# Patient Record
Sex: Female | Born: 1955
Health system: Southern US, Community
[De-identification: ages and names within clinical notes are randomized; demographics above are authoritative.]

## PROBLEM LIST (undated history)

## (undated) DIAGNOSIS — S42409A Unspecified fracture of lower end of unspecified humerus, initial encounter for closed fracture: Secondary | ICD-10-CM

## (undated) DIAGNOSIS — E785 Hyperlipidemia, unspecified: Secondary | ICD-10-CM

## (undated) DIAGNOSIS — R112 Nausea with vomiting, unspecified: Secondary | ICD-10-CM

## (undated) DIAGNOSIS — T8859XA Other complications of anesthesia, initial encounter: Secondary | ICD-10-CM

## (undated) DIAGNOSIS — I10 Essential (primary) hypertension: Secondary | ICD-10-CM

## (undated) DIAGNOSIS — N83209 Unspecified ovarian cyst, unspecified side: Secondary | ICD-10-CM

## (undated) DIAGNOSIS — N870 Mild cervical dysplasia: Secondary | ICD-10-CM

## (undated) DIAGNOSIS — R002 Palpitations: Secondary | ICD-10-CM

## (undated) DIAGNOSIS — Z9889 Other specified postprocedural states: Secondary | ICD-10-CM

## (undated) DIAGNOSIS — R42 Dizziness and giddiness: Secondary | ICD-10-CM

## (undated) DIAGNOSIS — T4145XA Adverse effect of unspecified anesthetic, initial encounter: Secondary | ICD-10-CM

## (undated) DIAGNOSIS — M1990 Unspecified osteoarthritis, unspecified site: Secondary | ICD-10-CM

## (undated) DIAGNOSIS — R55 Syncope and collapse: Secondary | ICD-10-CM

## (undated) HISTORY — DX: Mild cervical dysplasia: N87.0

## (undated) HISTORY — DX: Syncope and collapse: R55

## (undated) HISTORY — PX: COLPOSCOPY: SHX161

## (undated) HISTORY — PX: ABDOMINAL HYSTERECTOMY: SHX81

## (undated) HISTORY — DX: Unspecified ovarian cyst, unspecified side: N83.209

## (undated) HISTORY — DX: Hyperlipidemia, unspecified: E78.5

## (undated) HISTORY — PX: TUBAL LIGATION: SHX77

## (undated) HISTORY — DX: Palpitations: R00.2

## (undated) HISTORY — DX: Unspecified osteoarthritis, unspecified site: M19.90

## (undated) HISTORY — PX: OOPHORECTOMY: SHX86

## (undated) HISTORY — DX: Essential (primary) hypertension: I10

## (undated) HISTORY — PX: ABDOMINAL SURGERY: SHX537

## (undated) HISTORY — PX: GYNECOLOGIC CRYOSURGERY: SHX857

---

## 1998-09-06 ENCOUNTER — Emergency Department (HOSPITAL_COMMUNITY): Admission: EM | Admit: 1998-09-06 | Discharge: 1998-09-06 | Payer: Self-pay | Admitting: Emergency Medicine

## 2000-10-24 ENCOUNTER — Encounter: Payer: Self-pay | Admitting: Internal Medicine

## 2000-10-24 ENCOUNTER — Encounter: Admission: RE | Admit: 2000-10-24 | Discharge: 2000-10-24 | Payer: Self-pay | Admitting: Internal Medicine

## 2002-11-22 ENCOUNTER — Other Ambulatory Visit: Admission: RE | Admit: 2002-11-22 | Discharge: 2002-11-22 | Payer: Self-pay | Admitting: Obstetrics and Gynecology

## 2004-06-28 ENCOUNTER — Encounter: Admission: RE | Admit: 2004-06-28 | Discharge: 2004-08-27 | Payer: Self-pay | Admitting: Sports Medicine

## 2008-11-27 ENCOUNTER — Emergency Department (HOSPITAL_COMMUNITY): Admission: EM | Admit: 2008-11-27 | Discharge: 2008-11-28 | Payer: Self-pay | Admitting: Emergency Medicine

## 2011-01-09 ENCOUNTER — Encounter: Payer: Self-pay | Admitting: Sports Medicine

## 2011-10-21 ENCOUNTER — Ambulatory Visit (INDEPENDENT_AMBULATORY_CARE_PROVIDER_SITE_OTHER): Payer: BC Managed Care – PPO | Admitting: Women's Health

## 2011-10-21 ENCOUNTER — Encounter: Payer: Self-pay | Admitting: Women's Health

## 2011-10-21 ENCOUNTER — Other Ambulatory Visit (HOSPITAL_COMMUNITY)
Admission: RE | Admit: 2011-10-21 | Discharge: 2011-10-21 | Disposition: A | Discharge status: Home / Self Care | Payer: BC Managed Care – PPO | Source: Ambulatory Visit | Attending: Women's Health | Admitting: Women's Health

## 2011-10-21 VITALS — BP 110/64 | Ht 66.5 in | Wt 114.0 lb

## 2011-10-21 DIAGNOSIS — Z01419 Encounter for gynecological examination (general) (routine) without abnormal findings: Secondary | ICD-10-CM

## 2011-10-21 DIAGNOSIS — N952 Postmenopausal atrophic vaginitis: Secondary | ICD-10-CM

## 2011-10-21 DIAGNOSIS — N870 Mild cervical dysplasia: Secondary | ICD-10-CM | POA: Insufficient documentation

## 2011-10-21 DIAGNOSIS — N83209 Unspecified ovarian cyst, unspecified side: Secondary | ICD-10-CM | POA: Insufficient documentation

## 2011-10-21 MED ORDER — ESTRADIOL 10 MCG VA TABS: 10.0000 ug | ORAL_TABLET | VAGINAL | Status: DC

## 2011-10-21 NOTE — Progress Notes (Signed)
JAMIRAH ZELAYA [birth year] [id number]    History:    The patient presents for annual exam.  Teacher at [employer].   Past medical history, past surgical history, family history and social history were all reviewed and documented in the EPIC chart.   ROS:  A  ROS was performed and pertinent positives and negatives are included in the history.  Exam:  Filed Vitals:   10/21/11 1401  BP: 110/64    General appearance:  Normal Head/Neck:  Normal, without cervical or supraclavicular adenopathy. Thyroid:  Symmetrical, normal in size, without palpable masses or nodularity. Respiratory  Effort:  Normal  Auscultation:  Clear without wheezing or rhonchi Cardiovascular  Auscultation:  Regular rate, without rubs, murmurs or gallops  Edema/varicosities:  Not grossly evident Abdominal  Soft,nontender, without masses, guarding or rebound.  Liver/spleen:  No organomegaly noted  Hernia:  None appreciated  Skin  Inspection:  Grossly normal  Palpation:  Grossly normal Neurologic/psychiatric  Orientation:  Normal with appropriate conversation.  Mood/affect:  Normal  Genitourinary    Breasts: Examined lying and sitting.     Right: Without masses, retractions, discharge or axillary adenopathy.     Left: Without masses, retractions, discharge or axillary adenopathy.   Inguinal/mons:  Normal without inguinal adenopathy  External genitalia:  Normal  BUS/Urethra/Skene's glands:  Normal  Bladder:  Normal  Vagina:  Atrophic  Cervix:  Absent  Uterus:  Absent  Adnexa/parametria:     Rt: Without masses but with tenderness.   Lt: Without masses or tenderness.  Anus and perineum: Normal  Digital rectal exam: Normal sphincter tone without palpated masses or tenderness  Assessment/Plan:  55 y.o. MWF G0 for annual exam. Postmenopausal on no ERT with a complaint of vaginal dryness. Hysterectomy in 85, and oophorectomy in 89 for cysts. Bone density in January of 2011, states low end of normal, primary care  manages/exercise and vitamin D 2000 daily. Negative colonoscopy at age 33.  Vaginal atrophy  Plan: Options reviewed for atrophy will try Vagifem 1 applicator at bedtime for 2 weeks and then twice weekly.  Reviewed slight risk for blood clots and strokes, did review minimal systemic absorption. Encouraged lubricant for intercourse. SBEs, annual mammogram which she states have been normal, continue exercise, she is a runner, calcium rich diet, vitamin D 2000 daily encouraged. Pap only today, labs at primary care which she states were all normal.    [facility], 3:58 PM 10/21/2011

## 2013-01-27 ENCOUNTER — Ambulatory Visit (INDEPENDENT_AMBULATORY_CARE_PROVIDER_SITE_OTHER): Payer: BC Managed Care – PPO | Admitting: Family Medicine

## 2013-01-27 VITALS — BP 146/75 | HR 75 | Temp 98.6°F | Resp 18 | Ht 66.0 in | Wt 121.6 lb

## 2013-01-27 DIAGNOSIS — M531 Cervicobrachial syndrome: Secondary | ICD-10-CM

## 2013-01-27 DIAGNOSIS — M5481 Occipital neuralgia: Secondary | ICD-10-CM

## 2013-01-27 LAB — POCT SEDIMENTATION RATE: POCT SED RATE: 20 mm/hr (ref 0–22)

## 2013-01-27 MED ORDER — PREDNISONE 10 MG PO TABS: ORAL_TABLET | ORAL | Status: DC

## 2013-01-27 NOTE — Progress Notes (Signed)
Subjective:    Patient ID: Rita Berry, female    DOB: 09/16/1956, 57 y.o.   MRN: [id number] Chief Complaint  Patient presents with  . Headache    2 weeks  . Muscle Pain   HPI  Last Fri - 9d prev she fell asleep with the base of her right head on the tip of her husbands shoulder.   After she woke up she was had sharp shoulder pain and numbness in the back of her right ear which persisted x 2d. Then developed into severe point tenderness in right occiptal scalp radiating upwards - on fire.  Takes baby aspirin 81mg daily and icing.  She had prednisone 20mg  at home - and she cut it into tiny piece so estimates she took about 3 mg and it did improve some.  She has done a lot of research and is concerned she has occiptal neuralgia.  It is not a HA so much as it is point tenderness much worse to touch.  Takes a Production designer, theatre/television/film of lysine - to prevent shingles like outbreaks  bp checks at home daily 117/60s normally but she has white coat and prednisone increases.  Has a h/o BPPV  Past Medical History  Diagnosis Date  . CIN I (cervical intraepithelial neoplasia I)   . Ovarian cyst    Current Outpatient Prescriptions on File Prior to Visit  Medication Sig Dispense Refill  . Calcium-Magnesium-Vitamin D (CALCIUM MAGNESIUM PO) Take by mouth.        . Cholecalciferol (VITAMIN D PO) Take 2,000 Units by mouth.        . Multiple Vitamin (MULTIVITAMIN) tablet Take 1 tablet by mouth daily.        . Estradiol (VAGIFEM) 10 MCG TABS Place 1 tablet (10 mcg total) vaginally 2 (two) times a week.  8 tablet  12   No current facility-administered medications on file prior to visit.   Allergies  Allergen Reactions  . Codeine      Review of Systems  Constitutional: Negative for fever, chills, diaphoresis, appetite change and unexpected weight change.  HENT: Positive for neck pain and neck stiffness. Negative for hearing loss, ear pain, congestion, rhinorrhea, dental problem, sinus pressure, tinnitus and ear  discharge.   Eyes: Positive for photophobia. Negative for pain, discharge and visual disturbance.  Gastrointestinal: Negative for nausea and vomiting.  Musculoskeletal: Positive for myalgias. Negative for joint swelling and gait problem.  Skin: Negative for pallor, rash and wound.  Neurological: Positive for numbness and headaches. Negative for dizziness, tremors, seizures, syncope, facial asymmetry, speech difficulty, weakness and light-headedness.  Psychiatric/Behavioral: Positive for sleep disturbance.      BP 146/75  Pulse 75  Temp(Src) 98.6 F (37 C) (Oral)  Resp 18  Ht 5\' 6"  (1.676 m)  Wt 121 lb 9.6 oz (55.157 kg)  BMI 19.64 kg/m2  SpO2 96% Objective:   Physical Exam  Constitutional: She is oriented to person, place, and time. She appears well-developed and well-nourished. No distress.  HENT:  Head: Normocephalic and atraumatic.  Right Ear: External ear normal.  Left Ear: External ear normal.  Eyes: Conjunctivae are normal. No scleral icterus.  Neck: Normal range of motion. Neck supple. No thyromegaly present.  Cardiovascular: Normal rate, regular rhythm, normal heart sounds and intact distal pulses.   Pulmonary/Chest: Effort normal and breath sounds normal. No respiratory distress.  Musculoskeletal: She exhibits no edema.  Severe point tenderness immed anterior to right occipital groove on scalp  Lymphadenopathy:    She has  no cervical adenopathy.  Neurological: She is alert and oriented to person, place, and time.  Skin: Skin is warm and dry. She is not diaphoretic. No erythema.  Psychiatric: She has a normal mood and affect. Her behavior is normal.      Assessment & Plan:   1. Occipital neuralgia  POCT SEDIMENTATION RATE   POCT SEDIMENTATION RATE   predniSONE (DELTASONE) 10 MG tablet   Hopefully a one time thing from prolonged pressure against nerve but if recurs, rec referral to neurology to consider injections  Meds ordered this encounter  Medications  .  predniSONE (DELTASONE) 10 MG tablet    Sig: Take 4 tabs po qd x 2d, 3 tabs po qd x 2d, 2 tabs po qd x 2d, 1 tab po qd x 2d    Dispense:  20 tablet    Refill:  0

## 2013-01-27 NOTE — Patient Instructions (Addendum)
Occipital Neuralgia Neuralgias are attacks of sharp stabbing pain. They may be intermittent (comes and goes) or constant in nature. They may be brief attacks that last seconds to minutes and may come back for days to weeks. The neuralgias can occur as a result of a herpes zoster (shingles), chickenpox infection, or even following a herpes simplex infection (cold sore). TYPES OF NEURALGIA  When these pains are located in the back of the head and neck they are called occipital neuralgias.  When the pain is located between ribs it is called intercostal neuralgia.  When the pain is located in the face it is called trigeminal neuralgia. This is the most common neuralgia. It causes sharp, shock like pain on one side of your face. The neuralgias, which follow herpes zoster infections, often produce a constant burning pain. They may last from weeks to months and even years. The attacks of pain may come from injury or inflammation (irritation) to a nerve. Often the cause is unknown. The episodes of pain may be caused by light touch, movement, or even eating and sneezing. Usually these neuralgias occur after age forty. The neuralgias following shingles and trigeminal neuralgia are the most common. Although painful, these episodes do not threaten life and tend to lessen as we grow older. TREATMENT  There are many medications that may be helpful in the treatment of this disorder. Sometimes several medications may have to be tried before the right combination can be found for you. Some of these medications are:  Only take over-the-counter or prescription medications for pain, discomfort, or fever as directed by your caregiver.  Narcotic medications may be used to control the pain.  Antidepressants and medications used in epilepsy (seizure disorders) may be useful. LET YOUR CAREGIVER KNOW ABOUT:  If you do not obtain relief from medications.  Problems that are getting worse rather than better.  Troubling  side effects that you think are coming from the medication. Do not be discouraged if you do not obtain instant relief from the medications or help given you. Your caregiver can help you get through these episodes of pain with some persistence (continued trying) on your part also. Document Released: 11/29/2001 Document Revised: 02/27/2012 Document Reviewed: 12/05/2005 ExitCare Patient Information 2013 [employer].  

## 2013-02-02 ENCOUNTER — Other Ambulatory Visit: Payer: Self-pay

## 2013-04-18 ENCOUNTER — Telehealth: Payer: Self-pay

## 2013-04-18 DIAGNOSIS — Z1231 Encounter for screening mammogram for malignant neoplasm of breast: Secondary | ICD-10-CM

## 2013-04-18 DIAGNOSIS — M8580 Other specified disorders of bone density and structure, unspecified site: Secondary | ICD-10-CM

## 2013-04-18 DIAGNOSIS — E215 Disorder of parathyroid gland, unspecified: Secondary | ICD-10-CM

## 2013-04-18 NOTE — Telephone Encounter (Signed)
Patient would like referral to [facility] for a diagnostic mammogram and bone density test

## 2013-04-18 NOTE — Telephone Encounter (Signed)
You are listed as PCP can you do this?

## 2013-04-19 DIAGNOSIS — M8580 Other specified disorders of bone density and structure, unspecified site: Secondary | ICD-10-CM | POA: Insufficient documentation

## 2013-04-19 DIAGNOSIS — E213 Hyperparathyroidism, unspecified: Secondary | ICD-10-CM | POA: Insufficient documentation

## 2013-04-19 NOTE — Telephone Encounter (Signed)
Spoke to her, she is not having problems currently but 2 yrs ago she had abnormal mammogram, was told she needs diagnostic mammogram. Her last mammogram was normal. Please advise. Pended diagnostic mammogram

## 2013-04-19 NOTE — Telephone Encounter (Signed)
Called her, left message for her to call me back.  

## 2013-04-19 NOTE — Telephone Encounter (Addendum)
Please call this patient and find out why she needs a DIAGNOSTIC mammogram, rather than screening. I've ordered the DEXA.

## 2013-04-19 NOTE — Telephone Encounter (Signed)
Per the last mammogram (the most recent one in her paper chart is DIAGNOSTIC 07/07/2011, and was compared to 12/06/2010 and 12/23/2010), she was to have a SCREENING mammogram in 11/2011. Has she had a mammogram since 06/2011?  If not, this should be SCREENING.  Also, I note that her previous mammograms have been done at [facility], so we should schedule her there, and move the DEXA there, too.  Thanks.

## 2013-04-22 NOTE — Telephone Encounter (Signed)
No, not scheduled yet..patients last mammogram was 11/2011 she was advised this was good, has not gone yet. Pended the mammogram order and the bone density to be done together at [facility]

## 2013-04-22 NOTE — Telephone Encounter (Signed)
Where are we on this?

## 2013-05-24 ENCOUNTER — Ambulatory Visit: Payer: BC Managed Care – PPO

## 2013-05-24 ENCOUNTER — Ambulatory Visit (INDEPENDENT_AMBULATORY_CARE_PROVIDER_SITE_OTHER): Payer: BC Managed Care – PPO | Admitting: Family Medicine

## 2013-05-24 VITALS — BP 122/68 | HR 72 | Temp 97.7°F | Resp 16 | Ht 66.25 in | Wt 126.8 lb

## 2013-05-24 DIAGNOSIS — M25569 Pain in unspecified knee: Secondary | ICD-10-CM

## 2013-05-24 DIAGNOSIS — M899 Disorder of bone, unspecified: Secondary | ICD-10-CM

## 2013-05-24 DIAGNOSIS — M8580 Other specified disorders of bone density and structure, unspecified site: Secondary | ICD-10-CM

## 2013-05-24 DIAGNOSIS — M25562 Pain in left knee: Secondary | ICD-10-CM

## 2013-05-24 NOTE — Patient Instructions (Addendum)
1.  DECREASE LENGTH AND INTENSITY OF RUNNING FOR NEXT 2-4 WEEKS. 2.  ICE LEG TWICE DAILY.

## 2013-05-24 NOTE — Progress Notes (Signed)
[address]   [place], [place]  [address]   [phone]  Subjective:    Patient ID: Rita Berry, female    DOB: [birth year], 57 y.o.   MRN: [id number]  HPI This 57 y.o. female presents for evaluation of lower leg pain.  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  In 03/2010, was running 7 miles; jumped over a log; LLE injured; no evaluation; felt likely a stress fracture or significant fracture. History of recurrent stress fractures.  Wrapped leg, stopped running for several months after initial injury.  Rode bike entire summer instead of running.  School is out and has increased mileage recently; only running trails.  History of osteopenia.  Taking vitamin D; MVI; variety of calcium; supplement of calcium.  Almond milk.  Summer has just started.  Currently running 20-25 miles per week on trails only.  Does not run really hard.  Works at [employer] as Therapist, art.  Onset of worsening pain this week.  In same location as traumatic injury; can not reproduce pain with palpation or calf squeeze.  Impact of running makes pain worse.  Still wraps LLE: wears shin.  Feels like previous shin splints. Has had chronic LLE pain since traumatic injury three years ago.  Presenting for xray to evaluate for previous injury as well as acute stress fracture. No associated knee or ankle pain.  No foot pain.   Exercises daily.     Review of Systems  Constitutional: Negative for fever, chills, diaphoresis and fatigue.  Musculoskeletal: Positive for myalgias. Negative for back pain, joint swelling, arthralgias and gait problem.  Neurological: Negative for weakness and numbness.    Past Medical History  Diagnosis Date  . CIN I (cervical intraepithelial neoplasia I)   . Ovarian cyst     Past Surgical History  Procedure Laterality Date  . Gynecologic cryosurgery    . Colposcopy    . Abdominal hysterectomy    . Abdominal surgery      Ovarian cyst  .  Oophorectomy      BSO  . Tubal ligation      Prior to Admission medications   Medication Sig Start Date End Date Taking? Authorizing Provider  Calcium-Magnesium-Vitamin D (CALCIUM MAGNESIUM PO) Take by mouth.     Yes Historical Provider, MD  Cholecalciferol (VITAMIN D PO) Take 2,000 Units by mouth.     Yes Historical Provider, MD  Multiple Vitamin (MULTIVITAMIN) tablet Take 1 tablet by mouth daily.     Yes Historical Provider, MD    Allergies  Allergen Reactions  . Codeine     History   Social History  . Marital Status: Married    Spouse Name: N/A    Number of Children: N/A  . Years of Education: N/A   Occupational History  . Not on file.   Social History Main Topics  . Smoking status: Former Games developer  . Smokeless tobacco: Not on file  . Alcohol Use: 2.0 oz/week    4 drink(s) per week  . Drug Use: Not on file  . Sexually Active: Yes    Birth Control/ Protection: Surgical   Other Topics Concern  . Not on file   Social History Narrative  . No narrative on file    Family History  Problem Relation Age of Onset  . Hypertension Mother   . Hypertension Father   . Diabetes Sister   . Diabetes Maternal Grandmother   . Stroke  Maternal Grandmother   . Stroke Maternal Grandfather        Objective:   Physical Exam  Nursing note and vitals reviewed. Constitutional: She appears well-developed and well-nourished. No distress.  Musculoskeletal:       Left knee: Normal.       Left lower leg: Normal. She exhibits no tenderness, no bony tenderness, no swelling, no edema, no deformity and no laceration.  Skin: She is not diaphoretic.   [facility] reading (PRIMARY) by  Dr. Katrinka Blazing.  L tib-fib films: NAD     Assessment & Plan:  Pain in joint, lower leg, left - Plan: DG Tibia/Fibula Left  Osteopenia  1.  L lower leg pain:  Chronic with recent worsening; onset after fall/injury three years ago.  2.  Contusion L leg:  Chronic with recent worsening with increased exercise;  recommend rest, ice, elevate.  Pt declined rx for NSAID. Refer to sports medicine due to chronic nature of pain. 3.  Osteopenia: stable; scheduled for bone density in July 2014; history of recurrent stress fractures.

## 2013-05-29 ENCOUNTER — Ambulatory Visit: Payer: BC Managed Care – PPO | Admitting: Sports Medicine

## 2013-06-18 ENCOUNTER — Ambulatory Visit (INDEPENDENT_AMBULATORY_CARE_PROVIDER_SITE_OTHER): Payer: BC Managed Care – PPO | Admitting: Sports Medicine

## 2013-06-18 VITALS — BP 132/89 | Ht 67.0 in | Wt 125.0 lb

## 2013-06-18 DIAGNOSIS — R269 Unspecified abnormalities of gait and mobility: Secondary | ICD-10-CM

## 2013-06-18 NOTE — Assessment & Plan Note (Addendum)
Internal rotation of the tibias (L>R) Likely contributing to her h/o stress fractures. Focus on quad strengthening once a week. - nice to add some biking once per week and watch total run/walk time Work on not crossing the midline when running. / form drills advised Follow up as needed.  Follow RT hip for sxs - shows early signs of DJD on exam

## 2013-06-18 NOTE — Progress Notes (Signed)
Patient ID: Rita Berry, female   DOB: 28-Oct-1956, 57 y.o.   MRN: [id number] Subjective: Rita Berry is here for a new patient appointment for shin pain. Referred courtesy of Dr Rita Berry.  She reports that currently the pain has completely resolved.  2 years ago, she developed a stress fracture of her left shin.  She took 6-8 weeks off running and it improved.  She then developed shin splints on the right.  She wore a compression sleeve on the right, which improved the shin splint pain, so she got a second one to wear on the left as well.  She was been wearing the compression sleeves with any weight bearing activity for the last 1.5 years.  She describes continued left shin pain during this time frame.  She had an x-ray last month which was normal.  She stopped wearing the compression sleeves per the recommendation of her doctor and since then, she has been pain free.  Currently running 3 miles a day and walking an additional 5 miles.  PMHx: rickets as child PSHx: none FHx: none SHx: non smoker; occasional alcohol, denies drugs; works as professor of kinesiology  Medications: none Allergies: codeine  Objective: BP 132/89  Ht 5\' 7"  (1.702 m)  Wt 125 lb (56.7 kg)  BMI 19.57 kg/m2 Gen: alert, cooperative, NAD Shins: no tenderness to palpation, no erythema, warmth or swelling; slight internal rotation of tibia bilaterally Hip: Poor definition of bilateral vastus medius ROM Limited in rotation on right hip Strength Flexion: 5/5, Abduction: 5/5 Standing hip rotation and gait without trendelenburg sign / unsteadiness.  Gait: crossing of midline with left foot consistently with compensatory winging of left arm Consistent IR of Lt foot  A/P: 57 yo woman with history of rickets and stress fracture, found to have gait abnormality. See problem list for specifics.

## 2013-06-18 NOTE — Patient Instructions (Addendum)
Please do quad specific activity once a week. Every so often, watch how your feet move when you run.  You don't want to have your feet cross your body. Follow up if needed.

## 2013-06-21 ENCOUNTER — Encounter: Payer: Self-pay | Admitting: Physician Assistant

## 2013-06-25 ENCOUNTER — Encounter: Payer: Self-pay | Admitting: Physician Assistant

## 2013-06-27 ENCOUNTER — Ambulatory Visit: Payer: BC Managed Care – PPO | Admitting: Sports Medicine

## 2013-06-27 ENCOUNTER — Telehealth: Payer: Self-pay | Admitting: Radiology

## 2013-06-27 ENCOUNTER — Encounter: Payer: Self-pay | Admitting: Radiology

## 2013-06-27 NOTE — Telephone Encounter (Signed)
Called patient per Dr Katrinka Blazing mammogram normal.

## 2013-09-04 ENCOUNTER — Ambulatory Visit (INDEPENDENT_AMBULATORY_CARE_PROVIDER_SITE_OTHER): Payer: BC Managed Care – PPO | Admitting: Sports Medicine

## 2013-09-04 VITALS — BP 110/74 | Ht 67.0 in | Wt 128.0 lb

## 2013-09-04 DIAGNOSIS — M533 Sacrococcygeal disorders, not elsewhere classified: Secondary | ICD-10-CM

## 2013-09-04 NOTE — Progress Notes (Signed)
  Reason for visit: Right buttocks pain  Subjective: Patient is a very pleasant 57 year old female L runner who presents with right posterior hip and gluteal pain.  She states that she has had this pain for the last couple months.  She describes pain that is in her buttocks and posterior hip.  She actually thinks that the pain improves as she gets moving during her runs.  She has the most pain after she gets up from sitting or laying down for a long period of time.  She has tried bio frees as well as piriformis stretching.  She is not tried any medications.  She does ice every night.  She continues to run 4-5 miles 3 days per week and works out on the elliptical the other days.  She denies any radicular pain into her leg, numbness, weakness.  Review of systems as related to current complaint reviewed and unchanged.  Objective:The patient is well-developed well-nourished female and in no acute distress.  She is awake alert and oriented x3.  Extraocular motion is intact.  No use of accessory muscles for breathing.  No skin rash. Right hip:  - Full range of motion of the hip without pain. - Patient is noted to have asymmetry of her pelvis with the left hemipelvis 1 inch higher than the right.  Decreased motion and SI joint. - Weakness in hip abduction testing of gluteus maximus and gluteus medius. - Fabers test creates pain as well as decreased mobility on the right as compared to the left. - Pretzels stretch shows significant tightness of the right SI as compared to the left. - On gait testing patient has chicken winging of the right arm.  Assessment: #1.  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  Plan: Patient was given home exercises to do for SI joint mobilization as well as pelvic strengthening.  She was also given hip abduction and rotator strengthening exercises.  She was asked to focus on the pretzels stretch as well as strengthening.  She  may continue to run as tolerated.  She was also asked to practice her running form on a straight line with video taping to focus on form.  Followup in 6 weeks.

## 2013-09-04 NOTE — Patient Instructions (Signed)
Thank you for coming in today  1. Really work on pretzel stretch 2. Do low back and hip exercises 3. Work on running on a straight line and with videotaping 4. Followup 6 weeks if needed

## 2013-10-24 ENCOUNTER — Other Ambulatory Visit: Payer: Self-pay

## 2013-10-30 ENCOUNTER — Ambulatory Visit (INDEPENDENT_AMBULATORY_CARE_PROVIDER_SITE_OTHER): Payer: BC Managed Care – PPO | Admitting: Sports Medicine

## 2013-10-30 ENCOUNTER — Encounter: Payer: Self-pay | Admitting: Sports Medicine

## 2013-10-30 VITALS — Ht 67.0 in | Wt 125.0 lb

## 2013-10-30 DIAGNOSIS — M7632 Iliotibial band syndrome, left leg: Secondary | ICD-10-CM | POA: Insufficient documentation

## 2013-10-30 DIAGNOSIS — M629 Disorder of muscle, unspecified: Secondary | ICD-10-CM

## 2013-10-30 DIAGNOSIS — M76899 Other specified enthesopathies of unspecified lower limb, excluding foot: Secondary | ICD-10-CM

## 2013-10-30 DIAGNOSIS — R269 Unspecified abnormalities of gait and mobility: Secondary | ICD-10-CM

## 2013-10-30 DIAGNOSIS — M7062 Trochanteric bursitis, left hip: Secondary | ICD-10-CM | POA: Insufficient documentation

## 2013-10-30 DIAGNOSIS — M7061 Trochanteric bursitis, right hip: Secondary | ICD-10-CM | POA: Insufficient documentation

## 2013-10-30 MED ORDER — NITROGLYCERIN 0.2 MG/HR TD PT24: MEDICATED_PATCH | TRANSDERMAL | Status: DC

## 2013-10-30 NOTE — Assessment & Plan Note (Signed)
She is improving her gait  Continue to work on foot placement  Avoid turning the left foot in if possible

## 2013-10-30 NOTE — Progress Notes (Signed)
  Subjective:    Patient ID: Rita Berry, female    DOB: [birth year], 57 y.o.   MRN: [id number]  HPI  Pt presents to clinic for evaluation of lt lateral hip pain that has been painful for about 6 weeks.  Pain is the worst after prolonged activity and after sitting.  She is not sure if she hurt this with running or with some weight lifting activity Has a constant aching- 3/10. 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 Does running, walking, and strength training.  PHD in kinesiology  Doing IT band stretches which help.  Stretching after warming up for a run helps.   Of note is that her gait and right SI joint symptoms have improved a lot since we last saw her  she continues to do some specific exercises for her hip abduction and rotation  Past history is significant for parathyroid disease which is associated with increased risk of tendon and bursal injuries    Review of Systems     Objective:   Physical Exam No acute distress, thin and athletic  ttp over lt greater trochanter Weakness and some pain with testing the tensor fascia lata left only Left hip abduction was strong but cause mild pain Left hip extension with abduction cause pain and was mildly weak   70 deg ER lt hip 30 deg IR lt hip  Right hip has about 60 of external rotation and about 15 of internal rotation  Running gait is improved but she still turned to left foot in somewhat She has slight hesitance with hip flexion on the left  Musculoskeletal ultrasound There is a small spur at the left greater trochanter with hypoechoic fluid around this There seems to be bursal swelling under the attachment of the tensor fascia lata There is no significant swelling under the attachment of the gluteus medius or hip rotators There is mild swelling and bursal type appearance under the tendon of a gluteus maximus coming into the iliotibial band at the greater trochanter             Assessment & Plan:

## 2013-10-30 NOTE — Assessment & Plan Note (Signed)
this not painful enough for her to use injection  Ice massage a daily basis  Home exercise program  Rescanning in 2 months  Trial with nitroglycerin protocol

## 2013-10-30 NOTE — Assessment & Plan Note (Signed)
We gave her a series of strength exercises to focus on the muscles attaching to the iliotibial band  She will continue the stretches

## 2013-10-30 NOTE — Patient Instructions (Signed)
Do ice massages for 5-10 minutes over sore area of left hip twice daily  Straight leg extensions to the back, and to the back and out to the side  Side leg lifts lying on your side Flexion and abduction of your hip  Try nitroglycerin 1/4 patch over affected area  Nitroglycerin Protocol   Apply 1/4 nitroglycerin patch to affected area daily.  Change position of patch within the affected area every 24 hours.  You may experience a headache during the first 1-2 weeks of using the patch, these should subside.  If you experience headaches after beginning nitroglycerin patch treatment, you may take your preferred over the counter pain reliever.  Another side effect of the nitroglycerin patch is skin irritation or rash related to patch adhesive.  Please notify our office if you develop more severe headaches or rash, and stop the patch.  Tendon healing with nitroglycerin patch may require 12 to 24 weeks depending on the extent of injury.  Men should not use if taking Viagra, Cialis, or Levitra.   Do not use if you have migraines or rosacea.   Please follow up in 6 weeks  Thank you for seeing [place] today!

## 2013-11-12 ENCOUNTER — Telehealth: Payer: Self-pay | Admitting: *Deleted

## 2013-11-12 NOTE — Telephone Encounter (Signed)
Pt returned call.  For the 1st 10 days after starting NTG patch hip felt much better.  Went for a 6 mile run, and noticed hip pain worsened.  Advised pt she may have done too much activity too soon.  Suggested that she continue NTG patch, and ease back into activity.  Not going over a 3/10 on pain scale during activity.  Also asked pt to schedule a f/u with Dr. Darrick Penna as she did not have one set up yet.

## 2013-11-12 NOTE — Telephone Encounter (Signed)
Message copied by Mora Bellman on Tue Nov 12, 2013 12:01 PM ------      Message from: Lizbeth Bark      Created: Tue Nov 12, 2013  8:10 AM      Regarding: phone message      Contact: [phone]       Pt left vm asking for a call back to discuss nitroglycerin patch treatment.  Pt states they were working for about 10 days, now not so much. She has continued to use them.  ------

## 2013-11-12 NOTE — Telephone Encounter (Signed)
Returned pt's call & left VM.

## 2013-12-11 ENCOUNTER — Encounter: Payer: Self-pay | Admitting: Sports Medicine

## 2013-12-11 ENCOUNTER — Ambulatory Visit (INDEPENDENT_AMBULATORY_CARE_PROVIDER_SITE_OTHER): Payer: BC Managed Care – PPO | Admitting: Sports Medicine

## 2013-12-11 VITALS — BP 117/79 | Ht 67.0 in | Wt 130.0 lb

## 2013-12-11 DIAGNOSIS — M76899 Other specified enthesopathies of unspecified lower limb, excluding foot: Secondary | ICD-10-CM

## 2013-12-11 DIAGNOSIS — M7062 Trochanteric bursitis, left hip: Secondary | ICD-10-CM

## 2013-12-11 DIAGNOSIS — M7632 Iliotibial band syndrome, left leg: Secondary | ICD-10-CM

## 2013-12-11 DIAGNOSIS — M629 Disorder of muscle, unspecified: Secondary | ICD-10-CM

## 2013-12-11 NOTE — Assessment & Plan Note (Signed)
Minimal tenderness today and so we will follow this

## 2013-12-11 NOTE — Progress Notes (Signed)
Patient ID: Rita Berry, female   DOB: 01-Jun-1956, 57 y.o.   MRN: [id number]  Patient is now 6 weeks status post starting nitroglycerin protocol for left hip pain and tendinopathy causing bursal swelling along the greater trochanter of the left hip. She had involvement of all 3 muscles involving the iliotibial band.  She states that the first week the nitroglycerin caused headaches but then started getting pain relief and improvement. She states that the exercises and stretches were also painful for about one week and then became less painful and now she feels she can do them without pain.  She is able to run and feels that this is okay with less pain even when doing 5 miles or more  Physical examination  No acute distress BP 117/79  Ht 5\' 7"  (1.702 m)  Wt 130 lb (58.968 kg)  BMI 20.36 kg/m2  Hip: ROM  Rot of hips is about 70 deg bilat Strength IR: 5/5, ER: 5/5, Flexion: 5/5, Extension: 5/5, Abduction: 5/5, Adduction: 5/5 Pelvic alignment unremarkable to inspection and palpation. Standing hip rotation and gait without trendelenburg / unsteadiness. Greater trochanter without tenderness to palpation. No tenderness over piriformis and greater trochanter. No SI joint tenderness and normal minimal SI movement.  Weakness demonstrated - slight only gluteus maximus testing  Running gait She still internally rotates the left foot No real limp Form is steadily improving with less abnormal swelling of the arms

## 2013-12-11 NOTE — Assessment & Plan Note (Signed)
Excellent progress  We will continue nitroglycerin Continue exercise protocol Continue stretches  Okay to run  Recheck in March

## 2014-02-20 ENCOUNTER — Encounter: Payer: Self-pay | Admitting: Sports Medicine

## 2014-02-20 ENCOUNTER — Ambulatory Visit (INDEPENDENT_AMBULATORY_CARE_PROVIDER_SITE_OTHER): Payer: BC Managed Care – PPO | Admitting: Sports Medicine

## 2014-02-20 VITALS — BP 152/88 | HR 67 | Ht 67.0 in | Wt 130.0 lb

## 2014-02-20 DIAGNOSIS — M7632 Iliotibial band syndrome, left leg: Secondary | ICD-10-CM

## 2014-02-20 DIAGNOSIS — M7061 Trochanteric bursitis, right hip: Secondary | ICD-10-CM

## 2014-02-20 DIAGNOSIS — M76899 Other specified enthesopathies of unspecified lower limb, excluding foot: Secondary | ICD-10-CM

## 2014-02-20 DIAGNOSIS — M7062 Trochanteric bursitis, left hip: Secondary | ICD-10-CM

## 2014-02-20 DIAGNOSIS — M629 Disorder of muscle, unspecified: Secondary | ICD-10-CM

## 2014-02-20 MED ORDER — NITROGLYCERIN 0.2 MG/HR TD PT24: MEDICATED_PATCH | TRANSDERMAL | Status: DC

## 2014-02-20 NOTE — Assessment & Plan Note (Signed)
She is doing much better today. Continues to have minor symptoms. She will continue to use the nitroglycerin. She had this to the right side as well she is having a few symptoms on that side.

## 2014-02-20 NOTE — Progress Notes (Signed)
Patient ID: Rita Berry, female   DOB: 1956/05/30, 58 y.o.   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 Overall she is doing much better. She's been able to run without significant pain. Traits her pain at most 1/10 on the pain scale on the left side now. She's been using topical nitroglycerin since November for the symptoms. She has had mild pain and discomfort in the right is well. She actually states that the right is somewhat worse than the left today however at its most severe slowly 3/10 on the pain scale.  Interval past medical history unchanged from prior visits.  Review of systems as per history of present illness otherwise negative  Examination: BP 152/88  Pulse 67  Ht 5\' 7"  (1.702 m)  Wt 130 lb (58.968 kg)  BMI 20.36 kg/m2 Developed well-nourished 58 year old female awake alert and oriented no acute distress  Mild tenderness to palpation bilaterally over the greater trochanters. Hip abductor strength 5/5 to TFL, gluten medius and 4+ to 5/5 glut max  Gait:  Running markedly improved from prior visits.  Continues to have excessive left arm swing however less than past visits. No significant internal rotation of hips noted with running today.  Muscular skeletal ultrasound bilateral hips Images of the gluteus maximus, medius and tensor fascia lata insertion on the greater trochanter deep today bilaterally. No evidence of significant effusion bilaterally. There is some small bony spurring within the gluteus maximus tendons at their insertion onto the greater trochanter. When comparing to prior ultrasound this is a significant improvement.

## 2014-04-08 ENCOUNTER — Other Ambulatory Visit: Payer: Self-pay | Admitting: *Deleted

## 2014-04-08 MED ORDER — NITROGLYCERIN 0.2 MG/HR TD PT24: MEDICATED_PATCH | TRANSDERMAL | Status: DC

## 2014-04-18 ENCOUNTER — Encounter: Payer: Self-pay | Admitting: Podiatrist

## 2014-04-18 ENCOUNTER — Ambulatory Visit (INDEPENDENT_AMBULATORY_CARE_PROVIDER_SITE_OTHER): Payer: BC Managed Care – PPO | Admitting: Podiatrist

## 2014-04-18 ENCOUNTER — Ambulatory Visit (INDEPENDENT_AMBULATORY_CARE_PROVIDER_SITE_OTHER): Payer: BC Managed Care – PPO

## 2014-04-18 VITALS — BP 146/88 | HR 76 | Resp 18

## 2014-04-18 DIAGNOSIS — M722 Plantar fascial fibromatosis: Secondary | ICD-10-CM

## 2014-04-18 NOTE — Patient Instructions (Signed)
Take a wash cloth or small towel and roll it up-- place it under your toes to grip onto.  Then drop your heel off a step and slowly lower your heel down as far as you can and hold for 20 seconds.  Then elevate your heel up and hold for 20 seconds.  This will stretch and strengthen the muscles within your foot that support your plantar fascia.  Flex back your toes with your hand and massage the heel to milk out the swelling.      Plantar Fasciitis (Heel Spur Syndrome) with Rehab The plantar fascia is a fibrous, ligament-like, soft-tissue structure that spans the bottom of the foot. Plantar fasciitis is a condition that causes pain in the foot due to inflammation of the tissue. SYMPTOMS   Pain and tenderness on the underneath side of the foot.  Pain that worsens with standing or walking. CAUSES  Plantar fasciitis is caused by irritation and injury to the plantar fascia on the underneath side of the foot. Common mechanisms of injury include:  Direct trauma to bottom of the foot.  Damage to a small nerve that runs under the foot where the main fascia attaches to the heel bone. Stress placed on the plantar fascia due to any mild increased activity or injury RISK INCREASES WITH:   Obesity.  Poor strength and flexibility.  Improperly fitted shoes.  Tight calf muscles.  Flat feet.  Failure to warm-up properly before activity.  PREVENTION  Warm up and stretch properly before activity.  Strength, flexibility  Maintain a health body weight.  Avoid stress on the plantar fascia.  Wear properly fitted shoes, including arch supports for individuals who have flat feet. PROGNOSIS  If treated properly, then the symptoms of plantar fasciitis usually resolve without surgery. However, occasionally surgery is necessary. RELATED COMPLICATIONS   Recurrent symptoms that may result in a chronic condition.  Problems of the lower back that are caused by compensating for the injury, such as  limping.  Pain or weakness of the foot during push-off following surgery.  Chronic inflammation, scarring, and partial or complete fascia tear, occurring more often from repeated injections. TREATMENT  Treatment initially involves the use of ice and medication to help reduce pain and inflammation. The use of strengthening and stretching exercises may help reduce pain with activity, especially stretches of the Achilles tendon.  Your caregiver may recommend that you use arch supports to help reduce stress on the plantar fascia. Often, corticosteroid injections are given to reduce inflammation. If symptoms persist for greater than 6 months despite non-surgical (conservative), then surgery may be recommended.  MEDICATION   If pain medication is necessary, then nonsteroidal anti-inflammatory medications, such as aspirin and ibuprofen, or other minor pain relievers, such as acetaminophen, are often recommended. Corticosteroid injections may be given by your caregiver.  HEAT AND COLD  Cold treatment (icing) relieves pain and reduces inflammation. Cold treatment should be applied for 10 to 15 minutes every 2 to 3 hours for inflammation and pain and immediately after any activity that aggravates your symptoms. Use ice packs or massage the area with a piece of ice (ice massage).  Heat treatment may be used prior to performing the stretching and strengthening activities prescribed by your caregiver, physical therapist, or athletic trainer. Use a heat pack or soak the injury in warm water. SEEK IMMEDIATE MEDICAL CARE IF:  Treatment seems to offer no benefit, or the condition worsens.  Any medications produce adverse side effects.    EXERCISES-- perform  each exercise a total of 10-15 repetitions.  Hold for 30 seconds and perform 3 times per day   RANGE OF MOTION (ROM) AND STRETCHING EXERCISES - Plantar Fasciitis (Heel Spur Syndrome) These exercises may help you when beginning to rehabilitate your  injury.   While completing these exercises, remember:   Restoring tissue flexibility helps normal motion to return to the joints. This allows healthier, less painful movement and activity.  An effective stretch should be held for at least 30 seconds.  A stretch should never be painful. You should only feel a gentle lengthening or release in the stretched tissue. RANGE OF MOTION - Toe Extension, Flexion  Sit with your right / left leg crossed over your opposite knee.  Grasp your toes and gently pull them back toward the top of your foot. You should feel a stretch on the bottom of your toes and/or foot.  Hold this stretch for __________ seconds.  Now, gently pull your toes toward the bottom of your foot. You should feel a stretch on the top of your toes and or foot.  Hold this stretch for __________ seconds. Repeat __________ times. Complete this stretch __________ times per day.  RANGE OF MOTION - Ankle Dorsiflexion, Active Assisted  Remove shoes and sit on a chair that is preferably not on a carpeted surface.  Place right / left foot under knee. Extend your opposite leg for support.  Keeping your heel down, slide your right / left foot back toward the chair until you feel a stretch at your ankle or calf. If you do not feel a stretch, slide your bottom forward to the edge of the chair, while still keeping your heel down.  Hold this stretch for __________ seconds. Repeat __________ times. Complete this stretch __________ times per day.  STRETCH  Gastroc, Standing  Place hands on wall.  Extend right / left leg, keeping the front knee somewhat bent.  Slightly point your toes inward on your back foot.  Keeping your right / left heel on the floor and your knee straight, shift your weight toward the wall, not allowing your back to arch.  You should feel a gentle stretch in the right / left calf. Hold this position for __________ seconds. Repeat __________ times. Complete this  stretch __________ times per day. STRETCH  Soleus, Standing  Place hands on wall.  Extend right / left leg, keeping the other knee somewhat bent.  Slightly point your toes inward on your back foot.  Keep your right / left heel on the floor, bend your back knee, and slightly shift your weight over the back leg so that you feel a gentle stretch deep in your back calf.  Hold this position for __________ seconds. Repeat __________ times. Complete this stretch __________ times per day. STRETCH  Gastrocsoleus, Standing  Note: This exercise can place a lot of stress on your foot and ankle. Please complete this exercise only if specifically instructed by your caregiver.   Place the ball of your right / left foot on a step, keeping your other foot firmly on the same step.  Hold on to the wall or a rail for balance.  Slowly lift your other foot, allowing your body weight to press your heel down over the edge of the step.  You should feel a stretch in your right / left calf.  Hold this position for __________ seconds.  Repeat this exercise with a slight bend in your right / left knee. Repeat __________ times. Complete  this stretch __________ times per day.  STRENGTHENING EXERCISES - Plantar Fasciitis (Heel Spur Syndrome)  These exercises may help you when beginning to rehabilitate your injury. They may resolve your symptoms with or without further involvement from your physician, physical therapist or athletic trainer. While completing these exercises, remember:   Muscles can gain both the endurance and the strength needed for everyday activities through controlled exercises.  Complete these exercises as instructed by your physician, physical therapist or athletic trainer. Progress the resistance and repetitions only as guided.

## 2014-04-18 NOTE — Progress Notes (Signed)
   Subjective:    Patient ID: Rita Berry, female    DOB: 06-29-1956, 58 y.o.   MRN: [id number]  HPI I have heel pain on my left foot and it hurts on the bottom and have tried inserts and it helps some and feels like it is pulling    Review of Systems     Objective:   Physical Exam Patient is awake, alert, and oriented x 3.  In no acute distress.  Neurovascular status is intact with palpable pedal pulses at 2/4 DP and PT bilateral and capillary refill time within normal limits. Neurological sensation is also intact bilaterally both epicritically and protectively. Dermatological exam reveals skin color, turger and texture as normal. No open lesions present.   Musculoskeletal examination reveals good muscle, strength, tone and stability. Musculature intact with dorsiflexion, plantarflexion, inversion, eversion. Pain on palpation at the left heel is present at plantar medial side of the heel consistent with plantar fasciitis.  Pain is very mild and has just started.      Assessment & Plan:  Plantar fasciitis left  Plan:  Recommended conservative treatments of stretches and orthotics.  She will be seen back when inserts are ready for pick up

## 2014-05-09 ENCOUNTER — Other Ambulatory Visit: Payer: BC Managed Care – PPO

## 2014-05-16 ENCOUNTER — Other Ambulatory Visit: Payer: BC Managed Care – PPO

## 2014-05-16 ENCOUNTER — Ambulatory Visit (INDEPENDENT_AMBULATORY_CARE_PROVIDER_SITE_OTHER): Payer: BC Managed Care – PPO | Admitting: *Deleted

## 2014-05-16 DIAGNOSIS — M722 Plantar fascial fibromatosis: Secondary | ICD-10-CM

## 2014-05-16 NOTE — Progress Notes (Signed)
   Subjective:    Patient ID: Rita Berry, female    DOB: 03-04-1956, 58 y.o.   MRN: [id number]  HPI PICK UP ORTHOTICS AND GIVEN INSTRUCTION.   Review of Systems     Objective:   Physical Exam        Assessment & Plan:

## 2014-05-16 NOTE — Patient Instructions (Signed)

## 2014-05-27 ENCOUNTER — Encounter: Payer: Self-pay | Admitting: Sports Medicine

## 2014-05-27 ENCOUNTER — Ambulatory Visit (INDEPENDENT_AMBULATORY_CARE_PROVIDER_SITE_OTHER): Payer: BC Managed Care – PPO | Admitting: Sports Medicine

## 2014-05-27 VITALS — BP 137/83 | Ht 67.0 in | Wt 128.0 lb

## 2014-05-27 DIAGNOSIS — M7061 Trochanteric bursitis, right hip: Secondary | ICD-10-CM

## 2014-05-27 DIAGNOSIS — M7062 Trochanteric bursitis, left hip: Principal | ICD-10-CM

## 2014-05-27 DIAGNOSIS — R269 Unspecified abnormalities of gait and mobility: Secondary | ICD-10-CM

## 2014-05-27 DIAGNOSIS — M76899 Other specified enthesopathies of unspecified lower limb, excluding foot: Secondary | ICD-10-CM

## 2014-05-27 NOTE — Assessment & Plan Note (Signed)
This is much improved over her running form a year ago  I did suggest dynamic drills 2 add cutting  Reck PRN

## 2014-05-27 NOTE — Assessment & Plan Note (Signed)
This clinically appears to be healed She will wean nitroglycerin Keep up an exercise program that encourages hip strength  Continue to include some crosstraining

## 2014-05-27 NOTE — Progress Notes (Signed)
Patient ID: Rita Berry, female   DOB: 1956-07-12, 58 y.o.   MRN: [id number]  Followup of pain in the greater trochanteric region bilaterally  Able to run up to 9 miles after using aleve for 2 days Strength training 3 d wk Runs 4 d w Kick boxing/ aerobic class 2x w Water aerobics 2x w  Pain level is less than before She feels she is now able to train at her baseline Pain rarely keeps her from being able to do activities  She is averaging 2-3 hours of exercise daily  Physical exam Thin and in no acute distress BP 137/83  Ht 5\' 7"  (1.702 m)  Wt 128 lb (58.06 kg)  BMI 20.04 kg/m2  Normal hip range of motion bilaterally Excellent hip flexion and abduction strength Adduction strength excellent No pain to palpation  Running gait shows improved form She still likes to swing her right arm outward too much

## 2014-05-27 NOTE — Patient Instructions (Signed)
For running drills Once or twice per week add some zig zags for a 5 min warmdown  Add a few more side planks as that takes stress off hip  Wean NTG patches if pain is low  OK to do aleve give rest days as you are doing

## 2014-07-17 ENCOUNTER — Telehealth: Payer: Self-pay | Admitting: *Deleted

## 2014-07-17 NOTE — Telephone Encounter (Signed)
I called and left her a message that a second pair of orthotics will be $199.  Call if you have any further questions.

## 2014-07-17 NOTE — Telephone Encounter (Signed)
I got a pair of orthotics back in May.  I know my insurance probably won't pay for it but I'm interested in getting another pair.  How much will that cost me?

## 2014-09-16 ENCOUNTER — Other Ambulatory Visit: Payer: Self-pay | Admitting: Sports Medicine

## 2014-09-16 ENCOUNTER — Ambulatory Visit (INDEPENDENT_AMBULATORY_CARE_PROVIDER_SITE_OTHER): Payer: BC Managed Care – PPO | Admitting: Sports Medicine

## 2014-09-16 ENCOUNTER — Encounter: Payer: Self-pay | Admitting: Sports Medicine

## 2014-09-16 VITALS — BP 142/76 | Ht 67.0 in | Wt 128.0 lb

## 2014-09-16 DIAGNOSIS — M21619 Bunion of unspecified foot: Secondary | ICD-10-CM | POA: Diagnosis not present

## 2014-09-16 DIAGNOSIS — M25572 Pain in left ankle and joints of left foot: Secondary | ICD-10-CM

## 2014-09-16 DIAGNOSIS — M25579 Pain in unspecified ankle and joints of unspecified foot: Secondary | ICD-10-CM | POA: Diagnosis not present

## 2014-09-16 DIAGNOSIS — M21612 Bunion of left foot: Secondary | ICD-10-CM

## 2014-09-16 DIAGNOSIS — M21611 Bunion of right foot: Secondary | ICD-10-CM

## 2014-09-16 NOTE — Progress Notes (Signed)
Patient ID: Rita Berry, female   DOB: 02-26-1956, 58 y.o.   MRN: [id number]  Patient comes in for left foot pain that starts at the heel This has been present for 6 months and hasn't really resolved She uses a canvas arch strap She has done stretches and exercises Pain occurs primarily with too much activity She runs regularly but does appear well if she stays on softer surfaces like trails She has a custom orthotic but this doesn't completely relieve the pain  Examination No acute distress Left foot has a large bunion with very limited extension but normal flexion There is loss of the longitudinal arch There is loss of the transverse arch and some crossover of the left great toe over the second toe  Right foot has a smaller bunion There is also small adrenal and transverse arch breakdown but not as severe  Left heel does not show a lot of direct tenderness over the medial insertion of the plantar fascia but some along the membrane  Running gait reveals that she pronates a lot on the left and in toes/ crosses over midline

## 2014-09-16 NOTE — Assessment & Plan Note (Signed)
I felt a lot of this was triggered by the poor biomechanics of the left foot  We modified her orthotic with a first ray post  We added a metatarsal cookie  She felt some improvement while running once this was added  Continues on the plantar fascia exercises and stretches  If not improved consider adding a body helix ankle compression sleeve

## 2014-09-16 NOTE — Assessment & Plan Note (Signed)
These may be the trigger for abnormal gait with HR on Left

## 2014-10-03 ENCOUNTER — Encounter: Payer: Self-pay | Admitting: Podiatrist

## 2014-10-03 ENCOUNTER — Ambulatory Visit (INDEPENDENT_AMBULATORY_CARE_PROVIDER_SITE_OTHER): Payer: BC Managed Care – PPO | Admitting: Podiatrist

## 2014-10-03 ENCOUNTER — Ambulatory Visit: Payer: BC Managed Care – PPO | Admitting: Podiatrist

## 2014-10-03 VITALS — BP 154/94 | HR 66 | Resp 14

## 2014-10-03 DIAGNOSIS — M21612 Bunion of left foot: Secondary | ICD-10-CM

## 2014-10-03 DIAGNOSIS — M722 Plantar fascial fibromatosis: Secondary | ICD-10-CM

## 2014-10-03 DIAGNOSIS — M2012 Hallux valgus (acquired), left foot: Secondary | ICD-10-CM

## 2014-10-03 NOTE — Patient Instructions (Signed)
[facility]-- bunion splint

## 2014-10-06 ENCOUNTER — Telehealth: Payer: Self-pay | Admitting: *Deleted

## 2014-10-06 NOTE — Telephone Encounter (Signed)
Prescription sent for compound cream, Tendinosis Baclofen 2%, Diclofenac 3%, Verapamil 10% Quantity: 240 gm, 2 refills, allergic: Codeine

## 2014-10-07 NOTE — Progress Notes (Signed)
Chief Complaint  Patient presents with  . Plantar Fasciitis    "I have plantar fasciitis in my left foot, the orthotics only helped a little."  Pt states she saw [facility] Sports Medicine Dr. Darrick PennaFields and he said her bunion was so immobile it would not allow the foot to bend properly.  Dr. Darrick PennaFields placed padding  the shoe which pushed the bunion more in to the shoe.     HPI: Patient is 58 y.o. female who presents today for plantar fasciitis bilateral.  She had her orthotics adjusted slightly by Dr. Darrick PennaFields however she states the arch doesn't feel like it hugs into her foot like she would like.  She also states her bunion has caused her foot to not bend as much as it probably should.   Physical Exam  Neurovascular status is intact and unchanged. Bunion deformity noted right greater than left. Pain on palpation plantar medial aspect of bilateral heels is noted which is mild. Minimal neuroma symptomatology is noted which be worked on in the past and are giving her no trouble at today's visit.   Assessment: Plantar fasciitis bilateral  Plan: Plan recommended adjusting the height of the arch of the orthotics. I would like to all of a thumb to have her arch that her. Also recommended topical pain cream and this will be submitted for her. Discussed the bunion and it's correction and she would like to consider her options. At this time she would like to try a brace and we discussed one from [facility]. She is very active and would like to hold off on surgery as long as possible. No injection was given at today's visit however she knows it is an option in the future if she would like.

## 2014-10-20 ENCOUNTER — Telehealth: Payer: Self-pay | Admitting: *Deleted

## 2014-10-20 NOTE — Telephone Encounter (Signed)
We had received a prescription for a pain relief medication.  Unfortunately the original formula was not working for the patient.  Our pharmacist recommended she add Ketamine to the medication.  We need to get approval from the provider.  If you just give us a call back.

## 2014-10-20 NOTE — Telephone Encounter (Signed)
OK to add Ketamine  10% to the formulation.  You can give them a verbal approval or send over a written prescription with this information added.

## 2014-10-20 NOTE — Telephone Encounter (Signed)
I'm a patient of Dr. Irving ShowsEgerton.  She faxed over a prescription for me for [facility] in [place].  I got my first dose.  I just got the small one.  They called me last week for follow-up.  I was real hesitant and I wish I hadn't been.  That's why I'm calling today.  It's helping but it's certainly got a long way to go.  So, I was wondering.  I went ahead and ordered a larger dose of the same compound.  She offered to call last week and ask to add an ingredient and I was hesitant and I wish I hadn't been.  Here's what I'm wondering.  It's probably too late to change that big $60 compound that she shipped last week but I'm wondering if Dr. Irving ShowsEgerton will go along with them, mixing up a small dose of the compound with the controlled substance in it and maybe I can use it 1 time a day and use the other the other 3 times a day.  Sorry for the lengthy message, thank you.

## 2014-10-20 NOTE — Telephone Encounter (Signed)
Sure, I'm happy to write or approve for whatever she feels is working best.  [facility] pharmacists are very good at "tweaking" the compounds and there isn't any worry with the ones with the controlled substances in them.  So little is absorbed its a tiny amount that actually enters the system.

## 2014-10-21 NOTE — Telephone Encounter (Signed)
I called and left the patient a message that Dr. Dimas AguasEgerton okayed the tweak in the compound cream.  We're going to call and inform [facility].  I called Chrissy at [facility] and informed her that Dr. Dimas AguasEgerton okayed the addition of Ketamine.  "We'll take care of that for her.  Thanks for calling."

## 2014-10-28 ENCOUNTER — Telehealth: Payer: Self-pay | Admitting: *Deleted

## 2014-10-28 NOTE — Telephone Encounter (Signed)
I'm a patient of Dr. Irving ShowsEgerton.  I been using this new prescription from [facility].  It does help quite a bit.  I think they added Ketamine to it.  I think I'm still 50% where I would like to be pain wise.  I would like to know how she would feel about me doing an oral Prednisone dose pack.  Maybe that will help push it into the 10% pain area.  I know she indicated that if we do use an injection we could compromise strength.  I know, we been down that road.  If you would ask and let me know.  Thanks

## 2014-10-29 ENCOUNTER — Other Ambulatory Visit: Payer: Self-pay | Admitting: Podiatrist

## 2014-10-29 MED ORDER — METHYLPREDNISOLONE (PAK) 4 MG PO TABS: ORAL_TABLET | ORAL | Status: DC

## 2014-10-29 NOTE — Telephone Encounter (Signed)
Sure , that's fine if she would like to try a dose pack-- I'll call one in now.

## 2014-10-30 NOTE — Telephone Encounter (Signed)
I called and left her a message that Dr. Irving ShowsEgerton [facility]  Please call if you have any further questions.

## 2014-11-13 ENCOUNTER — Other Ambulatory Visit: Payer: Self-pay | Admitting: Sports Medicine

## 2014-12-15 ENCOUNTER — Encounter: Payer: Self-pay | Admitting: Sports Medicine

## 2014-12-15 ENCOUNTER — Ambulatory Visit (INDEPENDENT_AMBULATORY_CARE_PROVIDER_SITE_OTHER): Payer: BC Managed Care – PPO | Admitting: Sports Medicine

## 2014-12-15 VITALS — BP 147/92 | HR 69 | Ht 67.0 in | Wt 128.0 lb

## 2014-12-15 DIAGNOSIS — M722 Plantar fascial fibromatosis: Secondary | ICD-10-CM

## 2014-12-15 DIAGNOSIS — M7062 Trochanteric bursitis, left hip: Secondary | ICD-10-CM | POA: Diagnosis not present

## 2014-12-15 NOTE — Progress Notes (Signed)
Subjective:    Patient ID: Rita Berry, female    DOB: [birth year], 58 y.o.   MRN: [id number]  HPI chief complaint: Left foot and left hip pain  Very pleasant 58 year old female comes in today complaining of left heel and left hip pain. Left heel pain started about a year ago. She was diagnosed with plantar fasciitis and treated with plantar fascial stretches. Pain eventually resolved but returned several months ago. Pain is located primarily along the lateral aspect of the left calcaneus. It is worse with first step in the morning. She does experience some intermittent numbness and tingling. Pain also at times radiate into the arch of the foot. She is an avid runner and it has not interfered with her ability to continue running. She has been treated by both Dr. Darrick PennaFields as well as Dr. Donzetta KohutEdgerton (a local podiatrist). She has had custom orthotics made by podiatry in the past. These were modified with a first ray post and a metatarsal cookie by Dr. Darrick PennaFields a few months ago. In addition to plantar fasciitis she also has a history of a bunion deformity and the addition of the first ray post made this more uncomfortable for her. She therefore removed the first ray post from each of her orthotics. She recently saw podiatry and x-rays revealed no calcaneal spur. She was given a steroid Dosepak and notes that a dose as low as 2 mg helps significantly. She has also been prescribed a compounded topical pharmaceutical which is been of some benefit.  In regards to her left hip, she is complaining of lateral left hip pain and popping which is present primarily with bending forward at the waist. 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 She was also given a comprehensive hip strengthening program. as a result, her right hip pain resolved. Left hip pain has been present now for only a couple of weeks. she denies  groin pain. No radiculopathy. No recent trauma.   Interim medical history reviewed Current medications reviewed Allergies reviewed Social history reviewed    Review of Systems     Objective:   Physical Exam Well-developed, well-nourished. No acute distress. Awake alert and oriented 3. Vital signs reviewed.  Left foot in the standing position shows a cavus foot with collapse of the transverse arch. Prominent bunion deformity noted. There is tenderness to palpation along the lateral plantar aspect of the calcaneus but a negative calcaneal squeeze. No soft tissue swelling. No significant tenderness to palpation at the origin of the medial band of the plantar fascia. negative Tinel's at the tarsal tunnel . neurovascularly intact distally   Left hip: Tenderness to palpation directly over the left greater trochanteric bursa. Hip abductor weakness against resistance. Negative logroll. Full painless active and passive range of motion.  MSK ultrasound of the left heel was performed. It was a limited ultrasound of the plantar fascia. Images were compared to the uninvolved right heel. Left plantar fascia measures 0.5 cm in thickness which is a significant change when compared to the uninvolved left heel. Gross estimates are that it is about twice as thick as her left plantar fascia. No calcaneal spurring was seen. (Please note that the images were unable to be saved)        Assessment & Plan:  1. Left heel pain secondary to plantar fasciitis 2. Left hip pain secondary to greater trochanteric bursitis versus hip external rotator tendinopathy  Patient will resume daily plantar fascial stretching and daily ice water immersion. She may continue with all activity including running using pain as her guide. I think it may be best to start with new orthotics. She will return to the office in 3 weeks and my plan at that time will be to construct her some orthotics using [employer] technology. Since she has  had excellent results with topical nitroglycerin for left hip issues in the past she will start using a quarter patch over the left hip and couple this with daily hip strengthening exercises. Call with questions or concerns prior to her follow-up visit.

## 2014-12-16 ENCOUNTER — Other Ambulatory Visit: Payer: Self-pay | Admitting: *Deleted

## 2014-12-16 MED ORDER — NITROGLYCERIN 0.2 MG/HR TD PT24: MEDICATED_PATCH | TRANSDERMAL | Status: DC

## 2014-12-23 ENCOUNTER — Ambulatory Visit: Payer: BC Managed Care – PPO | Admitting: Sports Medicine

## 2014-12-30 ENCOUNTER — Ambulatory Visit (INDEPENDENT_AMBULATORY_CARE_PROVIDER_SITE_OTHER): Payer: BC Managed Care – PPO | Admitting: Sports Medicine

## 2014-12-30 ENCOUNTER — Encounter: Payer: Self-pay | Admitting: Sports Medicine

## 2014-12-30 VITALS — BP 143/86 | Ht 67.0 in | Wt 128.0 lb

## 2014-12-30 DIAGNOSIS — M722 Plantar fascial fibromatosis: Secondary | ICD-10-CM

## 2014-12-30 NOTE — Progress Notes (Signed)
Patient ID: Rita Berry, female   DOB: Sep 24, 1956, 59 y.o.   MRN: [id number]  Patient comes in today for orthotic instruction. She has a history of plantar fasciitis. Please see the office note from December 28th 2015 for details regarding history and physical exam findings.  Orthotics were constructed today using Investment banker, operationalsmart cell technology. She found them to be comfortable prior to leaving the office. She will continue with her plantar fascial stretching, daily icing and massage, and compression sleeve (she purchased this herself). Follow-up with me in 4-6 weeks for recheck.  Total of 30 minutes was spent with the patient with greater than 50% of the time spent in face-to-face consultation discussing orthotic instruction, instruction, and fitting.  Patient was fitted for a : standard, cushioned, semi-rigid orthotic. The orthotic was heated and afterward the patient stood on the orthotic blank positioned on the orthotic stand. The patient was positioned in subtalar neutral position and 10 degrees of ankle dorsiflexion in a weight bearing stance. After completion of molding, a stable base was applied to the orthotic blank. The blank was ground to a stable position for weight bearing. Size:9 (Smart cell) Base: none Posting: none Additional orthotic padding: none

## 2015-01-09 ENCOUNTER — Other Ambulatory Visit: Payer: Self-pay | Admitting: Sports Medicine

## 2015-01-27 ENCOUNTER — Ambulatory Visit (INDEPENDENT_AMBULATORY_CARE_PROVIDER_SITE_OTHER): Payer: BC Managed Care – PPO | Admitting: Sports Medicine

## 2015-01-27 ENCOUNTER — Encounter: Payer: Self-pay | Admitting: Sports Medicine

## 2015-01-27 VITALS — BP 134/83 | Ht 67.0 in | Wt 128.0 lb

## 2015-01-27 DIAGNOSIS — M25572 Pain in left ankle and joints of left foot: Secondary | ICD-10-CM

## 2015-01-27 DIAGNOSIS — M7061 Trochanteric bursitis, right hip: Secondary | ICD-10-CM

## 2015-01-27 DIAGNOSIS — M7062 Trochanteric bursitis, left hip: Secondary | ICD-10-CM | POA: Diagnosis not present

## 2015-01-27 NOTE — Assessment & Plan Note (Signed)
Left plantar fasciitis - Improving.  - Continue orthotics, stretching, and ice water bath.

## 2015-01-27 NOTE — Patient Instructions (Addendum)
Great to see you today!  This is still likely trochanteric bursitis. You can try the antiinflammatory cream on your hip for 1-2 weeks. If this works, great! If you are having irritation to the skin with the nitroglycerin, stop using that. If not helping, we can try an oral antiinflammatory on your hip. Continue exercises on your hip regularly to keep strength up. We will refer you to physical therapy for your hips, back, and core strengthening which is the mainstay of treatment for this pain.  Best.

## 2015-01-27 NOTE — Progress Notes (Signed)
Subjective:   CC: 4 week follow up of foot and hip pain  HPI:   F/u plantar fasciitis  Left heel pain that has gone down from 5/10 to 2/10. She has been doing ice water baths, stretches, [place] sock, and trying a compounded topical cream (she thinks is diclofenac, ketamine, gabapentin, and lidocaine). This is helping lots. She has also been wearing a soft brace on ankle/arch that she found online. She also got new orthotics 1/12 that she wears regularly.  F/u hip pain Patient had left>right hip pain thought secondary to trochanteric bursitis vs external rotator tendinopathy at her last visit. She has been using nitroglycerin quarter patches to each hip daily and working on strengthening exercises. She also tried one advil. Nothing is helping. 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 She thinks historically laying on the floor on air mattress has also worsened pain, making her wonder if back problems may be contributing and feels she needs to strengthen her back. She tried the compounded cream mentioned above once and this helped.   Review of Systems - Per HPI.  PMH: bilateral bunions, CIN I, greater trochanteric bursitis both hips, IT band syndrome L, osteopenia, left heel pain, parathyroid disease SH: Yoga instructor for elderly    Objective:  Physical Exam BP 134/83 mmHg GEN: NAD  EXTR:  Bilateral tenderness at greater trochanter, right>left, no erythema or swelling Hip ROM preserved Abductor strength 4/5 bilaterally  Left foot mild central/lateral heel tenderness Bilateral bunions Wearing soft ankle / arch sleeve Normal gait  Assessment:     Rita Berry is a 59 y.o. female here for f/u of left heel and bilateral hip pain.    Plan:     # See problem list and after visit summary for problem-specific plans.   Follow-up: Call in 1 week if no improvement with antiinflammatory topical  cream.   Leona SingletonMaria T Dontae Minerva, MD [facility]

## 2015-01-27 NOTE — Assessment & Plan Note (Addendum)
Trochanteric bursitis worsened with lying on that side, right>left - Can try analgesic compounded cream on her right hip (pt to call with medications that are in cream). - Call in 1 week if not improved; at that point, would try PO antiinflammatory x 1 week. If still not improved, can try steroid injection in clinic. - Discussed continuing hip exercises (abduction) and physical therapy - patient amenable for hip, back, and core strengthening. Referral ordered for Dr Erasmo DownerJohn O'Hallaran  Addendum: Patient called back to inform me that the compounded topical cream that she is using contains baclofen, diclofenac, gabapentin, ketamine, and tetracaine.

## 2015-02-05 ENCOUNTER — Telehealth: Payer: Self-pay | Admitting: *Deleted

## 2015-02-05 NOTE — Telephone Encounter (Signed)
Per Margaretha Sheffieldraper, OK to fill compounded topical cream (she thinks is diclofenac, ketamine, gabapentin, and lidocaine). Called pharmacy and pt still has active RFs left but they will RF

## 2015-02-06 ENCOUNTER — Ambulatory Visit (INDEPENDENT_AMBULATORY_CARE_PROVIDER_SITE_OTHER): Payer: BC Managed Care – PPO | Admitting: Family Medicine

## 2015-02-06 ENCOUNTER — Encounter: Payer: Self-pay | Admitting: Family Medicine

## 2015-02-06 VITALS — BP 145/82 | HR 69 | Temp 98.1°F | Resp 16 | Ht 66.5 in | Wt 132.0 lb

## 2015-02-06 DIAGNOSIS — J0101 Acute recurrent maxillary sinusitis: Secondary | ICD-10-CM

## 2015-02-06 MED ORDER — CEFUROXIME AXETIL 500 MG PO TABS: 500.0000 mg | ORAL_TABLET | Freq: Two times a day (BID) | ORAL | Status: DC

## 2015-02-06 NOTE — Patient Instructions (Signed)

## 2015-02-06 NOTE — Progress Notes (Signed)
Subjective:    Patient ID: Rita Berry, female    DOB: Sep 25, 1956, 59 y.o.   MRN: [id number]  HPI This 59 y.o. Female has a hx of recurrent sinus infections, last one > 5 years ago. She is here for eval of sinus pressure w/ HA and low grade fever, onset 3 weeks ago. Endorses "cold" before onset of presenting symptoms. Pt has been using saline nasal spray and symptomatic measures and is concerned about elevated BP.   Patient Active Problem List   Diagnosis Date Noted  . Pain in joint, ankle and foot 09/16/2014  . Bilateral bunions 09/16/2014  . Iliotibial band syndrome of left side 10/30/2013  . Greater trochanteric bursitis of both hips 10/30/2013  . Abnormality of gait 06/18/2013  . Parathyroid disease 04/19/2013  . Osteopenia 04/19/2013  . CIN I (cervical intraepithelial neoplasia I)   . Ovarian cyst     Prior to Admission medications   Medication Sig Start Date End Date Taking? Authorizing Provider  Multiple Vitamin (MULTIVITAMIN) tablet Take 1 tablet by mouth daily.     Yes Historical Provider, MD  nitroGLYCERIN (NITRODUR - DOSED IN MG/24 HR) 0.2 mg/hr patch APPLY 1/2 PATCH TO THE AFFECTED AREA DAILY, CHANGE EVERY 24 HOURS 01/12/15  Yes Enid Baas, MD  Calcium-Magnesium-Vitamin D (CALCIUM MAGNESIUM PO) Take by mouth.      Historical Provider, MD  Cholecalciferol (VITAMIN D PO) Take 2,000 Units by mouth.      Historical Provider, MD  ZOSTAVAX 40981 UNT/0.65ML injection  10/19/14   Historical Provider, MD   Past Surgical History  Procedure Laterality Date  . Gynecologic cryosurgery    . Colposcopy    . Abdominal hysterectomy    . Abdominal surgery      Ovarian cyst  . Oophorectomy      BSO  . Tubal ligation      SOC and FAM HX reviewed.  Review of Systems  Constitutional: Positive for fever and chills.  HENT: Positive for congestion, nosebleeds and sinus pressure. Negative for sore throat and trouble swallowing.   Respiratory: Positive for cough. Negative for chest  tightness.   Cardiovascular: Negative.   Neurological: Positive for headaches.      Objective:   Physical Exam  Constitutional: She is oriented to person, place, and time. She appears well-developed and well-nourished. No distress.  Blood pressure 145/82, pulse 69, temperature 98.1 F (36.7 C), temperature source Oral, resp. rate 16, height 5' 6.5" (1.689 m), weight 132 lb (59.875 kg), SpO2 98 %.   HENT:  Head: Normocephalic and atraumatic.  Right Ear: Hearing, external ear and ear canal normal. Tympanic membrane is not injected, not erythematous and not retracted. No middle ear effusion.  Left Ear: Hearing and external ear normal. Tympanic membrane is not injected, not erythematous and not retracted.  No middle ear effusion.  Nose: Mucosal edema present. No nasal deformity or septal deviation. Right sinus exhibits no frontal sinus tenderness. Left sinus exhibits maxillary sinus tenderness. Left sinus exhibits no frontal sinus tenderness.  Eyes: EOM and lids are normal. Pupils are equal, round, and reactive to light. Left conjunctiva is injected. No scleral icterus.  Neck: Normal range of motion. Neck supple. No thyroid mass and no thyromegaly present.  Cardiovascular: Normal rate, regular rhythm and normal heart sounds.   No murmur heard. Pulmonary/Chest: Effort normal and breath sounds normal. No respiratory distress.  Musculoskeletal: Normal range of motion.  Lymphadenopathy:    She has no cervical adenopathy.  Neurological: She is  alert and oriented to person, place, and time. No cranial nerve deficit.  Skin: Skin is warm and dry. She is not diaphoretic. No erythema. No pallor.  Psychiatric: She has a normal mood and affect. Her behavior is normal. Judgment and thought content normal.  Nursing note and vitals reviewed.      Assessment & Plan:  Acute recurrent maxillary sinusitis- RX: Cefuroxime 500 mg bid. Continue symptomatic measures; get OTC Coricidin.  RTC prn.  Meds ordered  this encounter  Medications  . cefUROXime (CEFTIN) 500 MG tablet    Sig: Take 1 tablet (500 mg total) by mouth 2 (two) times daily with a meal.    Dispense:  20 tablet    Refill:  0  '

## 2015-02-08 ENCOUNTER — Encounter: Payer: Self-pay | Admitting: Family Medicine

## 2015-02-12 ENCOUNTER — Telehealth: Payer: Self-pay

## 2015-02-12 ENCOUNTER — Ambulatory Visit (INDEPENDENT_AMBULATORY_CARE_PROVIDER_SITE_OTHER): Payer: BC Managed Care – PPO | Admitting: Physician Assistant

## 2015-02-12 VITALS — BP 140/78 | HR 70 | Temp 98.1°F | Resp 12 | Ht 66.5 in | Wt 133.0 lb

## 2015-02-12 DIAGNOSIS — IMO0001 Reserved for inherently not codable concepts without codable children: Secondary | ICD-10-CM

## 2015-02-12 DIAGNOSIS — J0101 Acute recurrent maxillary sinusitis: Secondary | ICD-10-CM

## 2015-02-12 DIAGNOSIS — R030 Elevated blood-pressure reading, without diagnosis of hypertension: Secondary | ICD-10-CM

## 2015-02-12 DIAGNOSIS — Z114 Encounter for screening for human immunodeficiency virus [HIV]: Secondary | ICD-10-CM

## 2015-02-12 LAB — COMPREHENSIVE METABOLIC PANEL
ALT: 13 U/L (ref 0–35)
AST: 22 U/L (ref 0–37)
Albumin: 4.5 g/dL (ref 3.5–5.2)
Alkaline Phosphatase: 48 U/L (ref 39–117)
BILIRUBIN TOTAL: 0.5 mg/dL (ref 0.2–1.2)
BUN: 11 mg/dL (ref 6–23)
CO2: 28 meq/L (ref 19–32)
CREATININE: 0.7 mg/dL (ref 0.50–1.10)
Calcium: 9.6 mg/dL (ref 8.4–10.5)
Chloride: 100 mEq/L (ref 96–112)
GLUCOSE: 94 mg/dL (ref 70–99)
Potassium: 5.4 mEq/L — ABNORMAL HIGH (ref 3.5–5.3)
Sodium: 135 mEq/L (ref 135–145)
Total Protein: 7.1 g/dL (ref 6.0–8.3)

## 2015-02-12 LAB — HIV ANTIBODY (ROUTINE TESTING W REFLEX): HIV: NONREACTIVE

## 2015-02-12 MED ORDER — CHLORTHALIDONE 15 MG PO TABS: 15.0000 mg | ORAL_TABLET | Freq: Every day | ORAL | Status: DC

## 2015-02-12 NOTE — Telephone Encounter (Signed)
Ok thanks, I called [facility] to change the Rx.to 25 mg.

## 2015-02-12 NOTE — Patient Instructions (Signed)
I will contact you with your lab results as soon as they are available.   If you have not heard from me in 2 weeks, please contact me.  The fastest way to get your results is to register for My Chart (see the instructions on the last page of this printout).   Monitor your blood pressure. If it's above 140/90, take a dose of the chorthalidone. It's a diuretic, so it's best if you take in the mornings.

## 2015-02-12 NOTE — Telephone Encounter (Signed)
[facility] at [phone]  Prioritized - patient seen today.   States the RX strength is not correct.   Please clarify  CHELLE

## 2015-02-12 NOTE — Progress Notes (Signed)
Subjective:   Patient ID: Rita Berry, female     DOB: December 29, 1955, 59 y.o.    MRN: [id number]  PCP: Azlan Hanway, PA-C  Chief Complaint  Patient presents with  . Follow-up    pt feels BP is elevated after URI - usual BP111/78     Outpatient Prescriptions Prior to Visit  Medication Sig Dispense Refill  . Calcium-Magnesium-Vitamin D (CALCIUM MAGNESIUM PO) Take by mouth.      . cefUROXime (CEFTIN) 500 MG tablet Take 1 tablet (500 mg total) by mouth 2 (two) times daily with a meal. 20 tablet 0  . Cholecalciferol (VITAMIN D PO) Take 2,000 Units by mouth.      . Multiple Vitamin (MULTIVITAMIN) tablet Take 1 tablet by mouth daily.      . nitroGLYCERIN (NITRODUR - DOSED IN MG/24 HR) 0.2 mg/hr patch APPLY 1/2 PATCH TO THE AFFECTED AREA DAILY, CHANGE EVERY 24 HOURS 30 patch 0  . ZOSTAVAX 40981 UNT/0.65ML injection   0   No facility-administered medications prior to visit.    Allergies  Allergen Reactions  . Codeine Nausea And Vomiting    Patient Active Problem List   Diagnosis Date Noted  . Pain in joint, ankle and foot 09/16/2014  . Bilateral bunions 09/16/2014  . Iliotibial band syndrome of left side 10/30/2013  . Greater trochanteric bursitis of both hips 10/30/2013  . Abnormality of gait 06/18/2013  . Parathyroid disease 04/19/2013  . Osteopenia 04/19/2013  . CIN I (cervical intraepithelial neoplasia I)   . Ovarian cyst     Family History  Problem Relation Age of Onset  . Hypertension Mother   . Dementia Mother   . Bradycardia Mother   . Hypertension Father   . Diabetes Sister     gestational  . Diabetes Maternal Grandmother   . Stroke Maternal Grandmother   . Stroke Maternal Grandfather     History   Social History  . Marital Status: Married    Spouse Name: Maricela Curet  . Number of Children: 0  . Years of Education: EdD   Occupational History  . Wellness Art gallery manager at [employer]   Social History Main Topics  . Smoking  status: Former Games developer  . Smokeless tobacco: Never Used  . Alcohol Use: 2.4 oz/week    4 Standard drinks or equivalent per week  . Drug Use: No  . Sexual Activity:    Partners: Male    Birth Control/ Protection: Surgical   Other Topics Concern  . Not on file   Social History Narrative   Completed Doctor of Education in Kinesiology from [employer] 04/2013.   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   Lives with her husband.     HPI  Presents concerned about her blood pressure. Would like to have a "very low dose diuretic" to take when it's elevated. Both her parents have HTN. She eats very healthfully and exercises daily.  Lifestyle is very active. No caffeine. Minimal salt.  Several weeks ago, had a "terrible cold."   Symptoms resolved for the most part, though she continued to have considerable dryness in the nose. Feeling "not quite right," she checked her BP at home and was distressed that it was 150/100. History of elevated BP due to sinusitis. Was seen here 02/06/15 and diagnosed with sinusitis and started on Ceftin. Hasn't seen improvement in the  BP readings (138/70 yesterday).  Checks BP several times/week. BP usually <120/68, so the readings above are concerning to her.  No HA, Dizziness, vision changes. No fever, chills. No GI/GU symptoms. No CP, SOB.  Review of Systems Review of Systems  Constitutional: Negative.   HENT: Positive for congestion (mild, mostly feels "dry" ). Negative for ear pain, nosebleeds and sore throat.   Eyes: Negative.   Respiratory: Negative.   Cardiovascular: Negative.   Gastrointestinal: Negative.   Genitourinary: Negative.   Musculoskeletal: Positive for joint pain (RIGHT hip-treated by Sports Medicine, currently using NTG patch). Negative for myalgias, back pain, falls and neck pain.  Skin: Negative.   Neurological: Negative for  dizziness, focal weakness, loss of consciousness and headaches.  Psychiatric/Behavioral: Negative.         Objective:  Physical Exam Physical Exam  Constitutional: She is oriented to person, place, and time. She appears well-developed and well-nourished. She is active and cooperative. No distress.  BP 140/78 mmHg  Pulse 70  Temp(Src) 98.1 F (36.7 C) (Oral)  Resp 12  Ht 5' 6.5" (1.689 m)  Wt 133 lb (60.328 kg)  BMI 21.15 kg/m2  SpO2 98%   HENT:  Head: Normocephalic and atraumatic.  Right Ear: Hearing, tympanic membrane, external ear and ear canal normal.  Left Ear: Hearing, tympanic membrane, external ear and ear canal normal.  Nose: Nose normal.  Mouth/Throat: Uvula is midline, oropharynx is clear and moist and mucous membranes are normal. Normal dentition.  Eyes: Conjunctivae, EOM and lids are normal. Pupils are equal, round, and reactive to light.  Neck: Full passive range of motion without pain and phonation normal. Neck supple. No thyroid mass and no thyromegaly present.  Cardiovascular: Normal rate, regular rhythm and normal heart sounds.   Pulses:      Radial pulses are 2+ on the right side, and 2+ on the left side.  Pulmonary/Chest: Effort normal and breath sounds normal.  Neurological: She is alert and oriented to person, place, and time.  Skin: Skin is warm, dry and intact.  Psychiatric: Her speech is normal and behavior is normal. Judgment and thought content normal. Her mood appears anxious. Her affect is not angry, not blunt, not labile and not inappropriate. Cognition and memory are normal. She does not exhibit a depressed mood.             Assessment & Plan:  1. Elevated BP She will complete the Ceftin for sinusitis. Monitor BP and take chlorthalidone only if BP >140/90. If she feels dizzy after taking it, reduce dose by half. Re-evaluate in 3-6 months, sooner if BP consistently elevated. - Comprehensive metabolic panel - chlorthalidone (HYGROTEN) 15 MG  tablet; Take 1 tablet (15 mg total) by mouth daily.  Dispense: 30 tablet; Refill: 3  2. Acute recurrent maxillary sinusitis Complete Ceftin.  3. Screening for HIV without presence of risk factors Per CDC recommendations. - HIV antibody   Fernande Brashelle S. Sanjuana Mruk, PA-C Physician Assistant-Certified [facility]

## 2015-02-12 NOTE — Telephone Encounter (Signed)
The pharmacy called and stated the lowest dose of this medication is 25 mg, can we change Rx?  chlorthalidone (HYGROTEN) 15 MG tablet [[id number]

## 2015-02-12 NOTE — Telephone Encounter (Signed)
Yes, OK to change to 25, but advise the patient to take 1/2 tablet instead of 1 whole tablet.

## 2015-02-12 NOTE — Telephone Encounter (Signed)
Patient called back about this.

## 2015-03-09 ENCOUNTER — Other Ambulatory Visit: Payer: Self-pay | Admitting: Sports Medicine

## 2015-03-30 ENCOUNTER — Ambulatory Visit: Payer: BC Managed Care – PPO | Admitting: Sports Medicine

## 2015-04-29 ENCOUNTER — Ambulatory Visit: Payer: BC Managed Care – PPO | Admitting: Sports Medicine

## 2015-05-10 ENCOUNTER — Other Ambulatory Visit: Payer: Self-pay | Admitting: Sports Medicine

## 2015-06-03 ENCOUNTER — Ambulatory Visit: Payer: BC Managed Care – PPO | Admitting: Sports Medicine

## 2015-06-12 ENCOUNTER — Ambulatory Visit (INDEPENDENT_AMBULATORY_CARE_PROVIDER_SITE_OTHER): Payer: BC Managed Care – PPO | Admitting: Physician Assistant

## 2015-06-12 ENCOUNTER — Other Ambulatory Visit: Payer: Self-pay | Admitting: Physician Assistant

## 2015-06-12 VITALS — BP 110/76 | HR 66 | Temp 98.4°F | Resp 16 | Ht 66.25 in | Wt 134.4 lb

## 2015-06-12 DIAGNOSIS — Z124 Encounter for screening for malignant neoplasm of cervix: Secondary | ICD-10-CM

## 2015-06-12 DIAGNOSIS — N870 Mild cervical dysplasia: Secondary | ICD-10-CM | POA: Diagnosis not present

## 2015-06-12 DIAGNOSIS — I10 Essential (primary) hypertension: Secondary | ICD-10-CM

## 2015-06-12 DIAGNOSIS — E215 Disorder of parathyroid gland, unspecified: Secondary | ICD-10-CM | POA: Diagnosis not present

## 2015-06-12 DIAGNOSIS — Z0000 Encounter for general adult medical examination without abnormal findings: Secondary | ICD-10-CM | POA: Diagnosis not present

## 2015-06-12 DIAGNOSIS — M8580 Other specified disorders of bone density and structure, unspecified site: Secondary | ICD-10-CM | POA: Diagnosis not present

## 2015-06-12 DIAGNOSIS — N951 Menopausal and female climacteric states: Secondary | ICD-10-CM | POA: Diagnosis not present

## 2015-06-12 DIAGNOSIS — Z1329 Encounter for screening for other suspected endocrine disorder: Secondary | ICD-10-CM

## 2015-06-12 DIAGNOSIS — K5790 Diverticulosis of intestine, part unspecified, without perforation or abscess without bleeding: Secondary | ICD-10-CM

## 2015-06-12 DIAGNOSIS — Z13220 Encounter for screening for lipoid disorders: Secondary | ICD-10-CM | POA: Diagnosis not present

## 2015-06-12 LAB — POCT URINALYSIS DIPSTICK
Bilirubin, UA: NEGATIVE
Blood, UA: NEGATIVE
Glucose, UA: NEGATIVE
Ketones, UA: NEGATIVE
LEUKOCYTES UA: NEGATIVE
NITRITE UA: NEGATIVE
Protein, UA: NEGATIVE
Spec Grav, UA: 1.015
Urobilinogen, UA: 0.2
pH, UA: 7

## 2015-06-12 LAB — CBC WITH DIFFERENTIAL/PLATELET
Basophils Absolute: 0.1 10*3/uL (ref 0.0–0.1)
Basophils Relative: 1 % (ref 0–1)
EOS ABS: 0 10*3/uL (ref 0.0–0.7)
Eosinophils Relative: 0 % (ref 0–5)
HEMATOCRIT: 41.9 % (ref 36.0–46.0)
Hemoglobin: 15 g/dL (ref 12.0–15.0)
Lymphocytes Relative: 30 % (ref 12–46)
Lymphs Abs: 1.5 10*3/uL (ref 0.7–4.0)
MCH: 32.8 pg (ref 26.0–34.0)
MCHC: 35.8 g/dL (ref 30.0–36.0)
MCV: 91.7 fL (ref 78.0–100.0)
MONOS PCT: 6 % (ref 3–12)
MPV: 9.1 fL (ref 8.6–12.4)
Monocytes Absolute: 0.3 10*3/uL (ref 0.1–1.0)
Neutro Abs: 3.2 10*3/uL (ref 1.7–7.7)
Neutrophils Relative %: 63 % (ref 43–77)
Platelets: 233 10*3/uL (ref 150–400)
RBC: 4.57 MIL/uL (ref 3.87–5.11)
RDW: 12.7 % (ref 11.5–15.5)
WBC: 5 10*3/uL (ref 4.0–10.5)

## 2015-06-12 LAB — LIPID PANEL
CHOL/HDL RATIO: 2.5 ratio
Cholesterol: 223 mg/dL — ABNORMAL HIGH (ref 0–200)
HDL: 89 mg/dL (ref >=46)
LDL CALC: 120 mg/dL — AB (ref 0–99)
Triglycerides: 71 mg/dL (ref <150)
VLDL: 14 mg/dL (ref 0–40)

## 2015-06-12 LAB — COMPREHENSIVE METABOLIC PANEL
ALBUMIN: 4.5 g/dL (ref 3.5–5.2)
ALT: 15 U/L (ref 0–35)
AST: 20 U/L (ref 0–37)
Alkaline Phosphatase: 42 U/L (ref 39–117)
BUN: 13 mg/dL (ref 6–23)
CHLORIDE: 99 meq/L (ref 96–112)
CO2: 32 meq/L (ref 19–32)
Calcium: 9.4 mg/dL (ref 8.4–10.5)
Creat: 0.68 mg/dL (ref 0.50–1.10)
Glucose, Bld: 100 mg/dL — ABNORMAL HIGH (ref 70–99)
POTASSIUM: 4.1 meq/L (ref 3.5–5.3)
SODIUM: 138 meq/L (ref 135–145)
Total Bilirubin: 0.8 mg/dL (ref 0.2–1.2)
Total Protein: 7.2 g/dL (ref 6.0–8.3)

## 2015-06-12 LAB — POCT UA - MICROSCOPIC ONLY
Bacteria, U Microscopic: NEGATIVE
CASTS, UR, LPF, POC: NEGATIVE
Crystals, Ur, HPF, POC: NEGATIVE
Epithelial cells, urine per micros: NEGATIVE
Mucus, UA: NEGATIVE
WBC, UR, HPF, POC: NEGATIVE
Yeast, UA: NEGATIVE

## 2015-06-12 LAB — TSH: TSH: 1.892 u[IU]/mL (ref 0.350–4.500)

## 2015-06-12 MED ORDER — ESTRADIOL 2 MG VA RING: 2.0000 mg | VAGINAL_RING | VAGINAL | Status: DC

## 2015-06-12 NOTE — Progress Notes (Signed)
Patient ID: Rita Berry, female    DOB: February 26, 1956, 59 y.o.   MRN: [id number]  PCP: Abbygail Willhoite, PA-C  Chief Complaint  Patient presents with  . Annual Exam    requesting to get thyroids check as well    Subjective:   HPI: Presents for Annual Wellness exam.  Hypertension is under control. She takes her BP medication as prescribed and tolerates it well.   Pt states that she was diagnosed with a parathyroid condition many years ago, but has not had it evaluated in a long time, and does not know exactly what was wrong with it. She would like to have her PTH levels checked today.   She also does not remember the last time she was screened for for thyroid conditions. She said her father has hypothyroidism and she wants to make sure that she doesn't have it. The only symptom of hypothyroidism that she is experiencing is fatigue, and pt states that may be because she has been very active lately or she feels fatigued because she is concerned that she has hypothyroidism.   Pt would also like her lipids evaluated. She says that she checked her lipids with an at-home kit this past weekend, and her triglycerides were >300, her total cholesterol was 190, her HDL was 58 ,and her LDL was "normal". She also does not remember the last time she has had her lipids checked by a doctor.   She has been experiencing vaginal dryness and dyspareunia for about 10 years, since she went through menipause. It has gotten to the point that she is not longer sexually active with her husband because of the pain. She had seen an Ob/Gyn, who prescribed her an estrogen suppository, but pt never used it because she was concerned about the increased risk of heart disease with using it.   Ophthalmology: Last seen December 2015, normal.  Dental: last seen January 2016. 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 Her insurance will only pay every 6 months, so she is  now planning to going see a periodontist instead in July, and follow up with them every 3 months.  Pap: Pt reports that she has not had a pap test in 4-5 years. She had a hysterectomy, and is under the impression that she still has part of her cervix.  Mammogram: Last done 06/18/2013, normal. She is aware that she is due for another on 06/19/2015 and plans on scheduling an appointment.  Dexa: Last done 06/18/2013, diagnosed osteopenia. She is aware that she is due for another on 06/19/2015 and plans on getting it done at the same time as her mammogram.  Colonoscopy: Last done 06/18/2013, found diverticuli and told to return every 3 years. Next due 06/18/2016.  Vaccines: UTD    Patient Active Problem List   Diagnosis Date Noted  . Essential hypertension 06/12/2015  . Pain in joint, ankle and foot 09/16/2014  . Bilateral bunions 09/16/2014  . Iliotibial band syndrome of left side 10/30/2013  . Greater trochanteric bursitis of both hips 10/30/2013  . Abnormality of gait 06/18/2013  . Parathyroid disease 04/19/2013  . Osteopenia 04/19/2013  . CIN I (cervical intraepithelial neoplasia I)   . Ovarian cyst     Past Medical History  Diagnosis Date  . CIN I (cervical intraepithelial neoplasia I)   . Ovarian cyst      Prior to Admission medications   Medication Sig Start Date End  Date Taking? Authorizing Provider  Calcium-Magnesium-Vitamin D (CALCIUM MAGNESIUM PO) Take by mouth.     Yes Historical Provider, MD  chlorthalidone (HYGROTEN) 15 MG tablet Take 1 tablet (15 mg total) by mouth daily. 02/12/15  Yes Denym Rahimi, PA-C  Cholecalciferol (VITAMIN D PO) Take 2,000 Units by mouth.     Yes Historical Provider, MD  Multiple Vitamin (MULTIVITAMIN) tablet Take 1 tablet by mouth daily.     Yes Historical Provider, MD  nitroGLYCERIN (NITRODUR - DOSED IN MG/24 HR) 0.2 mg/hr patch APPLY 1/2 PATCH ONTO SKIN EVERY 24 HOURS 05/12/15  Yes Stefanie Libel, MD    Allergies  Allergen Reactions  . Codeine Nausea And  Vomiting    Past Surgical History  Procedure Laterality Date  . Gynecologic cryosurgery    . Colposcopy    . Abdominal hysterectomy    . Abdominal surgery      Ovarian cyst  . Oophorectomy      BSO  . Tubal ligation      Family History  Problem Relation Age of Onset  . Hypertension Mother   . Dementia Mother   . Bradycardia Mother   . Hypertension Father   . Hyperlipidemia Father   . Diabetes Sister     gestational  . Diabetes Maternal Grandmother   . Stroke Maternal Grandmother   . Stroke Maternal Grandfather     History   Social History  . Marital Status: Married    Spouse Name: Netta Neat  . Number of Children: 0  . Years of Education: EdD   Occupational History  . Wellness Copywriter, advertising at [employer]  . Smoking status: Former Research scientist (life sciences)  . Smokeless tobacco: Never Used  . Alcohol Use: 2.4 oz/week    4 Standard drinks or equivalent per week  . Drug Use: No  . Sexual Activity:    Partners: Male    Birth Control/ Protection: Surgical   Other Topics Concern  . None   Social History Narrative   Completed Doctor of Education in Restaurant manager, fast food from [employer] 04/2013.   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   Lives with her husband.       Review of Systems Constitutional: Positive for fatigue. Negative for fever, chills, diaphoresis, activity change, appetite change and unexpected weight change.  HENT: Negative.  Eyes: Negative.  Respiratory: Negative.  Cardiovascular: Negative.  Gastrointestinal: Negative.  Endocrine: Negative.  Genitourinary: Positive for dyspareunia. Negative for dysuria, urgency, frequency, hematuria, flank pain, decreased urine volume, vaginal bleeding, vaginal discharge, enuresis, difficulty urinating, genital sores, vaginal pain, menstrual problem and pelvic pain.    Musculoskeletal: Negative.  Skin: Negative.  Allergic/Immunologic: Negative.  Neurological: Negative.  Hematological: Negative.  Psychiatric/Behavioral: Negative.       Objective:  Physical Exam  Constitutional: She is oriented to person, place, and time. Vital signs are normal. She appears well-developed and well-nourished. She is active and cooperative. No distress.  BP 110/76 mmHg  Pulse 66  Temp(Src) 98.4 F (36.9 C) (Oral)  Resp 16  Ht 5' 6.25" (1.683 m)  Wt 134 lb 6.4 oz (60.963 kg)  BMI 21.52 kg/m2  SpO2 96%   HENT:  Head: Normocephalic and atraumatic.  Right Ear: Hearing, tympanic membrane, external ear and ear canal normal. No foreign bodies.  Left Ear: Hearing, tympanic membrane, external ear and ear canal normal.  No foreign bodies.  Nose: Nose normal.  Mouth/Throat: Uvula is midline, oropharynx is clear and moist and mucous membranes are normal. No oral lesions. Normal dentition. No dental abscesses or uvula swelling. No oropharyngeal exudate.  Eyes: Conjunctivae, EOM and lids are normal. Pupils are equal, round, and reactive to light. Right eye exhibits no discharge. Left eye exhibits no discharge. No scleral icterus.  Fundoscopic exam:      The right eye shows no arteriolar narrowing, no AV nicking, no exudate, no hemorrhage and no papilledema.       The left eye shows no arteriolar narrowing, no AV nicking, no exudate, no hemorrhage and no papilledema.  Neck: Trachea normal, normal range of motion and full passive range of motion without pain. Neck supple. No spinous process tenderness and no muscular tenderness present. No thyroid mass and no thyromegaly present.  Cardiovascular: Normal rate, regular rhythm, normal heart sounds, intact distal pulses and normal pulses.   Pulmonary/Chest: Effort normal and breath sounds normal. She exhibits no tenderness and no retraction. Right breast exhibits no inverted nipple, no mass, no nipple discharge, no skin change and no  tenderness. Left breast exhibits no inverted nipple, no mass, no nipple discharge, no skin change and no tenderness. Breasts are symmetrical.  Abdominal: Soft. Normal appearance and bowel sounds are normal. She exhibits no distension and no mass. There is no hepatosplenomegaly. There is no tenderness. There is no rigidity, no rebound, no guarding, no CVA tenderness, no tenderness at McBurney's point and negative Murphy's sign. No hernia. Hernia confirmed negative in the right inguinal area and confirmed negative in the left inguinal area.  Genitourinary: Rectal exam shows no external hemorrhoid and no fissure. No breast swelling, tenderness, discharge or bleeding. Pelvic exam was performed with patient supine. No labial fusion. There is no rash, tenderness, lesion or injury on the right labia. There is no rash, tenderness, lesion or injury on the left labia. Cervix exhibits no motion tenderness, no discharge and no friability. Right adnexum displays no mass, no tenderness and no fullness. Left adnexum displays no mass, no tenderness and no fullness. There is tenderness in the vagina. No erythema or bleeding in the vagina. No foreign body around the vagina. No signs of injury around the vagina. No vaginal discharge found.  Very uncomfortable with pelvic exam. Was not able to tolerate small sized speculum. Pediatric speculum used to note pale and friable vaginal tissue with loss of rugae. No cervix visualized. No cervix palpable on bimanual exam, which was also very uncomfortable.  Musculoskeletal: She exhibits no edema or tenderness.       Cervical back: Normal.       Thoracic back: Normal.       Lumbar back: Normal.  Lymphadenopathy:       Head (right side): No tonsillar, no preauricular, no posterior auricular and no occipital adenopathy present.       Head (left side): No tonsillar, no preauricular, no posterior auricular and no occipital adenopathy present.    She has no cervical adenopathy.    She  has no axillary adenopathy.       Right: No inguinal and no supraclavicular adenopathy present.       Left: No inguinal and no supraclavicular adenopathy present.  Neurological: She is alert and oriented to person, place, and time. She has normal strength and normal reflexes. No cranial nerve deficit. She exhibits normal muscle tone. Coordination and gait normal.  Skin: Skin is warm, dry and intact. No rash noted.  She is not diaphoretic. No cyanosis or erythema. Nails show no clubbing.  Psychiatric: She has a normal mood and affect. Her speech is normal and behavior is normal. Judgment and thought content normal.           Assessment & Plan:  1. Annual physical exam Age appropriate anticipatory guidance provided.  2. Parathyroid disease Await result. - Parathyroid hormone, intact (no Ca)  3. Osteopenia Update DEXA. Update Vitamin D level. - DG Bone Density; Future - Vit D  25 hydroxy (rtn osteoporosis monitoring)  4. Essential hypertension Controlled. Continue current regimen. - CBC with Differential/Platelet - Comprehensive metabolic panel - POCT urinalysis dipstick - POCT UA - Microscopic Only  5. Screening for thyroid disorder - TSH  6. Screening for hyperlipidemia - Lipid panel  7. Screening for cervical cancer/CIN I (cervical intraepithelial neoplasia I) It's not clear that she is a candidate for cervical cancer screening. Trial of estrogen, as below. Consider re-examination after several months to see if cervix is visible. Also ask that she get records from her previous provider-if cervix was removed at her hysterectomy, this is a non-issue.  8. Diverticulosis of intestine without bleeding, unspecified intestinal tract location Repeat colonoscopy in 2017 as planned.  9. Vaginal dryness, menopausal Trial of estrogen. Discussed the risks associated with estrogen use, weighed against the benefits of life quality. - estradiol (ESTRING) 2 MG vaginal ring; Place 2 mg  vaginally every 3 (three) months. follow package directions  Dispense: 1 each; Refill: 3    Fara Chute, PA-C Physician Assistant-Certified [facility]

## 2015-06-12 NOTE — Progress Notes (Signed)
Patient ID: Rita Berry, female    DOB: [birth year], 59 y.o.   MRN: [id number]  PCP: JEFFERY,CHELLE, PA-C   Subjecti  HPI Pt is a 59 y/o female presenting to clinic for her annual physical exam.  Pt's hypertension is under control. She takes her BP medication as prescribed and tolerates it well.   Pt states that she was diagnosed with a parathyroid condition many years ago, but has not had it evaluated in a long time, and does not know exactly what was wrong with it. She would like to have her PTH levels checked today.   She also does not remember the last time she was screened for for thyroid conditions. She said her father has hypothyroidism and she wants to make sure that she doesn't have it. The only symptom of hypothyroidism that she is experiencing is fatigue, and pt states that may be because she has been very active lately or she feels fatigued because she is concerned that she has hypothyroidism.  Pt would also like her lipids evaluated. She says that she checked her lipids with an at-home kit this past weekend, and her triglycerides were >300, her total cholesterol was 190, her HDL was 58 ,and her LDL was "normal". She also does not remember the last time she has had her lipids checked by a doctor.   Pt states that she has been experiencing vaginal dryness and dyspareunia for about 10 years, since she went through menipause. It has gotten to the point that she is not longer sexually active with her husband because of the pain. She had seen an Ob/Gyn, who prescribed her an estrogen suppository, but pt never used it because she was concerned about the increased risk of heart disease with using it.    Ophthalmology: Last seen December 2015, normal. Dental: last seen January 2016. 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 Her insurance will only pay every 6 months, so she is now planning to going see a periodontist instead in  July, and follow up with them every 3 months. Pap: Pt reports that she has not had a pap test in 4-5 years. She had a hysterectomy, and is under the impression that she still has part of her cervix.  Mammogram: Last done 06/18/2013, normal. She is aware that she is due for another on 06/19/2015 and plans on scheduling an appointment. Dexa: Last done 06/18/2013, diagnosed osteopenia. She is aware that she is due for another on 06/19/2015 and plans on getting it done at the same time as her mammogram. Colonoscopy: Last done 06/18/2013, found diverticuli and told to return every 3 years. Next due 06/18/2016. Vaccines: UTD   Review of Systems  Constitutional: Positive for fatigue. Negative for fever, chills, diaphoresis, activity change, appetite change and unexpected weight change.  HENT: Negative.   Eyes: Negative.   Respiratory: Negative.   Cardiovascular: Negative.   Gastrointestinal: Negative.   Endocrine: Negative.   Genitourinary: Positive for dyspareunia. Negative for dysuria, urgency, frequency, hematuria, flank pain, decreased urine volume, vaginal bleeding, vaginal discharge, enuresis, difficulty urinating, genital sores, vaginal pain, menstrual problem and pelvic pain.  Musculoskeletal: Negative.   Skin: Negative.   Allergic/Immunologic: Negative.   Neurological: Negative.   Hematological: Negative.   Psychiatric/Behavioral: Negative.      Patient Active Problem List   Diagnosis Date Noted  . Essential hypertension 06/12/2015  . Diverticulosis 06/12/2015  . Pain in joint, ankle  and foot 09/16/2014  . Bilateral bunions 09/16/2014  . Iliotibial band syndrome of left side 10/30/2013  . Greater trochanteric bursitis of both hips 10/30/2013  . Abnormality of gait 06/18/2013  . Parathyroid disease 04/19/2013  . Osteopenia 04/19/2013  . CIN I (cervical intraepithelial neoplasia I)   . Ovarian cyst     Past Medical History  Diagnosis Date  . CIN I (cervical intraepithelial neoplasia I)    . Ovarian cyst     Prior to Admission medications   Medication Sig Start Date End Date Taking? Authorizing Provider  Calcium-Magnesium-Vitamin D (CALCIUM MAGNESIUM PO) Take by mouth.     Yes Historical Provider, MD  chlorthalidone (HYGROTEN) 15 MG tablet Take 1 tablet (15 mg total) by mouth daily. 02/12/15  Yes Chelle Jeffery, PA-C  Cholecalciferol (VITAMIN D PO) Take 2,000 Units by mouth.     Yes Historical Provider, MD  Multiple Vitamin (MULTIVITAMIN) tablet Take 1 tablet by mouth daily.     Yes Historical Provider, MD  nitroGLYCERIN (NITRODUR - DOSED IN MG/24 HR) 0.2 mg/hr patch APPLY 1/2 PATCH ONTO SKIN EVERY 24 HOURS 05/12/15  Yes Stefanie Libel, MD    Allergies  Allergen Reactions  . Codeine Nausea And Vomiting    Past Medical, Surgical Family and Social History reviewed and updated.   Objective:   Vitals: BP 110/76 mmHg  Pulse 66  Temp(Src) 98.4 F (36.9 C) (Oral)  Resp 16  Ht 5' 6.25" (1.683 m)  Wt 134 lb 6.4 oz (60.963 kg)  BMI 21.52 kg/m2  SpO2 96%   Physical Exam  Constitutional: She is oriented to person, place, and time. She appears well-developed and well-nourished. She is cooperative. No distress.  HENT:  Head: Normocephalic and atraumatic.  Right Ear: Hearing, external ear and ear canal normal.  Left Ear: Hearing, tympanic membrane, external ear and ear canal normal.  Nose: Nose normal. Right sinus exhibits no maxillary sinus tenderness and no frontal sinus tenderness. Left sinus exhibits no maxillary sinus tenderness and no frontal sinus tenderness.  Mouth/Throat: Uvula is midline, oropharynx is clear and moist and mucous membranes are normal.  Unable to visualize right TM d/t cerumen impaction.  Eyes: Conjunctivae, EOM and lids are normal. Pupils are equal, round, and reactive to light.  Fundoscopic exam:      The right eye shows red reflex.       The left eye shows red reflex.  Neck: Trachea normal, normal range of motion and phonation normal. Neck  supple. No spinous process tenderness and no muscular tenderness present. No thyroid mass and no thyromegaly present.  Cardiovascular: Normal rate, regular rhythm, normal heart sounds and intact distal pulses.  Exam reveals no gallop and no friction rub.   No murmur heard. Pulmonary/Chest: Effort normal and breath sounds normal. No respiratory distress. She has no wheezes. She has no rales. Right breast exhibits no inverted nipple, no mass, no nipple discharge, no skin change and no tenderness. Left breast exhibits no inverted nipple, no mass, no nipple discharge, no skin change and no tenderness. Breasts are symmetrical.  Abdominal: Soft. Normal appearance and bowel sounds are normal. She exhibits no distension. There is no tenderness. There is no rigidity, no rebound, no guarding and no CVA tenderness.  Genitourinary: No labial fusion. There is no rash, tenderness, lesion or injury on the right labia. There is no rash, tenderness, lesion or injury on the left labia.  Pelvic exam performed. Pt experienced pain with exam, even when using the pediatric speculum. Vaginal  tissue was friable. Cervix could not be visualized or palpated on manual exam.   Musculoskeletal:       Right hip: Normal.       Left hip: Normal.       Cervical back: Normal.       Thoracic back: She exhibits no tenderness, no bony tenderness, no swelling, no edema, no deformity and no pain.       Lumbar back: She exhibits no tenderness, no bony tenderness, no swelling, no edema, no deformity and no pain.  Lymphadenopathy:       Head (right side): No submental, no submandibular, no tonsillar, no preauricular, no posterior auricular and no occipital adenopathy present.       Head (left side): No submental, no submandibular, no tonsillar, no preauricular, no posterior auricular and no occipital adenopathy present.    She has no cervical adenopathy.    She has no axillary adenopathy.  Neurological: She is alert and oriented to  person, place, and time. She has normal strength and normal reflexes.  Skin: Skin is warm, dry and intact. No rash noted.  Psychiatric: She has a normal mood and affect. Her speech is normal and behavior is normal. Thought content normal.    Assessment & Plan:   Deanza was seen today for annual exam.  Diagnoses and all orders for this visit:  Annual physical exam       -     See below.       -     Health maintenance reviewed and discussed with patient.  Parathyroid disease -     Await lab results. Orders: -     Parathyroid hormone, intact (no Ca)  Osteopenia -     Await lab results.  -     Referred for DEXA scan -     Continue current Ca and Vit D supplementation. Orders: -     Vitamin D 25 hydroxy -     DG Bone Density; Future  Essential hypertension -     Well controlled -      Continue current regimen. Orders: -     CBC with Differential/Platelet -     Comprehensive metabolic panel -     POCT urinalysis dipstick -     POCT UA - Microscopic Only  Screening for thyroid disorder -   Await lab results Orders: -     TSH  Screening for hyperlipidemia -     Await lab results. Orders: -     Lipid panel  Screening for cervical cancer CIN I (cervical intraepithelial neoplasia I) -     Pt experience significant discomfort on pelvic exam, even when using the pediatric speculum. -     Unable to visualize cervix or palpate cervix on manual exam, and vaginal walls friable. -     Unable to obtain a Pap specimen -     Consider trying again after pt has been on estrogen for several months. Orders: -     Cancel: Pap IG and HPV (high risk) DNA detection  Vaginal dryness, menopausal -     Causing pt significant discomfort on pelvic exam. -     Try estrogen vaginal ring. Orders: -     estradiol (ESTRING) 2 MG vaginal ring; Place 2 mg vaginally every 3 (three) months. follow package directions   Tayen Narang, PA-S [facility] 06/12/2015 1:07 PM

## 2015-06-12 NOTE — Patient Instructions (Addendum)
I will contact you with your lab results as soon as they are available.   If you have not heard from me in 2 weeks, please contact me.  The fastest way to get your results is to register for My Chart (see the instructions on the last page of this printout).  Keeping You Healthy  Get These Tests  Blood Pressure- Have your blood pressure checked by your healthcare provider at least once a year.  Normal blood pressure is 120/80.  Weight- Have your body mass index (BMI) calculated to screen for obesity.  BMI is a measure of body fat based on height and weight.  You can calculate your own BMI at www.nhlbisupport.com/bmi/  Cholesterol- Have your cholesterol checked every year.  Diabetes- Have your blood sugar checked every year if you have high blood pressure, high cholesterol, a family history of diabetes or if you are overweight.  Pap Test - Have a pap test every 1 to 5 years if you have been sexually active.  If you are older than 65 and recent pap tests have been normal you may not need additional pap tests.  In addition, if you have had a hysterectomy  for benign disease additional pap tests are not necessary.  Mammogram-Yearly mammograms are essential for early detection of breast cancer  Screening for Colon Cancer- Colonoscopy starting at age 50. Screening may begin sooner depending on your family history and other health conditions.  Follow up colonoscopy as directed by your Gastroenterologist.  Screening for Osteoporosis- Screening begins at age 65 with bone density scanning, sooner if you are at higher risk for developing Osteoporosis.  Get these medicines  Calcium with Vitamin D- Your body requires 1200-1500 mg of Calcium a day and 800-1000 IU of Vitamin D a day.  You can only absorb 500 mg of Calcium at a time therefore Calcium must be taken in 2 or 3 separate doses throughout the day.  Hormones- Hormone therapy has been associated with increased risk for certain cancers and heart  disease.  Talk to your healthcare provider about if you need relief from menopausal symptoms.  Aspirin- Ask your healthcare provider about taking Aspirin to prevent Heart Disease and Stroke.  Get these Immuniztions  Flu shot- Every fall  Pneumonia shot- Once after the age of 65; if you are younger ask your healthcare provider if you need a pneumonia shot.  Tetanus- Every ten years.  Zostavax- Once after the age of 60 to prevent shingles.  Take these steps  Don't smoke- Your healthcare provider can help you quit. For tips on how to quit, ask your healthcare provider or go to www.smokefree.gov or call 1-800 QUIT-NOW.  Be physically active- Exercise 5 days a week for a minimum of 30 minutes.  If you are not already physically active, start slow and gradually work up to 30 minutes of moderate physical activity.  Try walking, dancing, bike riding, swimming, etc.  Eat a healthy diet- Eat a variety of healthy foods such as fruits, vegetables, whole grains, low fat milk, low fat cheeses, yogurt, lean meats, chicken, fish, eggs, dried beans, tofu, etc.  For more information go to www.thenutritionsource.org  Dental visit- Brush and floss teeth twice daily; visit your dentist twice a year.  Eye exam- Visit your Optometrist or Ophthalmologist yearly.  Drink alcohol in moderation- Limit alcohol intake to one drink or less a day.  Never drink and drive.  Depression- Your emotional health is as important as your physical health.  If you're   feeling down or losing interest in things you normally enjoy, please talk to your healthcare provider.  Seat Belts- can save your life; always wear one  Smoke/Carbon Monoxide detectors- These detectors need to be installed on the appropriate level of your home.  Replace batteries at least once a year.  Violence- If anyone is threatening or hurting you, please tell your healthcare provider.  Living Will/ Health care power of attorney- Discuss with your  healthcare provider and family.  We recommend that you schedule a mammogram for breast cancer screening. Typically, you do not need a referral to do this. Please contact a local imaging center to schedule your mammogram.  [facility] - [phone]  *ask for the Radiology Department [facility]) - [phone] or [phone]  [facility] - [phone] [facility] - [phone] [facility] - [phone]  *ask for the Radiology Department [facility] - [phone]  *ask for the Radiology Department [facility] - [phone]  *ask for the Mammography Department [facility] - [phone]

## 2015-06-13 LAB — VITAMIN D 25 HYDROXY (VIT D DEFICIENCY, FRACTURES): Vit D, 25-Hydroxy: 40 ng/mL (ref 30–100)

## 2015-06-15 ENCOUNTER — Telehealth: Payer: Self-pay

## 2015-06-15 DIAGNOSIS — E215 Disorder of parathyroid gland, unspecified: Secondary | ICD-10-CM

## 2015-06-15 LAB — PARATHYROID HORMONE, INTACT (NO CA)

## 2015-06-15 NOTE — Telephone Encounter (Signed)
Chelle, There was a mistake with the PTH. Do you want me to have pt RTC for a re-draw at no charge?

## 2015-06-16 ENCOUNTER — Other Ambulatory Visit: Payer: BC Managed Care – PPO | Admitting: Radiology

## 2015-06-16 ENCOUNTER — Telehealth: Payer: Self-pay | Admitting: Radiology

## 2015-06-16 NOTE — Telephone Encounter (Signed)
Yes, please. She specifically requested this test.

## 2015-06-16 NOTE — Telephone Encounter (Signed)
Pt called and stated that the estrogen ring that was rx'd to her was $200 and it is too expensive. She would like to know if there is anything else that can be called in for her.

## 2015-06-16 NOTE — Telephone Encounter (Signed)
Future order for PTH ordered. Pt will RTC today. Do you mind releasing her other labs to MyChart please. Thanks

## 2015-06-16 NOTE — Addendum Note (Signed)
Addended byLevon Hedger: Sheppard Luckenbach A on: 06/16/2015 05:30 PM   Modules accepted: Orders

## 2015-06-17 ENCOUNTER — Encounter: Payer: Self-pay | Admitting: Physician Assistant

## 2015-06-17 LAB — PARATHYROID HORMONE, INTACT (NO CA): PTH: 82 pg/mL — ABNORMAL HIGH (ref 14–64)

## 2015-06-17 NOTE — Telephone Encounter (Signed)
I recommend that she check with her prescription benefit plan and find out which estrogen product(s) are preferred. I'll then order one of those!

## 2015-06-18 MED ORDER — ESTROGENS, CONJUGATED 0.625 MG/GM VA CREA: 1.0000 | TOPICAL_CREAM | Freq: Every day | VAGINAL | Status: DC

## 2015-06-18 NOTE — Telephone Encounter (Signed)
Meds ordered this encounter  Medications  . conjugated estrogens (PREMARIN) vaginal cream    Sig: Place 1 Applicatorful vaginally daily.    Dispense:  42.5 g    Refill:  12    Order Specific Question:  Supervising Provider    Answer:  Merla RichesOLITTLE, ROBERT P [[id number]]

## 2015-06-18 NOTE — Telephone Encounter (Signed)
Vagifem 40.00 or Premarin cream is preferred.

## 2015-06-18 NOTE — Telephone Encounter (Signed)
Left message letting pt know. 

## 2015-06-18 NOTE — Addendum Note (Signed)
Addended by: Fernande BrasJEFFERY, Vontrell Pullman S on: 06/18/2015 02:04 PM   Modules accepted: Orders

## 2015-06-18 NOTE — Telephone Encounter (Signed)
Left message to call her insurance and let me know the product that is preferred.

## 2015-06-30 ENCOUNTER — Ambulatory Visit (INDEPENDENT_AMBULATORY_CARE_PROVIDER_SITE_OTHER): Payer: BC Managed Care – PPO | Admitting: Internal Medicine

## 2015-06-30 ENCOUNTER — Encounter: Payer: Self-pay | Admitting: Internal Medicine

## 2015-06-30 VITALS — BP 112/60 | HR 77 | Temp 98.1°F | Resp 12 | Ht 66.5 in | Wt 134.6 lb

## 2015-06-30 DIAGNOSIS — E213 Hyperparathyroidism, unspecified: Secondary | ICD-10-CM | POA: Diagnosis not present

## 2015-06-30 MED ORDER — FUROSEMIDE 20 MG PO TABS: 10.0000 mg | ORAL_TABLET | Freq: Every day | ORAL | Status: DC

## 2015-06-30 NOTE — Progress Notes (Addendum)
Patient ID: Rita Berry,Rita Berry DOB: 01-27-1956, 59 y.o.   MRN: [id number]   HPI  [name] is a 59 y.o.-year-old female, referred by her PCP, JEFFERY,CHELLE, PA-C , for evaluation for normocalcemic hyperparathyroidism. She saw Dr Roanna Raider before.  She saw Dr Roanna Raider for stress fx's in the past (~2001): PTH checked >> variable.  Pt was found to have a high parathyroid hormone last month. I reviewed pt's pertinent labs: Lab Results  Component Value Date   PTH 82* 06/16/2015   PTH CANCELED 06/12/2015   CALCIUM 9.4 06/12/2015   CALCIUM 9.6 02/12/2015   I reviewed pt's DEXA scans: Date L1-L4 T score FN T score  06/18/2013   -1.5  RFN: -0.8  LFN: -1.3   + several stress fractures (1990-s - from running - tibia, fibula, some more little ones) - not in last 8-9 year or falls.   She took Fosamax for 8-9 years in the 1990s, then Actonel for 5-6 years, then stopped.  No h/o kidney stones.  No h/o CKD. Last BUN/Cr: Lab Results  Component Value Date   BUN 13 06/12/2015   CREATININE 0.68 06/12/2015   Pt is on HCTZ, but is on chlorthalidone 6.25 mg bid - started 01/2015!   No h/o vitamin D deficiency. Last vit D level was 40 on 06/12/2015  Pt is on calcium (supplements: 1800 mg) and vitamin D (diet + supplements: 1500 units); she also eats dairy and green, leafy, vegetables.  Pt does not have a FH of hypercalcemia, pituitary tumors, thyroid cancer, + h/o osteoporosis in aunt. H/o kidney stones in mother and GM.  I reviewed her chart and she also has a history of early menopause - hysterectomy with BSO (sequential).   She had rickets as a child.   ROS: Constitutional: no weight gain/loss, no fatigue, no subjective hyperthermia/hypothermia Eyes: no blurry vision, no xerophthalmia ENT: no sore throat, no nodules palpated in throat, no dysphagia/odynophagia, no hoarseness Cardiovascular: no CP/SOB/palpitations/leg swelling Respiratory: no cough/SOB Gastrointestinal: no  N/V/D/C Musculoskeletal: no muscle/joint aches Skin: no rashes Neurological: no tremors/numbness/tingling/dizziness Psychiatric: no depression/anxiety  Past Medical History  Diagnosis Date  . CIN I (cervical intraepithelial neoplasia I)   . Ovarian cyst    Past Surgical History  Procedure Laterality Date  . Gynecologic cryosurgery    . Colposcopy    . Abdominal hysterectomy    . Abdominal surgery      Ovarian cyst  . Oophorectomy      BSO  . Tubal ligation     History   Social History  . Marital Status: Married    Spouse Name: Maricela Curet  . Number of Children: 0  . Years of Education: EdD   Occupational History  . Wellness Art gallery manager at [employer]   Social History Main Topics  . Smoking status: Former Games developer, quit in 1991   . Smokeless tobacco: Never Used  . Alcohol Use: 2.4 oz/week    4 Standard drinks or equivalent per week  . Drug Use: No  . Sexual Activity:    Partners: Male    Birth Control/ Protection: Surgical   Social History Narrative   Completed Doctor of Education in Hotel manager from [employer] 04/2013.   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   Lives with her husband.   Current Outpatient Prescriptions on File Prior to Visit  Medication Sig Dispense Refill  . Calcium-Magnesium-Vitamin D (CALCIUM MAGNESIUM PO) Take by mouth.      . chlorthalidone (HYGROTEN) 15 MG tablet Take 1 tablet (15 mg total) by mouth daily. 30 tablet 3  . Cholecalciferol (VITAMIN D PO) Take 2,000 Units by mouth.      . conjugated estrogens (PREMARIN) vaginal cream Place 1 Applicatorful vaginally daily. 42.5 g 12  . Multiple Vitamin (MULTIVITAMIN) tablet Take 1 tablet by mouth daily.      . nitroGLYCERIN (NITRODUR - DOSED IN MG/24 HR) 0.2 mg/hr patch APPLY 1/2 PATCH ONTO SKIN EVERY 24 HOURS 30 patch 0   No current facility-administered  medications on file prior to visit.   Allergies  Allergen Reactions  . Codeine Nausea And Vomiting   Family History  Problem Relation Age of Onset  . Hypertension Mother   . Dementia Mother   . Bradycardia Mother   . Hypertension Father   . Hyperlipidemia Father   . Diabetes Sister     gestational  . Diabetes Maternal Grandmother   . Stroke Maternal Grandmother   . Stroke Maternal Grandfather    PE: BP 112/60 mmHg  Pulse 77  Temp(Src) 98.1 F (36.7 C) (Oral)  Resp 12  Ht 5' 6.5" (1.689 m)  Wt 134 lb 9.6 oz (61.054 kg)  BMI 21.40 kg/m2  SpO2 97% Wt Readings from Last 3 Encounters:  06/30/15 134 lb 9.6 oz (61.054 kg)  06/12/15 134 lb 6.4 oz (60.963 kg)  02/12/15 133 lb (60.328 kg)   Constitutional: Normal weight, in NAD. No kyphosis. Eyes: PERRLA, EOMI, no exophthalmos ENT: moist mucous membranes, no thyromegaly, no cervical lymphadenopathy Cardiovascular: RRR, No MRG Respiratory: CTA B Gastrointestinal: abdomen soft, NT, ND, BS+ Musculoskeletal: no deformities, strength intact in all 4 Skin: moist, warm, no rashes Neurological: no tremor with outstretched hands, DTR normal in all 4  Assessment: 1. Normocalcemic hyperparathyroidism  Plan: Patient has had normal calcium levels, with an intact PTH level high, at 82 ([facility]). I do not have previous labs obtained by Dr. Roanna Raideroerr, will try to obtain those. She does not have a history of vitamin D deficiency, recent vitamin D was normal, at 40.  No apparent complications that could be related to hypercalcemia or hyperparathyroidism except stress fractures, however which were traumatic, due to running: no h/o nephrolithiasis, no osteoporosis, no fractures. No abdominal pain, depression, bone pain. - I discussed with the patient about the physiology of calcium and parathyroid hormone, and possible side effects from increased PTH, including kidney stones, osteoporosis, abdominal pain, etc.  - Patient is on chlorthalidone, which  can cause calcium retention and cause abnormal parathyroid or calcium levels. We will need to stop this and replace it with Lasix and recheck a parathyroid profile in a month after coming off chlorthalidone. At that time, I will check: calcium level intact PTH ([facility] - as this is a more accurate assay c/w [facility] assay) Magnesium Phosphorus 1,25 di HO vitamin D If the labs above are abnormal, we'll need to check a 24h urinary calcium/creatinine ratio - pt given instructions for urine  collection and the jug in case we need to do this - We discussed that we need to check first if she really has hyperparathyroidism, and if she does, whether her hyperparathyroidism is primary (Familial hypercalcemic hypocalciuria or parathyroid adenoma) or secondary (to conditions like: vitamin D deficiency, calcium malabsorption, hypercalciuria, renal insufficiency, etc.). - criteria for parathyroid surgery are:  Increased calcium by more than 1 mg/dL above the  upper limit of normal  Kidney ds.  Osteoporosis (or Vb fx) Age <13 years old New (2013): High UCa >400 mg/d and increased stone risk by biochemical stone risk analysis Presence of nephrolithiasis or nephrocalcinosis Pt's preference!  - We may need to recheck a DEXA scan in the near future, adding a 33% distal radius for evaluation of cortical bone.  - If the tests indicate a parathyroid adenoma, she agrees to have a parathyroid surgery. We discussed possible consequences of hyperparathyroidism: ~1/3 pts will develop complications over 15 years (OP, nephrolithiasis).  - I will see the patient back in 6 months  Orders Placed This Encounter  Procedures  . PTH, intact (no Ca)  . BASIC METABOLIC PANEL WITH GFR  . Vitamin D 1,25 dihydroxy  . Magnesium  . Phosphorus   - time spent with the patient: 1 hour, of which >50% was spent in obtaining information about her symptoms, reviewing her previous labs, evaluations, and treatments, counseling her about  her condition (please see the discussed topics above), and developing a plan to further investigate it; she had a number of questions which I addressed.  Component     Latest Ref Rng 07/31/2015  Sodium     135 - 146 mmol/L 140  Potassium     3.5 - 5.3 mmol/L 4.3  Chloride     98 - 110 mmol/L 104  CO2     20 - 31 mmol/L 28  Glucose     65 - 99 mg/dL 89  BUN     7 - 25 mg/dL 16  Creatinine     1.61 - 0.99 mg/dL 0.96  Calcium     8.6 - 10.4 mg/dL 9.1  GFR, Est African American     >=60 mL/min >89  GFR, Est Non African American     >=60 mL/min >89  Vitamin D 1, 25 (OH) Total     18 - 72 pg/mL 49  Vitamin D3 1, 25 (OH)      49  Vitamin D2 1, 25 (OH)      <8  PTH     15 - 65 pg/mL 43  Magnesium     1.5 - 2.5 mg/dL 2.2  Phosphorus     2.3 - 4.6 mg/dL 3.4   Message sent: Dear Ms Marianne Sofia news: The labs have all normalized after you stopped chlorthalidone.  Please continue to stay off this medicine. We will recheck your calcium and PTH when you come back, but no intervention needed for now. Sincerely, Carlus Pavlov MD

## 2015-06-30 NOTE — Patient Instructions (Addendum)
Please stop Chlorthalidone and start Lasix 10 mg daily in am.  Come back for labs in 1 month.  Please come back for a follow-up appointment in 6 months.  If we need a 24h urine,  Patient information (Up-to-Date): Collection of a 24-hour urine specimen  - You should collect every drop of urine during each 24-hour period. It does not matter how much or little urine is passed each time, as long as every drop is collected. - Begin the urine collection in the morning after you wake up, after you have emptied your bladder for the first time. - Urinate (empty the bladder) for the first time and flush it down the toilet. Note the exact time (eg, 6:15 AM). You will begin the urine collection at this time. - Collect every drop of urine during the day and night in an empty collection bottle. Store the bottle at room temperature or in the refrigerator. - If you need to have a bowel movement, any urine passed with the bowel movement should be collected. Try not to include feces with the urine collection. If feces does get mixed in, do not try to remove the feces from the urine collection bottle. - Finish by collecting the first urine passed the next morning, adding it to the collection bottle. This should be within ten minutes before or after the time of the first morning void on the first day (which was flushed). In this example, you would try to void between 6:05 and 6:25 on the second day. - If you need to urinate one hour before the final collection time, drink a full glass of water so that you can void again at the appropriate time. If you have to urinate 20 minutes before, try to hold the urine until the proper time. - Please note the exact time of the final collection, even if it is not the same time as when collection began on day 1. - The bottle(s) may be kept at room temperature for a day or two, but should be kept cool or refrigerated for longer periods of time.

## 2015-07-01 ENCOUNTER — Ambulatory Visit: Payer: BC Managed Care – PPO | Admitting: Sports Medicine

## 2015-07-31 ENCOUNTER — Other Ambulatory Visit: Payer: BC Managed Care – PPO

## 2015-07-31 ENCOUNTER — Other Ambulatory Visit (INDEPENDENT_AMBULATORY_CARE_PROVIDER_SITE_OTHER): Payer: BC Managed Care – PPO

## 2015-07-31 DIAGNOSIS — E213 Hyperparathyroidism, unspecified: Secondary | ICD-10-CM | POA: Diagnosis not present

## 2015-07-31 LAB — BASIC METABOLIC PANEL WITH GFR
BUN: 16 mg/dL (ref 7–25)
CALCIUM: 9.1 mg/dL (ref 8.6–10.4)
CO2: 28 mmol/L (ref 20–31)
CREATININE: 0.66 mg/dL (ref 0.50–0.99)
Chloride: 104 mmol/L (ref 98–110)
GFR, Est Non African American: >89 mL/min (ref >=60)
Glucose, Bld: 89 mg/dL (ref 65–99)
Potassium: 4.3 mmol/L (ref 3.5–5.3)
SODIUM: 140 mmol/L (ref 135–146)

## 2015-07-31 LAB — PHOSPHORUS: Phosphorus: 3.4 mg/dL (ref 2.3–4.6)

## 2015-07-31 LAB — MAGNESIUM: MAGNESIUM: 2.2 mg/dL (ref 1.5–2.5)

## 2015-08-01 LAB — PARATHYROID HORMONE, INTACT (NO CA): PTH: 43 pg/mL (ref 15–65)

## 2015-08-04 LAB — VITAMIN D 1,25 DIHYDROXY
Vitamin D 1, 25 (OH)2 Total: 49 pg/mL (ref 18–72)
Vitamin D3 1, 25 (OH)2: 49 pg/mL

## 2015-08-10 ENCOUNTER — Ambulatory Visit (INDEPENDENT_AMBULATORY_CARE_PROVIDER_SITE_OTHER): Payer: BC Managed Care – PPO | Admitting: Sports Medicine

## 2015-08-10 ENCOUNTER — Encounter: Payer: Self-pay | Admitting: Sports Medicine

## 2015-08-10 VITALS — BP 149/68 | Ht 67.0 in | Wt 128.0 lb

## 2015-08-10 DIAGNOSIS — M25551 Pain in right hip: Secondary | ICD-10-CM

## 2015-08-10 NOTE — Progress Notes (Signed)
Patient ID: Rita Berry, female   DOB: May 25, 1956, 59 y.o.   MRN: [id number]  Ms. Rita Berry is a 59 year old female with a history of R trochanter bursitis and osteopenia presenting with a few month history of anterior groin pain and posterior gluteal pain.  She was last seen in 11/2014 at which time she was given nitroglycerin and started PT for her R trochanteric bursitis, of which she notes has improved.  Her R anterior hip pain worsens with daily activity, unable to run as she used to.  The pain is burning in quality and sometimes radiates down her anterior thigh.  She notes an associated pain in the right central buttock, being worse with prolonged sitting.  Had one episode of R-sided radicular pain in 11/2014. Denies any bowel/bladder incontinence and no interim occurences.  Pain improves with Advil, but patient is hesitant to take based on history of high blood pressure.  Denies weakness in LE.  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  No history of trauma.   Filed Vitals:   08/10/15 1534  BP: 149/68    Physical exam:   General: Female in 60s sitting comfortably on exam table.  Lower extremities: Internal rotation of R hip decreased by ~50%. 5/5 strength with hip add-/abduction, hip strength flexion testing limited by pain, patellar flex/extension, and plantar-/dorsiflexion. 2+ L patellar reflex, 1+ on right; trace Achilles reflexes bilaterally. Sensation to light touch intact bilaterally. SLR negative bilaterally.  Assessment:  Ms. Rita Berry is a 59 year old female with a history of R trochanter bursitis and osteopenia presenting with a few month history of anterior groin pain and posterior gluteal pain, found to have internal rotation of R hip decreased by ~50% and hip strength flexion testing limited by pain.  Clinical presentation is most consistent with osteoarthritis of the R hip due to age, anterior groin pain, and radiation down anterior thigh that worsens with activity;  due to her BP concerns,she has likely had a sub-optimal NSAID trial.  We suspect that Her R posterior gluteal pain is likely referred pain from her OA; however, cannot completely rule out a separate diagnosis (i.e., piriformis syndrome, lumbar radiculopathy in the setting of subjective sciatic as noted in HPI).  Less concerned for other causes of anterior hip pain such as fracture as patient has no history of trauma/leg-length discrepancy (although has history of osteopenia).    Plan: - Will proceed with plain films: AP pelvis and Lateral R hip. - Pending degree of suspected OA on films, will consider injection as diagnostic/therapeutic. Would also be approach in the setting of limited use of NSAIDs for BP concerns. - Follow-up once imaging results, will re-evaluate therapeutic plan at that time.  Fontaine No, MS  Patient seen and evaluated with the above medical student. I agree with the plan of care. History and physical exam are consistent with probable right hip osteoarthritis. I will get some x-rays to evaluate further. Phone follow-up after that x-ray to discuss treatment to include possible intra-articular cortisone injection.

## 2015-08-11 ENCOUNTER — Ambulatory Visit
Admission: RE | Admit: 2015-08-11 | Discharge: 2015-08-11 | Disposition: A | Discharge status: Home / Self Care | Payer: BC Managed Care – PPO | Source: Ambulatory Visit | Attending: Sports Medicine | Admitting: Sports Medicine

## 2015-08-11 DIAGNOSIS — M25551 Pain in right hip: Secondary | ICD-10-CM

## 2015-08-13 ENCOUNTER — Telehealth: Payer: Self-pay | Admitting: Sports Medicine

## 2015-08-13 NOTE — Telephone Encounter (Signed)
I spoke with the patient on the phone today regarding x-ray findings of her right hip. She has moderate to severe osteoarthritis. She would like to proceed with an intra-articular hip injection for both diagnostic and therapeutic reasons. I will set this up with Dr.Lampke at [facility]. She understands that definitive treatment is a total hip arthroplasty but she is not yet ready to consider that.

## 2015-08-14 ENCOUNTER — Other Ambulatory Visit: Payer: Self-pay | Admitting: *Deleted

## 2015-08-14 DIAGNOSIS — M7061 Trochanteric bursitis, right hip: Secondary | ICD-10-CM

## 2015-08-14 DIAGNOSIS — M7062 Trochanteric bursitis, left hip: Principal | ICD-10-CM

## 2015-08-18 ENCOUNTER — Other Ambulatory Visit: Payer: Self-pay | Admitting: Sports Medicine

## 2015-08-18 DIAGNOSIS — M7061 Trochanteric bursitis, right hip: Secondary | ICD-10-CM

## 2015-08-18 DIAGNOSIS — M7062 Trochanteric bursitis, left hip: Principal | ICD-10-CM

## 2015-09-03 ENCOUNTER — Ambulatory Visit
Admission: RE | Admit: 2015-09-03 | Discharge: 2015-09-03 | Disposition: A | Discharge status: Home / Self Care | Payer: PRIVATE HEALTH INSURANCE | Source: Ambulatory Visit | Attending: Sports Medicine | Admitting: Sports Medicine

## 2015-09-03 DIAGNOSIS — M7061 Trochanteric bursitis, right hip: Secondary | ICD-10-CM

## 2015-09-03 DIAGNOSIS — M7062 Trochanteric bursitis, left hip: Principal | ICD-10-CM

## 2015-09-03 MED ORDER — METHYLPREDNISOLONE ACETATE 40 MG/ML INJ SUSP (RADIOLOG
120.0000 mg | Freq: Once | INTRAMUSCULAR | Status: AC
  Administered 2015-09-03: 120 mg via INTRA_ARTICULAR

## 2015-09-03 MED ORDER — IOHEXOL 180 MG/ML  SOLN
1.0000 mL | Freq: Once | INTRAMUSCULAR | Status: DC | PRN
  Administered 2015-09-03: 1 mL via INTRA_ARTICULAR

## 2015-09-08 ENCOUNTER — Telehealth: Payer: Self-pay | Admitting: *Deleted

## 2015-09-08 NOTE — Telephone Encounter (Signed)
Faxed refill request for Baclofen 2%, Diclofenac 5%, Gabapentin 6%, Ketamine 10%, Tetracaine 3%.  Dr. Charlsie Merles states pt needs to be established with another doctor before refills.  Faxed denial.

## 2015-09-21 ENCOUNTER — Telehealth: Payer: Self-pay | Admitting: Family Medicine

## 2015-09-21 NOTE — Telephone Encounter (Signed)
Spoke with patient about mammogram and she is going to call and set up the mammogram and dexa scan with [facility].  chelle had put the order in when the patient was here for her last visit.

## 2015-10-16 NOTE — Telephone Encounter (Signed)
Was referral placed? Unable to see but looks like Chelle had ordered.

## 2015-10-16 NOTE — Telephone Encounter (Signed)
Pt called stating that she needs orders sent for Dexa scan and Mammogram  [facility]) [facility] Address: [address], [place], [place] [address] Phone: [phone]  Please contact when complete [phone]

## 2015-10-27 ENCOUNTER — Other Ambulatory Visit: Payer: Self-pay | Admitting: *Deleted

## 2015-10-27 DIAGNOSIS — M7062 Trochanteric bursitis, left hip: Principal | ICD-10-CM

## 2015-10-27 DIAGNOSIS — M7061 Trochanteric bursitis, right hip: Secondary | ICD-10-CM

## 2015-10-28 ENCOUNTER — Other Ambulatory Visit: Payer: Self-pay | Admitting: Sports Medicine

## 2015-10-28 DIAGNOSIS — M25551 Pain in right hip: Secondary | ICD-10-CM

## 2015-11-06 ENCOUNTER — Ambulatory Visit
Admission: RE | Admit: 2015-11-06 | Discharge: 2015-11-06 | Disposition: A | Discharge status: Home / Self Care | Payer: PRIVATE HEALTH INSURANCE | Source: Ambulatory Visit | Attending: Sports Medicine | Admitting: Sports Medicine

## 2015-11-06 DIAGNOSIS — M25551 Pain in right hip: Secondary | ICD-10-CM

## 2015-11-06 MED ORDER — IOHEXOL 180 MG/ML  SOLN
1.0000 mL | Freq: Once | INTRAMUSCULAR | Status: DC | PRN
  Administered 2015-11-06: 1 mL via INTRA_ARTICULAR

## 2015-11-06 MED ORDER — METHYLPREDNISOLONE ACETATE 40 MG/ML INJ SUSP (RADIOLOG
120.0000 mg | Freq: Once | INTRAMUSCULAR | Status: AC
  Administered 2015-11-06: 120 mg via INTRA_ARTICULAR

## 2015-11-10 LAB — HM MAMMOGRAPHY

## 2015-11-10 LAB — HM DEXA SCAN

## 2015-11-20 ENCOUNTER — Encounter: Payer: Self-pay | Admitting: Physician Assistant

## 2015-12-07 ENCOUNTER — Telehealth: Payer: Self-pay | Admitting: Internal Medicine

## 2015-12-07 NOTE — Telephone Encounter (Signed)
Patient has question about her high b/p medication, about changing the dosage  she doesn't  know the name of her medication, please advise

## 2015-12-08 ENCOUNTER — Telehealth: Payer: Self-pay

## 2015-12-08 ENCOUNTER — Telehealth: Payer: Self-pay | Admitting: Internal Medicine

## 2015-12-08 NOTE — Telephone Encounter (Signed)
Lvm for pt to return call.

## 2015-12-08 NOTE — Telephone Encounter (Signed)
Rita Berry   Patient is taking BP medication, seen the endrocolist.  BP was good  135/85  Added 5 mg and it was 117/85  Requesting 15 mg a day.    [phone]

## 2015-12-08 NOTE — Telephone Encounter (Signed)
Patient called stating she is returning Shannon's phone call    Please advise    Thank you

## 2015-12-09 NOTE — Telephone Encounter (Signed)
Spoke with pt. Noted in a previous phone message.

## 2015-12-09 NOTE — Telephone Encounter (Signed)
Spoke with pt and she asked about increasing her b/p med. Advised pt that since her PCP is the one who she sees for her b/p issues, that she needs to contact her.

## 2015-12-09 NOTE — Telephone Encounter (Signed)
Which BP medication?  Her list includes Lasix (furosemide) 20 mg and an Imdur patch. I think she must mean something else, but I don't have that on her list.

## 2015-12-10 ENCOUNTER — Encounter: Payer: Self-pay | Admitting: Physician Assistant

## 2015-12-10 NOTE — Telephone Encounter (Signed)
Left message for pt to call back.   I need to know what medication she is referring to. (Name)

## 2015-12-14 NOTE — Telephone Encounter (Signed)
Pt left a message on VM. She states Dr. Elvera LennoxGherghe switched her BP medication to Furosemide 10 mg (she takes half of 20 mg) but it is not keeping her blood pressure down. She wants to know if she can take another quarter of a pill to make it 15 mg to see if her blood pressure improves. Should she contact Dr. Elvera LennoxGherghe?

## 2015-12-14 NOTE — Telephone Encounter (Signed)
I think she and I resolved this by My Chart last week.  Furosemide doesn't come in 10 mg, so she can take 3/4 tablet of the 20 mg or just try taking the whole 20 mg.

## 2015-12-15 NOTE — Telephone Encounter (Signed)
Ok - yes

## 2015-12-17 MED ORDER — FUROSEMIDE 20 MG PO TABS
20.0000 mg | ORAL_TABLET | Freq: Every day | ORAL | Status: DC
Start: 2015-12-17 — End: 2016-06-14

## 2015-12-17 NOTE — Addendum Note (Signed)
Addended by: Fernande BrasJEFFERY, Babak Lucus S on: 12/17/2015 02:50 PM   Modules accepted: Orders

## 2015-12-17 NOTE — Telephone Encounter (Signed)
Patient notified via My Chart.  Meds ordered this encounter  Medications  . furosemide (LASIX) 20 MG tablet    Sig: Take 1 tablet (20 mg total) by mouth daily.    Dispense:  90 tablet    Refill:  3    Order Specific Question:  Supervising Provider    Answer:  DOOLITTLE, ROBERT P [[id number]]

## 2015-12-31 ENCOUNTER — Ambulatory Visit: Payer: BC Managed Care – PPO | Admitting: Internal Medicine

## 2016-01-07 ENCOUNTER — Other Ambulatory Visit: Payer: Self-pay | Admitting: *Deleted

## 2016-01-07 ENCOUNTER — Other Ambulatory Visit: Payer: Self-pay | Admitting: Sports Medicine

## 2016-01-07 DIAGNOSIS — G8929 Other chronic pain: Secondary | ICD-10-CM

## 2016-01-07 DIAGNOSIS — M7061 Trochanteric bursitis, right hip: Secondary | ICD-10-CM

## 2016-01-07 DIAGNOSIS — M25551 Pain in right hip: Principal | ICD-10-CM

## 2016-01-07 DIAGNOSIS — M7062 Trochanteric bursitis, left hip: Principal | ICD-10-CM

## 2016-01-12 ENCOUNTER — Ambulatory Visit: Payer: BC Managed Care – PPO | Admitting: Physician Assistant

## 2016-01-29 ENCOUNTER — Ambulatory Visit
Admission: RE | Admit: 2016-01-29 | Discharge: 2016-01-29 | Disposition: A | Discharge status: Home / Self Care | Payer: PRIVATE HEALTH INSURANCE | Source: Ambulatory Visit | Attending: Sports Medicine | Admitting: Sports Medicine

## 2016-01-29 DIAGNOSIS — G8929 Other chronic pain: Secondary | ICD-10-CM

## 2016-01-29 DIAGNOSIS — M25551 Pain in right hip: Principal | ICD-10-CM

## 2016-01-29 MED ORDER — METHYLPREDNISOLONE ACETATE 40 MG/ML INJ SUSP (RADIOLOG: 120.0000 mg | Freq: Once | INTRAMUSCULAR | Status: DC

## 2016-01-29 MED ORDER — IOHEXOL 180 MG/ML  SOLN: 1.0000 mL | Freq: Once | INTRAMUSCULAR | Status: DC | PRN

## 2016-02-02 ENCOUNTER — Encounter: Payer: Self-pay | Admitting: Physician Assistant

## 2016-02-02 ENCOUNTER — Ambulatory Visit (INDEPENDENT_AMBULATORY_CARE_PROVIDER_SITE_OTHER): Payer: PRIVATE HEALTH INSURANCE | Admitting: Physician Assistant

## 2016-02-02 VITALS — BP 130/80 | HR 77 | Temp 98.4°F | Resp 16 | Ht 66.25 in | Wt 134.2 lb

## 2016-02-02 DIAGNOSIS — H6091 Unspecified otitis externa, right ear: Secondary | ICD-10-CM

## 2016-02-02 DIAGNOSIS — H6122 Impacted cerumen, left ear: Secondary | ICD-10-CM

## 2016-02-02 DIAGNOSIS — H6121 Impacted cerumen, right ear: Secondary | ICD-10-CM | POA: Diagnosis not present

## 2016-02-02 MED ORDER — NEOMYCIN-POLYMYXIN-HC 3.5-10000-1 OT SOLN: 4.0000 [drp] | Freq: Four times a day (QID) | OTIC | Status: AC
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
## 2016-02-02 NOTE — Patient Instructions (Signed)
Please do not insert anything into your ear that is smaller than your elbow.  This includes QTips, keys, hair pins, etc.   If your ears produce extra wax, you can help reduce the build up by:  1) allow the soapy water to drip down into your ear canals when you wash your hair (it can dissolve the wax the same way that dishwashing liquid dissolves grease), and/or  2) mix Hydrogen Peroxide half-and-half with water (DO NOT USE HYDROGEN PEROXIDE WITHOUT DILUTING IT WITH WATER as it can burn the skin in your ear); pour a little of this mixture into each ear canal while in the shower 2-3 times each week.  If your ears are itchy, place several drops of Sweet Oil into each canal to soothe the itching.  If it's not effective, place a small amount of hydrocortisone ointment on the tip of your pinky finger and rub it gently in the ear canal.  The heat of your body will melt the ointment, allowing it to spread in your ear canal and reduce the itching.  

## 2016-02-08 ENCOUNTER — Encounter: Payer: Self-pay | Admitting: Family Medicine

## 2016-02-25 ENCOUNTER — Ambulatory Visit: Payer: PRIVATE HEALTH INSURANCE | Admitting: Internal Medicine

## 2016-02-27 ENCOUNTER — Encounter: Payer: Self-pay | Admitting: Physician Assistant

## 2016-03-08 MED ORDER — CHLORTHALIDONE 25 MG PO TABS: 12.5000 mg | ORAL_TABLET | Freq: Every day | ORAL | Status: DC

## 2016-03-08 NOTE — Telephone Encounter (Signed)
Done

## 2016-03-18 ENCOUNTER — Other Ambulatory Visit: Payer: Self-pay | Admitting: Sports Medicine

## 2016-03-18 ENCOUNTER — Other Ambulatory Visit: Payer: Self-pay | Admitting: *Deleted

## 2016-03-18 ENCOUNTER — Other Ambulatory Visit: Payer: Self-pay | Admitting: Neurology

## 2016-03-18 DIAGNOSIS — M7061 Trochanteric bursitis, right hip: Secondary | ICD-10-CM

## 2016-03-18 DIAGNOSIS — M7062 Trochanteric bursitis, left hip: Principal | ICD-10-CM

## 2016-03-18 NOTE — Progress Notes (Signed)
FYI: Has appt with Dr.Olin in April

## 2016-03-25 ENCOUNTER — Ambulatory Visit
Admission: RE | Admit: 2016-03-25 | Discharge: 2016-03-25 | Disposition: A | Discharge status: Home / Self Care | Payer: PRIVATE HEALTH INSURANCE | Source: Ambulatory Visit | Attending: Sports Medicine | Admitting: Sports Medicine

## 2016-03-25 DIAGNOSIS — M7062 Trochanteric bursitis, left hip: Principal | ICD-10-CM

## 2016-03-25 DIAGNOSIS — M7061 Trochanteric bursitis, right hip: Secondary | ICD-10-CM

## 2016-03-25 MED ORDER — IOHEXOL 180 MG/ML  SOLN
1.0000 mL | Freq: Once | INTRAMUSCULAR | Status: AC | PRN
  Administered 2016-03-25: 1 mL via INTRA_ARTICULAR

## 2016-03-25 MED ORDER — METHYLPREDNISOLONE ACETATE 40 MG/ML INJ SUSP (RADIOLOG
120.0000 mg | Freq: Once | INTRAMUSCULAR | Status: AC
Start: 2016-03-25 — End: 2016-03-25
  Administered 2016-03-25: 120 mg via INTRA_ARTICULAR

## 2016-06-07 ENCOUNTER — Ambulatory Visit: Payer: PRIVATE HEALTH INSURANCE | Admitting: Internal Medicine

## 2016-06-14 ENCOUNTER — Encounter: Payer: Self-pay | Admitting: Physician Assistant

## 2016-06-14 ENCOUNTER — Ambulatory Visit (INDEPENDENT_AMBULATORY_CARE_PROVIDER_SITE_OTHER): Payer: PRIVATE HEALTH INSURANCE | Admitting: Physician Assistant

## 2016-06-14 VITALS — BP 128/80 | HR 76 | Temp 98.4°F | Resp 18 | Ht 66.25 in | Wt 132.4 lb

## 2016-06-14 DIAGNOSIS — Z1159 Encounter for screening for other viral diseases: Secondary | ICD-10-CM

## 2016-06-14 DIAGNOSIS — Z13220 Encounter for screening for lipoid disorders: Secondary | ICD-10-CM | POA: Diagnosis not present

## 2016-06-14 DIAGNOSIS — Z01818 Encounter for other preprocedural examination: Secondary | ICD-10-CM | POA: Diagnosis not present

## 2016-06-14 DIAGNOSIS — M1611 Unilateral primary osteoarthritis, right hip: Secondary | ICD-10-CM | POA: Diagnosis not present

## 2016-06-14 DIAGNOSIS — R319 Hematuria, unspecified: Secondary | ICD-10-CM | POA: Diagnosis not present

## 2016-06-14 DIAGNOSIS — I10 Essential (primary) hypertension: Secondary | ICD-10-CM

## 2016-06-14 LAB — POC MICROSCOPIC URINALYSIS (UMFC): Mucus: ABSENT

## 2016-06-14 LAB — POCT URINALYSIS DIP (MANUAL ENTRY)
BILIRUBIN UA: NEGATIVE
Glucose, UA: NEGATIVE
Leukocytes, UA: NEGATIVE
Nitrite, UA: NEGATIVE
PH UA: 7
PROTEIN UA: NEGATIVE
SPEC GRAV UA: 1.015
UROBILINOGEN UA: 0.2

## 2016-06-14 LAB — CBC WITH DIFFERENTIAL/PLATELET
BASOS PCT: 1 %
Basophils Absolute: 45 cells/uL (ref 0–200)
EOS ABS: 0 {cells}/uL — AB (ref 15–500)
EOS PCT: 0 %
HCT: 40.5 % (ref 35.0–45.0)
Hemoglobin: 13.8 g/dL (ref 11.7–15.5)
LYMPHS PCT: 34 %
Lymphs Abs: 1530 cells/uL (ref 850–3900)
MCH: 32.5 pg (ref 27.0–33.0)
MCHC: 34.1 g/dL (ref 32.0–36.0)
MCV: 95.3 fL (ref 80.0–100.0)
MONOS PCT: 7 %
MPV: 9.5 fL (ref 7.5–12.5)
Monocytes Absolute: 315 cells/uL (ref 200–950)
Neutro Abs: 2610 cells/uL (ref 1500–7800)
Neutrophils Relative %: 58 %
PLATELETS: 256 10*3/uL (ref 140–400)
RBC: 4.25 MIL/uL (ref 3.80–5.10)
RDW: 12.7 % (ref 11.0–15.0)
WBC: 4.5 10*3/uL (ref 3.8–10.8)

## 2016-06-14 MED ORDER — CHLORTHALIDONE 25 MG PO TABS: 12.5000 mg | ORAL_TABLET | Freq: Every day | ORAL | Status: DC

## 2016-06-14 NOTE — Patient Instructions (Signed)
     IF you received an x-ray today, you will receive an invoice from [facility]. Please contact [facility] at [phone] with questions or concerns regarding your invoice.   IF you received labwork today, you will receive an invoice from [facility]. Please contact [facility] at [phone] with questions or concerns regarding your invoice.   Our billing staff will not be able to assist you with questions regarding bills from these companies.  You will be contacted with the lab results as soon as they are available. The fastest way to get your results is to activate your My Chart account. Instructions are located on the last page of this paperwork. If you have not heard from us regarding the results in 2 weeks, please contact this office.      

## 2016-06-14 NOTE — Progress Notes (Signed)
Subjective:    Patient ID: Rita Berry, female    DOB: Aug 07, 1956, 60 y.o.   MRN: [id number]  Chief Complaint  Patient presents with  . Surgical Clearance    for hip replacement surgery, Pt has a form that needs to be signed.     HPI  Patient presents today for pre-op erative evaluation for right hip replacement surgery on July 12, 2016.  Patient has exhausted other types of treatment for osteoarthritis.  Preoperative medical evaluation questions for a healthy patient Questions 1. Do you usually get chest pain or breathlessness when you climb up two flights of stairs at normal speed? NO 2. Do you have kidney disease? NO 3. Has anyone in your family (blood relatives) had a problem following an anaesthetic? NO 4. Have you ever had a heart attack? NO 5. Have you ever been diagnosed with an irregular heartbeat? NO 6. Have you ever had a stroke?  NO 7. If you have been put to sleep for an operation were there any anaesthetic problems? NO 8. Do you suffer from epilepsy or seizures? NO 9. Do you have any problems with pain, stiffness or arthritis in your neck or jaw? NO  10. Do you have thyroid disease? NO 11. Do you suffer from angina? NO 12. Do you have liver disease? NO 13. Have you ever been diagnosed with heart failure? NO 14. Do you suffer from asthma? NO 15. Do you have diabetes that requires insulin? NO 16. Do you have diabetes that requires tablets only? NO 17. Do you suffer from bronchitis? NO   Review of Systems All others negative except those listed in HPI.  Patient Active Problem List   Diagnosis Date Noted  . Osteoarthritis of right hip 06/14/2016  . Essential hypertension 06/12/2015  . Diverticulosis 06/12/2015  . Pain in joint, ankle and foot 09/16/2014  . Bilateral bunions 09/16/2014  . Iliotibial band syndrome of left side 10/30/2013  . Greater trochanteric bursitis of both hips 10/30/2013  . Abnormality of gait 06/18/2013  . Eucalcemic  Hyperparathyroidism 04/19/2013  . Osteopenia 04/19/2013  . CIN I (cervical intraepithelial neoplasia I)   . Ovarian cyst     Current Outpatient Prescriptions on File Prior to Visit  Medication Sig Dispense Refill  . chlorthalidone (HYGROTON) 25 MG tablet Take 0.5-1 tablets (12.5-25 mg total) by mouth daily. 30 tablet 0  . Calcium-Magnesium-Vitamin D (CALCIUM MAGNESIUM PO) Take by mouth. Reported on 06/14/2016    . Cholecalciferol (VITAMIN D PO) Take 2,000 Units by mouth. Reported on 06/14/2016    . conjugated estrogens (PREMARIN) vaginal cream Place 1 Applicatorful vaginally daily. (Patient not taking: Reported on 06/14/2016) 42.5 g 12  . furosemide (LASIX) 20 MG tablet Take 1 tablet (20 mg total) by mouth daily. (Patient not taking: Reported on 06/14/2016) 90 tablet 3  . Multiple Vitamin (MULTIVITAMIN) tablet Take 1 tablet by mouth daily. Reported on 06/14/2016    . nitroGLYCERIN (NITRODUR - DOSED IN MG/24 HR) 0.2 mg/hr patch APPLY 1/2 PATCH ONTO SKIN EVERY 24 HOURS (Patient not taking: Reported on 02/02/2016) 30 patch 0   No current facility-administered medications on file prior to visit.    Allergies  Allergen Reactions  . Codeine Nausea And Vomiting  . Latex Itching       Objective: BP 128/80 mmHg  Pulse 76  Temp(Src) 98.4 F (36.9 C) (Oral)  Resp 18  Ht 5' 6.25" (1.683 m)  Wt 132 lb 6.4 oz (60.056 kg)  BMI 21.20 kg/m2  SpO2 97%    Physical Exam  Constitutional: She is oriented to person, place, and time. She appears well-developed and well-nourished.  HENT:  Head: Normocephalic and atraumatic.  Eyes: Pupils are equal, round, and reactive to light.  Neck: Normal range of motion. Neck supple. No thyromegaly present.  Cardiovascular: Normal rate, regular rhythm and intact distal pulses.  Exam reveals no gallop and no friction rub.   No murmur heard. Pulmonary/Chest: Effort normal. No respiratory distress. She has no wheezes.  Neurological: She is alert and oriented to  person, place, and time.    <ECGINTERP> Orders placed or performed in visit on 06/14/16  . EKG 12-Lead  Normal sinus rhythm   Results for orders placed or performed in visit on 06/14/16  POCT urinalysis dipstick  Result Value Ref Range   Color, UA yellow yellow   Clarity, UA clear clear   Glucose, UA negative negative   Bilirubin, UA negative negative   Ketones, POC UA trace (5) (A) negative   Spec Grav, UA 1.015    Blood, UA moderate (A) negative   pH, UA 7.0    Protein Ur, POC negative negative   Urobilinogen, UA 0.2    Nitrite, UA Negative Negative   Leukocytes, UA Negative Negative  POCT Microscopic Urinalysis ([facility])  Result Value Ref Range   WBC,UR,HPF,POC None None WBC/hpf   RBC,UR,HPF,POC Moderate (A) None RBC/hpf   Bacteria None None, Too numerous to count   Mucus Absent Absent   Epithelial Cells, UR Per Microscopy Few (A) None, Too numerous to count cells/hpf     Assessment & Plan:  1. Primary osteoarthritis of right hip 2. Pre-op evaluation Evaluation indicates patient cleared for surgery. - EKG 12-Lead - POCT urinalysis dipstick - POCT Microscopic Urinalysis ([facility])  3. Need for hepatitis C screening test Labs drawn, results pending. - Hepatitis C antibody  4. Screening for hyperlipidemia Labs drawn, results pending. - Lipid panel  5. Essential hypertension Stable.  Well controlled using 1/2 tablet .25mg  chlorthalidone daily.  Discontinued use of furosemide, due to unsuccessful management of BP.  Labs drawn, Results pending. - Comprehensive metabolic panel - CBC with Differential/Platelet - chlorthalidone (HYGROTON) 25 MG tablet; Take 0.5-1 tablets (12.5-25 mg total) by mouth daily.  Dispense: 30 tablet; Refill: 0  6. Hematuria Indicated by urine studies.  Likely due to patient fasting and run this afternoon.  Instructed patient to return following surgery for re-evaluation of her labs.  Patient to return to the office for follow up after her  surgery.  Brookelle Pellicane P. Jennelle Pinkstaff, PA-S

## 2016-06-14 NOTE — Progress Notes (Signed)
Patient ID: Cecilie Lowerseresa B Everding, female    DOB: [birth year], 60 y.o.   MRN: [id number]  PCP: Porfirio Oarhelle Nanna Ertle, PA-C  Subjective:   Chief Complaint  Patient presents with  . Surgical Clearance    for hip replacement surgery, Pt has a form that needs to be signed.     HPI Presents for pre-operative evaluation. Dr. Charlann Boxerlin has scheduled RIGHT hip replacement on 07/12/2016.  She has no respiratory or cardiac symptoms. No history of kidney or liver disease. She does have HTN which is well controlled on chlorthalidone (she had been switched to furosemide due to hyperparathyroidism, but her BP wasn't well controlled, so she switched back). Thyroid is normal. No respiratory conditions. She does not have diabetes. No personal history of clotting disorder or cardiac dysrhythmias. No history of adverse effects of anesthesia.    Review of Systems  Constitutional: Negative.   HENT: Negative.   Eyes: Negative.   Respiratory: Negative.   Cardiovascular: Negative.   Gastrointestinal: Negative.   Endocrine: Negative.   Genitourinary: Negative.   Musculoskeletal: Positive for arthralgias.  Skin: Negative.   Allergic/Immunologic: Negative.   Neurological: Negative.   Hematological: Negative.   Psychiatric/Behavioral: Negative.        Patient Active Problem List   Diagnosis Date Noted  . Osteoarthritis of right hip 06/14/2016  . Essential hypertension 06/12/2015  . Diverticulosis 06/12/2015  . Pain in joint, ankle and foot 09/16/2014  . Bilateral bunions 09/16/2014  . Iliotibial band syndrome of left side 10/30/2013  . Greater trochanteric bursitis of both hips 10/30/2013  . Abnormality of gait 06/18/2013  . Eucalcemic Hyperparathyroidism 04/19/2013  . Osteopenia 04/19/2013  . CIN I (cervical intraepithelial neoplasia I)   . Ovarian cyst      Prior to Admission medications   Medication Sig Start Date End Date Taking? Authorizing Provider  chlorthalidone (HYGROTON) 25 MG tablet Take  0.5-1 tablets (12.5-25 mg total) by mouth daily. 03/08/16  Yes Morrell RiddleSarah L Weber, PA-C  Calcium-Magnesium-Vitamin D (CALCIUM MAGNESIUM PO) Take by mouth. Reported on 06/14/2016    Historical Provider, MD  Cholecalciferol (VITAMIN D PO) Take 2,000 Units by mouth. Reported on 06/14/2016    Historical Provider, MD  conjugated estrogens (PREMARIN) vaginal cream Place 1 Applicatorful vaginally daily. Patient not taking: Reported on 06/14/2016 06/18/15   Porfirio Oarhelle Camille Thau, PA-C  furosemide (LASIX) 20 MG tablet Take 1 tablet (20 mg total) by mouth daily. Patient not taking: Reported on 06/14/2016 12/17/15   Porfirio Oarhelle Murrel Bertram, PA-C  Multiple Vitamin (MULTIVITAMIN) tablet Take 1 tablet by mouth daily. Reported on 06/14/2016    Historical Provider, MD  nitroGLYCERIN (NITRODUR - DOSED IN MG/24 HR) 0.2 mg/hr patch APPLY 1/2 PATCH ONTO SKIN EVERY 24 HOURS Patient not taking: Reported on 02/02/2016 05/12/15   Enid BaasKarl Fields, MD     Allergies  Allergen Reactions  . Codeine Nausea And Vomiting  . Latex Itching       Objective:  Physical Exam  Constitutional: She is oriented to person, place, and time. She appears well-developed and well-nourished. No distress.  BP 128/80 mmHg  Pulse 76  Temp(Src) 98.4 F (36.9 C) (Oral)  Resp 18  Ht 5' 6.25" (1.683 m)  Wt 132 lb 6.4 oz (60.056 kg)  BMI 21.20 kg/m2  SpO2 97%   Eyes: Conjunctivae are normal. No scleral icterus.  Neck: No thyromegaly present.  Cardiovascular: Normal rate, regular rhythm, normal heart sounds and intact distal pulses.   Pulmonary/Chest: Effort normal and breath sounds normal.  Lymphadenopathy:  She has no cervical adenopathy.  Neurological: She is alert and oriented to person, place, and time.  Skin: Skin is warm and dry.  Psychiatric: She has a normal mood and affect. Her speech is normal and behavior is normal.    EKG reviewed with Dr. Ival BibleSmtih. NSR.  Results for orders placed or performed in visit on 06/14/16  POCT urinalysis dipstick  Result  Value Ref Range   Color, UA yellow yellow   Clarity, UA clear clear   Glucose, UA negative negative   Bilirubin, UA negative negative   Ketones, POC UA trace (5) (A) negative   Spec Grav, UA 1.015    Blood, UA moderate (A) negative   pH, UA 7.0    Protein Ur, POC negative negative   Urobilinogen, UA 0.2    Nitrite, UA Negative Negative   Leukocytes, UA Negative Negative  POCT Microscopic Urinalysis ([facility])  Result Value Ref Range   WBC,UR,HPF,POC None None WBC/hpf   RBC,UR,HPF,POC Moderate (A) None RBC/hpf   Bacteria None None, Too numerous to count   Mucus Absent Absent   Epithelial Cells, UR Per Microscopy Few (A) None, Too numerous to count cells/hpf       Assessment & Plan:   1. Primary osteoarthritis of right hip 2. Pre-op evaluation Very low risk. Proceed with surgery. Faxed clearance for to Dr. Charlann Boxerlin. - EKG 12-Lead - POCT urinalysis dipstick - POCT Microscopic Urinalysis ([facility])  3. Need for hepatitis C screening test - Hepatitis C antibody  4. Screening for hyperlipidemia - Lipid panel  5. Essential hypertension Stable. Controlled. - Comprehensive metabolic panel - CBC with Differential/Platelet - chlorthalidone (HYGROTON) 25 MG tablet; Take 0.5-1 tablets (12.5-25 mg total) by mouth daily.  Dispense: 30 tablet; Refill: 0  6. Hematuria Return after surgery for repeat urinalysis. If persists, will refer to urology.   Fernande Brashelle S. Quanisha Drewry, PA-C Physician Assistant-Certified [facility]

## 2016-06-15 LAB — LIPID PANEL
CHOLESTEROL: 225 mg/dL — AB (ref 125–200)
HDL: 90 mg/dL (ref >=46)
LDL Cholesterol: 118 mg/dL (ref <130)
Total CHOL/HDL Ratio: 2.5 Ratio (ref <=5.0)
Triglycerides: 87 mg/dL (ref <150)
VLDL: 17 mg/dL (ref <30)

## 2016-06-15 LAB — COMPREHENSIVE METABOLIC PANEL
ALBUMIN: 4.1 g/dL (ref 3.6–5.1)
ALK PHOS: 45 U/L (ref 33–130)
ALT: 11 U/L (ref 6–29)
AST: 19 U/L (ref 10–35)
BILIRUBIN TOTAL: 0.7 mg/dL (ref 0.2–1.2)
BUN: 8 mg/dL (ref 7–25)
CALCIUM: 9 mg/dL (ref 8.6–10.4)
CO2: 26 mmol/L (ref 20–31)
CREATININE: 0.64 mg/dL (ref 0.50–0.99)
Chloride: 98 mmol/L (ref 98–110)
Glucose, Bld: 82 mg/dL (ref 65–99)
Potassium: 3.9 mmol/L (ref 3.5–5.3)
SODIUM: 135 mmol/L (ref 135–146)
TOTAL PROTEIN: 6.6 g/dL (ref 6.1–8.1)

## 2016-06-15 LAB — HEPATITIS C ANTIBODY: HCV AB: NEGATIVE

## 2016-06-22 ENCOUNTER — Encounter: Payer: Self-pay | Admitting: Physician Assistant

## 2016-06-29 ENCOUNTER — Ambulatory Visit (INDEPENDENT_AMBULATORY_CARE_PROVIDER_SITE_OTHER): Payer: PRIVATE HEALTH INSURANCE | Admitting: Sports Medicine

## 2016-06-29 ENCOUNTER — Encounter: Payer: Self-pay | Admitting: Sports Medicine

## 2016-06-29 VITALS — BP 132/74 | HR 75 | Ht 66.25 in | Wt 132.0 lb

## 2016-06-29 DIAGNOSIS — M1611 Unilateral primary osteoarthritis, right hip: Secondary | ICD-10-CM | POA: Diagnosis not present

## 2016-06-29 MED ORDER — BACLOFEN 2 % EX CREA: 2.0000 g | TOPICAL_CREAM | Freq: Two times a day (BID) | CUTANEOUS | Status: DC

## 2016-06-29 MED ORDER — TRAMADOL HCL 50 MG PO TABS: ORAL_TABLET | ORAL | Status: DC

## 2016-06-29 NOTE — Progress Notes (Signed)
   Subjective:    Patient ID: Rita Berry, female    DOB: 31-May-1956, 60 y.o.   MRN: [id number]  HPI Patient comes in today to discuss treatment for her right hip osteoarthritis. She has had several intra-articular cortisone injections (total of 4). The first injection provided her with 3 months of relief but the follow-up injections have been less effective. Her last injection was in April and it helped her for only 7 days. She was scheduled for total hip arthroplasty with Dr.Olin on July 25th but decided to cancel that surgery. She understands that total hip arthroplasty is definitive treatment but she is not yet ready to proceed with this. She has been working with Ellamae SiaJohn O'Halloran in physical therapy and has also started using 1/8 patch of nitroglycerin (larger patches seem to cause headaches). In addition to this she also takes intermittent Tylenol as well as intermittent Excedrin. What has been most helpful is a compounded topical pharmaceutical which is made of baclofen 2%, diclofenac 5%, gabapentin 6%, ketaamine 10%, and tetracaine 3%. She is requesting a refill on this. She is also asking if there is anything else in addition to all of this that she may be able to take or do. She has started some acupuncture which has been helpful. Again, she understands definitive treatment is a total hip arthroplasty but she is just simply not ready to proceed with that yet.   Review of Systems     Objective:   Physical Exam  Well-developed, well-nourished. No acute distress  Right hip: Severely limited passive internal and external rotation with reproducible pain. Pain with resisted hip flexion. Neurovascularly intact distally.      Assessment & Plan:   Right hip pain secondary to advanced DJD  I refilled the patient's topical medication (see above). I've also recommended tramadol to be taken as needed for more severe pain. I think she should continue working with physical therapy and with  acupuncture if she finds it to be beneficial. I do not think any further intra-articular injections are going to help her. When she is ready, she will proceed with total hip arthroplasty. She understands that this is an elective procedure that can be done at any point down the road.

## 2016-07-12 ENCOUNTER — Inpatient Hospital Stay: Admit: 2016-07-12 | Payer: PRIVATE HEALTH INSURANCE | Admitting: Orthopedic Surgery

## 2016-07-12 ENCOUNTER — Other Ambulatory Visit: Payer: Self-pay | Admitting: Physician Assistant

## 2016-07-12 DIAGNOSIS — I10 Essential (primary) hypertension: Secondary | ICD-10-CM

## 2016-07-12 SURGERY — ARTHROPLASTY, HIP, TOTAL, ANTERIOR APPROACH
Anesthesia: Choice | Site: Hip | Laterality: Right

## 2016-07-15 ENCOUNTER — Ambulatory Visit (INDEPENDENT_AMBULATORY_CARE_PROVIDER_SITE_OTHER): Payer: PRIVATE HEALTH INSURANCE | Admitting: Physician Assistant

## 2016-07-15 VITALS — BP 108/62 | HR 79 | Temp 98.1°F | Resp 16 | Ht 66.0 in | Wt 130.6 lb

## 2016-07-15 DIAGNOSIS — M654 Radial styloid tenosynovitis [de Quervain]: Secondary | ICD-10-CM

## 2016-07-15 NOTE — Patient Instructions (Signed)
IF you received an x-ray today, you will receive an invoice from [facility]. Please contact [facility] at [phone] with questions or concerns regarding your invoice.   IF you received labwork today, you will receive an invoice from [employer]. Please contact [facility] at [phone] with questions or concerns regarding your invoice.   Our billing staff will not be able to assist you with questions regarding bills from these companies.  You will be contacted with the lab results as soon as they are available. The fastest way to get your results is to activate your My Chart account. Instructions are located on the last page of this paperwork. If you have not heard from [place] regarding the results in 2 weeks, please contact this office.     De Quervain Tenosynovitis Tendons attach muscles to bones. They also help with joint movements. When tendons become irritated or swollen, it is called tendinitis. The extensor pollicis brevis (EPB) tendon connects the EPB muscle to a bone that is near the base of the thumb. The EPB muscle helps to straighten and extend the thumb. De Quervain tenosynovitis is a condition in which the EPB tendon lining (sheath) becomes irritated, thickened, and swollen. This condition is sometimes called stenosing tenosynovitis. This condition causes pain on the thumb side of the back of the wrist. CAUSES Causes of this condition include:  Activities that repeatedly cause your thumb and wrist to extend.  A sudden increase in activity or change in activity that affects your wrist. RISK FACTORS: This condition is more likely to develop in:  Females.  People who have diabetes.  Women who have recently given birth.  People who are over 62 years of age.  People who do activities that involve repeated hand and wrist motions, such as tennis, racquetball, volleyball, gardening, and taking care of children.  People who do  heavy labor.  People who have poor wrist strength and flexibility.  People who do not warm up properly before activities. SYMPTOMS Symptoms of this condition include:  Pain or tenderness over the thumb side of the back of the wrist when your thumb and wrist are not moving.  Pain that gets worse when you straighten your thumb or extend your thumb or wrist.  Pain when the injured area is touched.  Locking or catching of the thumb joint while you bend and straighten your thumb.  Decreased thumb motion due to pain.  Swelling over the affected area. DIAGNOSIS This condition is diagnosed with a medical history and physical exam. Your health care provider will ask for details about your injury and ask about your symptoms. TREATMENT Treatment may include the use of icing and medicines to reduce pain and swelling. You may also be advised to wear a splint or brace to limit your thumb and wrist motion. In less severe cases, treatment may also include working with a physical therapist to strengthen your wrist and calm the irritation around your EPB tendon sheath. In severe cases, surgery may be needed. HOME CARE INSTRUCTIONS If You Have a Splint or Brace:  Wear it as told by your health care provider. Remove it only as told by your health care provider.  Loosen the splint or brace if your fingers become numb and tingle, or if they turn cold and blue.  Keep the splint or brace clean and dry. Managing Pain, Stiffness, and Swelling   If directed, apply ice to the injured area.  Put ice in a plastic bag.  Place a towel between your skin and the bag.  Leave the ice on for 20 minutes, 2-3 times per day.  Move your fingers often to avoid stiffness and to lessen swelling.  Raise (elevate) the injured area above the level of your heart while you are sitting or lying down. General Instructions  Return to your normal activities as told by your health care provider. Ask your health care  provider what activities are safe for you.  Take over-the-counter and prescription medicines only as told by your health care provider.  Keep all follow-up visits as told by your health care provider. This is important.  Do not drive or operate heavy machinery while taking prescription pain medicine. SEEK MEDICAL CARE IF:  Your pain, tenderness, or swelling gets worse, even if you have had treatment.  You have numbness or tingling in your wrist, hand, or fingers on the injured side.   This information is not intended to replace advice given to you by your health care provider. Make sure you discuss any questions you have with your health care provider.   Document Released: 12/05/2005 Document Revised: 08/26/2015 Document Reviewed: 02/10/2015 Elsevier Interactive Patient Education [employer]

## 2016-07-15 NOTE — Progress Notes (Signed)
Patient ID: Rita Berry, female    DOB: [birth year], 60 y.o.   MRN: [id number]  PCP: Porfirio Oar, PA-C  Subjective:   Chief Complaint  Patient presents with  . Hand Pain    1st digit, left hand    HPI Presents for evaluation of LEFT thumb pain.  This pain began 3-4 years ago, when repeatedly carrying the mail in her LEFT hand, grasping the papers between the thumb and the four fingers, causing pain in the LEFT thumb. Bought a thumb splint, but it doesn't help. She has pain with grasping and turning, like locking/unlocking a door with a key, grasping a jug of milk, etc.  She was going to talk with me about it, but then her hip started bothering her and it became a lower priority concern.  Has elected not to have the THR now and to pursue PT longer. Now she's moved the thumb pain up the priority list.  She is RIGHT hand dominant.    Review of Systems  Musculoskeletal: Positive for arthralgias.  Neurological: Positive for weakness (due to pain). Negative for tremors and numbness.  Hematological: Negative for adenopathy. Does not bruise/bleed easily.       Patient Active Problem List   Diagnosis Date Noted  . Osteoarthritis of right hip 06/14/2016  . Essential hypertension 06/12/2015  . Diverticulosis 06/12/2015  . Pain in joint, ankle and foot 09/16/2014  . Bilateral bunions 09/16/2014  . Iliotibial band syndrome of left side 10/30/2013  . Greater trochanteric bursitis of both hips 10/30/2013  . Abnormality of gait 06/18/2013  . Eucalcemic Hyperparathyroidism 04/19/2013  . Osteopenia 04/19/2013  . CIN I (cervical intraepithelial neoplasia I)   . Ovarian cyst      Prior to Admission medications   Medication Sig Start Date End Date Taking? Authorizing Provider  Baclofen 2 % CREA Apply 2 g topically 2 (two) times daily. This is to be compounded with: Diclofenac 5% Gabapentin 6% Ketamine 10% Tetracaine 3% 06/29/16  Yes Timothy R Draper, DO    Calcium-Magnesium-Vitamin D (CALCIUM MAGNESIUM PO) Take by mouth. Reported on 06/14/2016   Yes Historical Provider, MD  chlorthalidone (HYGROTON) 25 MG tablet TAKE 1/2 TO 1 TABLET(12.5 TO 25 MG) BY MOUTH DAILY 07/14/16  Yes Hetty Linhart, PA-C  Cholecalciferol (VITAMIN D PO) Take 2,000 Units by mouth. Reported on 06/14/2016   Yes Historical Provider, MD  Multiple Vitamin (MULTIVITAMIN) tablet Take 1 tablet by mouth daily. Reported on 06/14/2016   Yes Historical Provider, MD  nitroGLYCERIN (NITRODUR - DOSED IN MG/24 HR) 0.2 mg/hr patch APPLY 1/2 PATCH ONTO SKIN EVERY 24 HOURS 05/12/15  Yes Enid Baas, MD  traMADol Janean Sark) 50 MG tablet Take one tab daily or twice a day as needed Patient not taking: Reported on 07/15/2016 06/29/16   Ralene Cork, DO     Allergies  Allergen Reactions  . Codeine Nausea And Vomiting  . Latex Itching       Objective:  Physical Exam  Constitutional: She is oriented to person, place, and time. She appears well-developed and well-nourished. She is active and cooperative. No distress.  BP 108/62 (BP Location: Right Arm, Patient Position: Sitting, Cuff Size: Normal)   Pulse 79   Temp 98.1 F (36.7 C) (Oral)   Resp 16   Ht 5\' 6"  (1.676 m)   Wt 130 lb 9.6 oz (59.2 kg)   SpO2 97%   BMI 21.08 kg/m    Eyes: Conjunctivae are normal.  Pulmonary/Chest: Effort normal.  Musculoskeletal:  Left hand: She exhibits tenderness and swelling. She exhibits normal range of motion, no bony tenderness, normal two-point discrimination, normal capillary refill, no deformity and no laceration. Normal sensation noted. Decreased strength noted. She exhibits no finger abduction, no thumb/finger opposition and no wrist extension trouble.       Hands: Neurological: She is alert and oriented to person, place, and time.  Psychiatric: She has a normal mood and affect. Her speech is normal and behavior is normal.           Assessment & Plan:   1. De Quervain's  tenosynovitis, left Thumb spica splint. Ice massage. Follow-up with sports medicine if persists.   Fernande Bras, PA-C Physician Assistant-Certified [facility]

## 2016-08-01 ENCOUNTER — Telehealth: Payer: Self-pay | Admitting: *Deleted

## 2016-08-01 ENCOUNTER — Other Ambulatory Visit: Payer: Self-pay | Admitting: *Deleted

## 2016-08-01 DIAGNOSIS — M25551 Pain in right hip: Secondary | ICD-10-CM

## 2016-08-01 DIAGNOSIS — M25552 Pain in left hip: Principal | ICD-10-CM

## 2016-08-01 MED ORDER — BACLOFEN 2 % EX CREA: 2.0000 g | TOPICAL_CREAM | Freq: Two times a day (BID) | CUTANEOUS | 3 refills | Status: DC

## 2016-08-01 NOTE — Telephone Encounter (Signed)
-----   Message from Ralene Corkimothy R Draper, DO sent at 08/01/2016 10:43 AM EDT ----- Regarding: RE: refill with request Contact: [phone] Yes.   ----- Message ----- From: Frankey Poothea N Kema Santaella, RN Sent: 08/01/2016  10:34 AM To: Ralene Corkimothy R Draper, DO Subject: FW: refill with request                        Is this ok to refill and to add the lidocaine?   ----- Message ----- From: Lizbeth BarkMelanie L Ceresi Sent: 08/01/2016   9:17 AM To: Frankey Poothea N Xsavier Seeley, RN Subject: refill with request                            Baclofen 2 % CREA refill and pt is asking to add 3% lidocaine to her script.

## 2016-08-01 NOTE — Telephone Encounter (Signed)
Patient is requesting new cream refill with using lidocaine 3%  instead of the tetracaine 3% [facility]

## 2016-08-05 ENCOUNTER — Encounter: Payer: Self-pay | Admitting: Sports Medicine

## 2016-08-10 ENCOUNTER — Other Ambulatory Visit: Payer: Self-pay | Admitting: Orthopedic Surgery

## 2016-08-10 ENCOUNTER — Ambulatory Visit (INDEPENDENT_AMBULATORY_CARE_PROVIDER_SITE_OTHER): Payer: PRIVATE HEALTH INSURANCE | Admitting: Family Medicine

## 2016-08-10 ENCOUNTER — Ambulatory Visit (INDEPENDENT_AMBULATORY_CARE_PROVIDER_SITE_OTHER): Payer: PRIVATE HEALTH INSURANCE

## 2016-08-10 ENCOUNTER — Encounter: Payer: Self-pay | Admitting: Family Medicine

## 2016-08-10 VITALS — BP 148/78 | HR 85 | Temp 97.8°F | Resp 16 | Ht 66.0 in | Wt 134.0 lb

## 2016-08-10 DIAGNOSIS — S42402A Unspecified fracture of lower end of left humerus, initial encounter for closed fracture: Secondary | ICD-10-CM

## 2016-08-10 DIAGNOSIS — M25512 Pain in left shoulder: Secondary | ICD-10-CM | POA: Diagnosis not present

## 2016-08-10 DIAGNOSIS — M25522 Pain in left elbow: Secondary | ICD-10-CM

## 2016-08-10 NOTE — Patient Instructions (Addendum)
Your shoulder looks good.  You have a fracture of your elbow.  Because of this and the fact you are having numbness in your hand, we are sending you over to Orthopedics.    I'm sorry this happened. It was good to meet you today    IF you received an x-ray today, you will receive an invoice from [facility]. Please contact [facility] at [phone] with questions or concerns regarding your invoice.   IF you received labwork today, you will receive an invoice from [facility]. Please contact [facility] at [phone] with questions or concerns regarding your invoice.   Our billing staff will not be able to assist you with questions regarding bills from these companies.  You will be contacted with the lab results as soon as they are available. The fastest way to get your results is to activate your My Chart account. Instructions are located on the last page of this paperwork. If you have not heard from us regarding the results in 2 weeks, please contact this office.    You have an appointment with [facility] at 1:00 pm but arive at 12:30  [address]

## 2016-08-10 NOTE — Progress Notes (Signed)
Rita Berry is a 60 y.o. female who presents to Urgent Care today for fall this AM  1.  Fall:  Patient out walking her dogs this AM.  Dogs saw a cat while she was turning, and pulled her over on her left side.  Shoulder and elbow bore the brunt of the fall.   Immediate pain in Left elbow.  Able to get up and use her Right arm to get the dogs back to her house.  Called husband and lay on couch with ice.  Pain in Left shoulder is mild, elbow is severe with movement, mild at rest.  Due to persistent pain, she presented to Urgent care today.  Does endorse some tingling in Left thumb.  Otherwise no numbness/tingling/weakness.  Some abrasions Left calf.    ROS as above  PMH reviewed. Patient is a nonsmoker.   Past Medical History:  Diagnosis Date  . Arthritis   . CIN I (cervical intraepithelial neoplasia I)   . Ovarian cyst    Past Surgical History:  Procedure Laterality Date  . ABDOMINAL HYSTERECTOMY    . ABDOMINAL SURGERY     Ovarian cyst  . COLPOSCOPY    . GYNECOLOGIC CRYOSURGERY    . OOPHORECTOMY     BSO  . TUBAL LIGATION      Medications reviewed. Current Outpatient Prescriptions  Medication Sig Dispense Refill  . Calcium-Magnesium-Vitamin D (CALCIUM MAGNESIUM PO) Take by mouth. Reported on 06/14/2016    . chlorthalidone (HYGROTON) 25 MG tablet TAKE 1/2 TO 1 TABLET(12.5 TO 25 MG) BY MOUTH DAILY 90 tablet 1  . Cholecalciferol (VITAMIN D PO) Take 2,000 Units by mouth. Reported on 06/14/2016    . Multiple Vitamin (MULTIVITAMIN) tablet Take 1 tablet by mouth daily. Reported on 06/14/2016    . nitroGLYCERIN (NITRODUR - DOSED IN MG/24 HR) 0.2 mg/hr patch APPLY 1/2 PATCH ONTO SKIN EVERY 24 HOURS 30 patch 0  . Baclofen 2 % CREA Apply 2 g topically 2 (two) times daily. This is to be compounded with: Diclofenac 5% Gabapentin 6% Ketamine 10% lidocaine 3% (Patient not taking: Reported on 08/10/2016) 100 g 3  . traMADol (ULTRAM) 50 MG tablet Take one tab daily or twice a day as needed (Patient  not taking: Reported on 07/15/2016) 60 tablet 1   No current facility-administered medications for this visit.      Physical Exam:  BP (!) 148/78   Pulse 85   Temp 97.8 F (36.6 C) (Oral)   Resp 16   Ht 5\' 6"  (1.676 m)   Wt 134 lb (60.8 kg)   SpO2 97%   BMI 21.63 kg/m  Gen:  Alert, cooperative patient who appears stated age in no acute distress.  Vital signs reviewed. HEENT: EOMI,  MMM CV:  +2 pulses BL wrists Neuro:  Sensation intact Right hand and Left digits 2 - 5.  1st digit/thumb with some mild numbness over dorsal aspect.  Strength in Left hand 4/5, limited by pain in elbow.   MSK:   - Left elbow:  Bruising noted over medial aspect of elbow.  Holding arm in flexion across her chest.  Able to abduct arm to about 90 degrees.  Cannot extend elbow past 90 degrees due to pain.  Some swelling noted medial aspect of Left elbow.  Can flex elbow without pain.  No tenderness over olecranon.   - Left shoulder:  Some mild abrasion noted over Lateral aspect of shoulder.  Pinpoint tenderness over acromion, mild.  Otherwise ROM is  good in shoulder and no other pain noted.  No bruising.  [facility] reading (PRIMARY) by  Dr. Gwendolyn GrantWalden:  No issues with shoulder xray.  Elbow revealed lateral condylar fracture.    Assessment and Plan:  1.  Left elbow pain: - Dsplaced fracture through lateral condyle Left humerus.   - with some numbness present Left thumb.  - sending directly over to Orhtopedics now for further recommendations.  Will need surgical repair  2.  Left shoulder pain:   - s/p fall.  Acromion WNL - ice and analgesia. - no evidence of fracture or injury to shoulder.

## 2016-08-11 ENCOUNTER — Ambulatory Visit
Admission: RE | Admit: 2016-08-11 | Discharge: 2016-08-11 | Disposition: A | Discharge status: Home / Self Care | Payer: PRIVATE HEALTH INSURANCE | Source: Ambulatory Visit | Attending: Orthopedic Surgery | Admitting: Orthopedic Surgery

## 2016-08-11 ENCOUNTER — Other Ambulatory Visit: Payer: Self-pay | Admitting: Orthopedic Surgery

## 2016-08-11 DIAGNOSIS — S42402A Unspecified fracture of lower end of left humerus, initial encounter for closed fracture: Secondary | ICD-10-CM

## 2016-08-12 ENCOUNTER — Encounter (HOSPITAL_BASED_OUTPATIENT_CLINIC_OR_DEPARTMENT_OTHER): Payer: Self-pay | Admitting: *Deleted

## 2016-08-12 ENCOUNTER — Encounter (HOSPITAL_BASED_OUTPATIENT_CLINIC_OR_DEPARTMENT_OTHER)
Admission: RE | Admit: 2016-08-12 | Discharge: 2016-08-12 | Disposition: A | Discharge status: Home / Self Care | Payer: PRIVATE HEALTH INSURANCE | Source: Ambulatory Visit | Attending: Orthopedic Surgery | Admitting: Orthopedic Surgery

## 2016-08-12 DIAGNOSIS — Z01818 Encounter for other preprocedural examination: Secondary | ICD-10-CM | POA: Insufficient documentation

## 2016-08-12 LAB — BASIC METABOLIC PANEL
ANION GAP: 10 (ref 5–15)
BUN: 12 mg/dL (ref 6–20)
CHLORIDE: 101 mmol/L (ref 101–111)
CO2: 26 mmol/L (ref 22–32)
Calcium: 9.6 mg/dL (ref 8.9–10.3)
Creatinine, Ser: 0.65 mg/dL (ref 0.44–1.00)
Glucose, Bld: 87 mg/dL (ref 65–99)
POTASSIUM: 3.5 mmol/L (ref 3.5–5.1)
SODIUM: 137 mmol/L (ref 135–145)

## 2016-08-15 NOTE — H&P (Signed)
Rita Berry is an 60 y.o. female.   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  She was evaluated just a short while ago where x-rays were obtained, revealing a displaced intra-articular fracture of the humeral condyle laterally.  She presents today in a sling.  Past Medical History:  Diagnosis Date  . Arthritis    hip  . CIN I (cervical intraepithelial neoplasia I)   . Complication of anesthesia   . Humerus distal fracture    left  . Hypertension   . Ovarian cyst   . PONV (postoperative nausea and vomiting)     Past Surgical History:  Procedure Laterality Date  . ABDOMINAL HYSTERECTOMY    . ABDOMINAL SURGERY     Ovarian cyst  . COLPOSCOPY    . GYNECOLOGIC CRYOSURGERY    . OOPHORECTOMY     BSO  . TUBAL LIGATION      Family History  Problem Relation Age of Onset  . Hypertension Mother   . Dementia Mother   . Bradycardia Mother   . Hypertension Father   . Hyperlipidemia Father   . Diabetes Sister     gestational  . Diabetes Maternal Grandmother   . Stroke Maternal Grandmother   . Stroke Maternal Grandfather    Social History:  reports that she has quit smoking. She has never used smokeless tobacco. She reports that she drinks about 5.4 oz of alcohol per week . She reports that she does not use drugs.  Allergies:  Allergies  Allergen Reactions  . Codeine Nausea And Vomiting  . Latex Itching    No prescriptions prior to admission.    No results found for this or any previous visit (from the past 48 hour(s)). No results found.  Review of Systems  All other systems reviewed and are negative.   Height 5\' 6"  (1.676 m), weight 60.8 kg (134 lb). Physical Exam  Constitutional:  WD, WN, NAD HEENT:  NCAT, EOMI Neuro/Psych:  Alert & oriented to  person, place, and time; appropriate mood & affect Lymphatic: No generalized UE edema or lymphadenopathy Extremities / MSK:  Both UE are normal with respect to appearance, ranges of motion, joint stability, muscle strength/tone, sensation, & perfusion except as otherwise noted:  Left elbow is comfortably held close to 90.  Flexion beyond this is painful and more difficult, likely blocked by the mechanics of the fracture.  She has some mild tingling into the superficial radial nerve distribution, but intact light touch sensibility in it, as well as the median and ulnar distributions.  Intact motor to the same.  Digits are warm with brisk capillary refill and palpable radial pulse  Labs / X-rays:  No radiographic studies obtained today.  Injury x-rays are reviewed  Assessment: Intra-articular displaced left distal humerus fracture of the lateral condyle, may be comminuted  Plan:  I discussed these findings with her and recommended unequivocally that she undergo open surgical treatment to obtain and maintain a more anatomic alignment of the fracture.  2 assisted surgical planning as it relates to anticipated equipment and surgical approach, as well as to gain better understanding of the 3-dimensional nature of the fracture, she is referred for a CT evaluation to be performed this week with anticipated surgical reconstruction on Tuesday.  She has tramadol on hand, but I provided a prescription for Norco that she may use preoperatively or postoperatively as needed.  We reviewed the radiographs and used a plastic model to help explain the problem and the proposed treatment. The details of the operative procedure were discussed with the patient.  Questions were invited and answered.  In addition to the goal of the procedure, the risks of the procedure to include but not limited to bleeding; infection; damage to the nerves or blood vessels that could result in bleeding, numbness, weakness, chronic pain, and the  need for additional procedures; stiffness; the need for revision surgery; and anesthetic risks were reviewed.  No specific outcome was guaranteed or implied.  Informed consent was obtained.  A long-arm splint was applied as well.  Debra Calabretta A., MD 08/15/2016, 6:43 PM

## 2016-08-16 ENCOUNTER — Ambulatory Visit (HOSPITAL_BASED_OUTPATIENT_CLINIC_OR_DEPARTMENT_OTHER): Payer: PRIVATE HEALTH INSURANCE | Admitting: Anesthesiology

## 2016-08-16 ENCOUNTER — Encounter (HOSPITAL_BASED_OUTPATIENT_CLINIC_OR_DEPARTMENT_OTHER)
Admission: RE | Disposition: A | Discharge status: Home / Self Care | Payer: Self-pay | Source: Ambulatory Visit | Attending: Orthopedic Surgery

## 2016-08-16 ENCOUNTER — Ambulatory Visit (HOSPITAL_BASED_OUTPATIENT_CLINIC_OR_DEPARTMENT_OTHER)
Admission: RE | Admit: 2016-08-16 | Discharge: 2016-08-16 | Disposition: A | Discharge status: Home / Self Care | Payer: PRIVATE HEALTH INSURANCE | Source: Ambulatory Visit | Attending: Orthopedic Surgery | Admitting: Orthopedic Surgery

## 2016-08-16 ENCOUNTER — Encounter (HOSPITAL_BASED_OUTPATIENT_CLINIC_OR_DEPARTMENT_OTHER): Payer: Self-pay | Admitting: *Deleted

## 2016-08-16 ENCOUNTER — Ambulatory Visit (HOSPITAL_COMMUNITY): Payer: PRIVATE HEALTH INSURANCE

## 2016-08-16 DIAGNOSIS — W1830XA Fall on same level, unspecified, initial encounter: Secondary | ICD-10-CM | POA: Insufficient documentation

## 2016-08-16 DIAGNOSIS — I10 Essential (primary) hypertension: Secondary | ICD-10-CM | POA: Diagnosis not present

## 2016-08-16 DIAGNOSIS — Z79899 Other long term (current) drug therapy: Secondary | ICD-10-CM | POA: Diagnosis not present

## 2016-08-16 DIAGNOSIS — Z87891 Personal history of nicotine dependence: Secondary | ICD-10-CM | POA: Insufficient documentation

## 2016-08-16 DIAGNOSIS — Z419 Encounter for procedure for purposes other than remedying health state, unspecified: Secondary | ICD-10-CM

## 2016-08-16 DIAGNOSIS — S42452A Displaced fracture of lateral condyle of left humerus, initial encounter for closed fracture: Secondary | ICD-10-CM | POA: Diagnosis not present

## 2016-08-16 HISTORY — DX: Other specified postprocedural states: R11.2

## 2016-08-16 HISTORY — DX: Adverse effect of unspecified anesthetic, initial encounter: T41.45XA

## 2016-08-16 HISTORY — DX: Unspecified fracture of lower end of unspecified humerus, initial encounter for closed fracture: S42.409A

## 2016-08-16 HISTORY — DX: Other specified postprocedural states: Z98.890

## 2016-08-16 HISTORY — DX: Other complications of anesthesia, initial encounter: T88.59XA

## 2016-08-16 HISTORY — PX: ORIF HUMERUS FRACTURE: SHX2126

## 2016-08-16 SURGERY — OPEN REDUCTION INTERNAL FIXATION (ORIF) DISTAL HUMERUS FRACTURE
Anesthesia: General | Site: Arm Upper | Laterality: Left

## 2016-08-16 MED ORDER — FENTANYL CITRATE (PF) 100 MCG/2ML IJ SOLN
INTRAMUSCULAR | Status: AC
  Filled 2016-08-16: qty 2

## 2016-08-16 MED ORDER — MIDAZOLAM HCL 2 MG/2ML IJ SOLN
1.0000 mg | INTRAMUSCULAR | Status: DC | PRN
  Administered 2016-08-16 (×2): 1 mg via INTRAVENOUS
  Administered 2016-08-16: 2 mg via INTRAVENOUS

## 2016-08-16 MED ORDER — LACTATED RINGERS IV SOLN: INTRAVENOUS | Status: DC

## 2016-08-16 MED ORDER — FENTANYL CITRATE (PF) 100 MCG/2ML IJ SOLN
50.0000 ug | INTRAMUSCULAR | Status: AC | PRN
  Administered 2016-08-16: 50 ug via INTRAVENOUS
  Administered 2016-08-16: 100 ug via INTRAVENOUS
  Administered 2016-08-16: 50 ug via INTRAVENOUS

## 2016-08-16 MED ORDER — MIDAZOLAM HCL 2 MG/2ML IJ SOLN
INTRAMUSCULAR | Status: AC
  Filled 2016-08-16: qty 2

## 2016-08-16 MED ORDER — CEFAZOLIN SODIUM-DEXTROSE 2-4 GM/100ML-% IV SOLN
2.0000 g | INTRAVENOUS | Status: AC
  Administered 2016-08-16: 2 g via INTRAVENOUS

## 2016-08-16 MED ORDER — DEXAMETHASONE SODIUM PHOSPHATE 10 MG/ML IJ SOLN
INTRAMUSCULAR | Status: DC | PRN
  Administered 2016-08-16: 10 mg via INTRAVENOUS

## 2016-08-16 MED ORDER — GLYCOPYRROLATE 0.2 MG/ML IJ SOLN: 0.2000 mg | Freq: Once | INTRAMUSCULAR | Status: DC | PRN

## 2016-08-16 MED ORDER — ONDANSETRON HCL 4 MG/2ML IJ SOLN
INTRAMUSCULAR | Status: DC | PRN
  Administered 2016-08-16: 4 mg via INTRAVENOUS

## 2016-08-16 MED ORDER — 0.9 % SODIUM CHLORIDE (POUR BTL) OPTIME
TOPICAL | Status: DC | PRN
  Administered 2016-08-16: 1000 mL

## 2016-08-16 MED ORDER — LACTATED RINGERS IV SOLN
INTRAVENOUS | Status: DC
Start: 2016-08-16 — End: 2016-08-16

## 2016-08-16 MED ORDER — BUPIVACAINE-EPINEPHRINE (PF) 0.5% -1:200000 IJ SOLN
INTRAMUSCULAR | Status: DC | PRN
  Administered 2016-08-16: 25 mL via PERINEURAL

## 2016-08-16 MED ORDER — PROPOFOL 10 MG/ML IV BOLUS
INTRAVENOUS | Status: DC | PRN
  Administered 2016-08-16: 50 mg via INTRAVENOUS
  Administered 2016-08-16: 200 mg via INTRAVENOUS

## 2016-08-16 MED ORDER — HYDROMORPHONE HCL 1 MG/ML IJ SOLN: 0.2500 mg | INTRAMUSCULAR | Status: DC | PRN

## 2016-08-16 MED ORDER — SCOPOLAMINE 1 MG/3DAYS TD PT72: 1.0000 | MEDICATED_PATCH | Freq: Once | TRANSDERMAL | Status: DC | PRN

## 2016-08-16 MED ORDER — LACTATED RINGERS IV SOLN
INTRAVENOUS | Status: DC
  Administered 2016-08-16 (×2): via INTRAVENOUS

## 2016-08-16 MED ORDER — CEFAZOLIN SODIUM-DEXTROSE 2-4 GM/100ML-% IV SOLN
INTRAVENOUS | Status: AC
  Filled 2016-08-16: qty 100

## 2016-08-16 MED ORDER — PROMETHAZINE HCL 25 MG/ML IJ SOLN: 6.2500 mg | INTRAMUSCULAR | Status: DC | PRN

## 2016-08-16 MED ORDER — PROPOFOL 10 MG/ML IV BOLUS
INTRAVENOUS | Status: AC
  Filled 2016-08-16: qty 20

## 2016-08-16 MED ORDER — MEPERIDINE HCL 25 MG/ML IJ SOLN: 6.2500 mg | INTRAMUSCULAR | Status: DC | PRN

## 2016-08-16 MED ORDER — LIDOCAINE HCL (CARDIAC) 20 MG/ML IV SOLN
INTRAVENOUS | Status: DC | PRN
  Administered 2016-08-16: 30 mg via INTRAVENOUS

## 2016-08-16 SURGICAL SUPPLY — 70 items
BIT DRILL MINI LNG ACUTRAK 2 (BIT) IMPLANT
BLADE MINI RND TIP GREEN BEAV (BLADE) IMPLANT
BLADE SURG 15 STRL LF DISP TIS (BLADE) ×1 IMPLANT
BLADE SURG 15 STRL SS (BLADE) ×3
BNDG CMPR 9X4 STRL LF SNTH (GAUZE/BANDAGES/DRESSINGS) ×1
BNDG COHESIVE 4X5 TAN STRL (GAUZE/BANDAGES/DRESSINGS) ×3 IMPLANT
BNDG COHESIVE 6X5 TAN STRL LF (GAUZE/BANDAGES/DRESSINGS) IMPLANT
BNDG ESMARK 4X9 LF (GAUZE/BANDAGES/DRESSINGS) ×3 IMPLANT
BNDG GAUZE ELAST 4 BULKY (GAUZE/BANDAGES/DRESSINGS) ×6 IMPLANT
BRUSH SCRUB EZ PLAIN DRY (MISCELLANEOUS) IMPLANT
CANISTER SUCT 1200ML W/VALVE (MISCELLANEOUS) ×3 IMPLANT
CHLORAPREP W/TINT 26ML (MISCELLANEOUS) ×3 IMPLANT
CORDS BIPOLAR (ELECTRODE) ×3 IMPLANT
COVER BACK TABLE 60X90IN (DRAPES) ×3 IMPLANT
COVER MAYO STAND STRL (DRAPES) ×3 IMPLANT
CUFF TOURNIQUET SINGLE 18IN (TOURNIQUET CUFF) ×2 IMPLANT
CUFF TOURNIQUET SINGLE 24IN (TOURNIQUET CUFF) IMPLANT
DRAIN PENROSE 1/4X12 LTX STRL (WOUND CARE) IMPLANT
DRAPE C-ARM 42X72 X-RAY (DRAPES) ×3 IMPLANT
DRAPE EXTREMITY T 121X128X90 (DRAPE) IMPLANT
DRAPE U-SHAPE 47X51 STRL (DRAPES) ×3 IMPLANT
DRAPE U-SHAPE 76X120 STRL (DRAPES) IMPLANT
DRILL MINI LNG ACUTRAK 2 (BIT) ×3
DRSG ADAPTIC 3X8 NADH LF (GAUZE/BANDAGES/DRESSINGS) ×3 IMPLANT
DRSG EMULSION OIL 3X3 NADH (GAUZE/BANDAGES/DRESSINGS) IMPLANT
ELECT REM PT RETURN 9FT ADLT (ELECTROSURGICAL) ×3
ELECTRODE REM PT RTRN 9FT ADLT (ELECTROSURGICAL) ×1 IMPLANT
GAUZE SPONGE 4X4 12PLY STRL (GAUZE/BANDAGES/DRESSINGS) ×3 IMPLANT
GLOVE BIO SURGEON STRL SZ7.5 (GLOVE) ×3 IMPLANT
GLOVE BIOGEL PI IND STRL 7.0 (GLOVE) ×1 IMPLANT
GLOVE BIOGEL PI IND STRL 8 (GLOVE) ×1 IMPLANT
GLOVE BIOGEL PI INDICATOR 7.0 (GLOVE) ×4
GLOVE BIOGEL PI INDICATOR 8 (GLOVE) ×2
GLOVE ECLIPSE 6.5 STRL STRAW (GLOVE) ×5 IMPLANT
GOWN STRL REUS W/ TWL LRG LVL3 (GOWN DISPOSABLE) ×2 IMPLANT
GOWN STRL REUS W/TWL LRG LVL3 (GOWN DISPOSABLE) ×6
GOWN STRL REUS W/TWL XL LVL3 (GOWN DISPOSABLE) ×3 IMPLANT
GUIDEWIRE ORTHO MINI ACTK .045 (WIRE) ×4 IMPLANT
NDL HYPO 25X1 1.5 SAFETY (NEEDLE) IMPLANT
NEEDLE HYPO 25X1 1.5 SAFETY (NEEDLE) IMPLANT
NS IRRIG 1000ML POUR BTL (IV SOLUTION) ×3 IMPLANT
PACK BASIN DAY SURGERY FS (CUSTOM PROCEDURE TRAY) ×3 IMPLANT
PADDING CAST ABS 4INX4YD NS (CAST SUPPLIES) ×4
PADDING CAST ABS COTTON 4X4 ST (CAST SUPPLIES) IMPLANT
PENCIL BUTTON HOLSTER BLD 10FT (ELECTRODE) ×3 IMPLANT
RUBBERBAND STERILE (MISCELLANEOUS) IMPLANT
SCREW ACUTRAK 2 MINI 22MM (Screw) ×2 IMPLANT
SCREW ACUTRAK 2 MINI 24MM (Screw) ×2 IMPLANT
SLEEVE SCD COMPRESS KNEE MED (MISCELLANEOUS) ×1 IMPLANT
SLING ARM FOAM STRAP LRG (SOFTGOODS) ×2 IMPLANT
SLING ARM IMMOBILIZER LRG (SOFTGOODS) IMPLANT
SPLINT FIBERGLASS 3X35 (CAST SUPPLIES) IMPLANT
SPONGE LAP 18X18 X RAY DECT (DISPOSABLE) ×3 IMPLANT
STAPLER VISISTAT 35W (STAPLE) IMPLANT
STOCKINETTE 6  STRL (DRAPES) ×2
STOCKINETTE 6 STRL (DRAPES) ×1 IMPLANT
SUCTION FRAZIER HANDLE 10FR (MISCELLANEOUS) ×2
SUCTION TUBE FRAZIER 10FR DISP (MISCELLANEOUS) IMPLANT
SUT VIC AB 2-0 PS2 27 (SUTURE) ×3 IMPLANT
SUT VICRYL 4-0 PS2 18IN ABS (SUTURE) IMPLANT
SUT VICRYL RAPIDE 4-0 (SUTURE) IMPLANT
SUT VICRYL RAPIDE 4/0 PS 2 (SUTURE) IMPLANT
SYR BULB 3OZ (MISCELLANEOUS) ×3 IMPLANT
SYRINGE 10CC LL (SYRINGE) IMPLANT
TOWEL OR 17X24 6PK STRL BLUE (TOWEL DISPOSABLE) ×6 IMPLANT
TOWEL OR NON WOVEN STRL DISP B (DISPOSABLE) ×3 IMPLANT
TUBE CONNECTING 20'X1/4 (TUBING) ×1
TUBE CONNECTING 20X1/4 (TUBING) ×2 IMPLANT
UNDERPAD 30X30 (UNDERPADS AND DIAPERS) ×2 IMPLANT
YANKAUER SUCT BULB TIP NO VENT (SUCTIONS) ×3 IMPLANT

## 2016-08-16 NOTE — Op Note (Addendum)
08/16/2016  12:51 PM  PATIENT:  Rita Berry  60 y.o. female  PRE-OPERATIVE DIAGNOSIS:  Displaced intra-articular left distal humerus fracture  POST-OPERATIVE DIAGNOSIS:  Same  PROCEDURE:  ORIF left displaced intra-articular distal humerus fracture  SURGEON: Cliffton Astersavid A. Janee Mornhompson, MD  PHYSICIAN ASSISTANT: Danielle RankinKirsten Schrader, OPA-C  ANESTHESIA:  regional and general  SPECIMENS:  None  DRAINS:   None  EBL:  less than 50 mL  PREOPERATIVE INDICATIONS:  This patient is a  60 y.o. female with a displaced coronal shear fracture of the distal humerus on the left.  The risks benefits and alternatives were discussed with the patient preoperatively including but not limited to the risks of infection, bleeding, nerve injury, cardiopulmonary complications, the need for revision surgery, among others, and the patient verbalized understanding and consented to proceed.  OPERATIVE IMPLANTS: Acutrak standard and mini screw, one each  OPERATIVE PROCEDURE:  After receiving prophylactic antibiotics and a regional block, the patient was escorted to the operative theatre and placed in a supine position.  General anesthesia was administered.  A surgical "time-out" was performed during which the planned procedure, proposed operative site, and the correct patient identity were compared to the operative consent and agreement confirmed by the circulating nurse according to current facility policy.  Following application of a tourniquet to the operative extremity, the exposed skin was prepped with Chloraprep and draped in the usual sterile fashion.  The limb was exsanguinated with an Esmarch bandage and the tourniquet inflated to approximately 100mmHg higher than systolic BP.  A curvilinear slight sinusoidal-shaped incision was marked over the anterior aspect of the elbow, with the transverse limb corresponding to the antecubital crease, and running from proximal lateral to distal medial.  Incision was made  sharply with a scalpel, subcutaneous tissues were dissected with blunt and spreading dissection.  The lateral antebrachial cutaneous nerve was not encountered in the incision or the deeper interval.  The natural plane between the biceps and the brachioradialis was exploited.  Radial nerve was identified and retracted radially with the brachioradialis, although not with direct retraction against it.  The brachialis was swept off of the anterior capsule and the hemarthrosis emanated forth from the region the capsule.  A lateral anterior capsulectomy was performed.  The fracture of the distal humerus was thus identified.  The large fracture fragment reduced nicely and was stable.  2 Mini-acutrak screws were placed in standard fashion from anterior to posterior through the fragment, securing it nicely.  Final fluoroscopic images were obtained revealing near-anatomic alignment of the distal humerus with hardware that was not intra-articular.  The screws were certainly deep enough anteriorly as they were evaluated with direct visualization and found to rest below the chondral surface.  Motion of the elbow was smooth and fluid, from full extension to flexion and there was no crepitus, etc. with elbow motion or with forearm rotation.  The tourniquet was released, the wound irrigated, and the skin closed with 3-0 Vicryl interrupted deep dermal buried sutures and a running 4-0 Vicryl Rapide horizontal mattress suture in the skin.  A splint dressing was applied with a long-arm fiberglass splint component.  She was awakened and taken to the recovery room in stable condition, breathing spontaneously.    DISPOSITION: She will be discharged home today with typical instructions, returning in 10-15 days with new x-rays of the right elbow out of splint and likely initiation of range of motion at that time.

## 2016-08-16 NOTE — Anesthesia Preprocedure Evaluation (Signed)
Anesthesia Evaluation  Patient identified by MRN, date of birth, ID band Patient awake    Reviewed: Allergy & Precautions, NPO status , Patient's Chart, lab work & pertinent test results  Airway Mallampati: I  TM Distance: >3 FB Neck ROM: Full    Dental  (+) Teeth Intact, Dental Advisory Given   Pulmonary former smoker,    breath sounds clear to auscultation       Cardiovascular hypertension, Pt. on medications  Rhythm:Regular Rate:Normal     Neuro/Psych    GI/Hepatic   Endo/Other    Renal/GU      Musculoskeletal   Abdominal   Peds  Hematology   Anesthesia Other Findings   Reproductive/Obstetrics                             Anesthesia Physical Anesthesia Plan  ASA: II  Anesthesia Plan: General   Post-op Pain Management: GA combined w/ Regional for post-op pain   Induction: Intravenous  Airway Management Planned: LMA  Additional Equipment:   Intra-op Plan:   Post-operative Plan: Extubation in OR  Informed Consent: I have reviewed the patients History and Physical, chart, labs and discussed the procedure including the risks, benefits and alternatives for the proposed anesthesia with the patient or authorized representative who has indicated his/her understanding and acceptance.   Dental advisory given  Plan Discussed with: CRNA, Anesthesiologist and Surgeon  Anesthesia Plan Comments:         Anesthesia Quick Evaluation

## 2016-08-16 NOTE — Anesthesia Procedure Notes (Addendum)
Anesthesia Regional Block:  Infraclavicular brachial plexus block  Pre-Anesthetic Checklist: ,, timeout performed, Correct Patient, Correct Site, Correct Laterality, Correct Procedure, Correct Position, site marked, Risks and benefits discussed,  Surgical consent,  Pre-op evaluation,  At surgeon's request and post-op pain management  Laterality: Left and Upper  Prep: chloraprep       Needles:  Injection technique: Single-shot  Needle Type: Echogenic Stimulator Needle     Needle Length: 5cm 5 cm Needle Gauge: 21 and 21 G    Additional Needles:  Procedures: ultrasound guided (picture in chart) Infraclavicular brachial plexus block Narrative:  Start time: 08/16/2016 1:58 PM End time: 08/16/2016 2:03 PM Injection made incrementally with aspirations every 5 mL.  Performed by: Personally  Anesthesiologist: Brayn Eckstein       Left infraclavicular block image

## 2016-08-16 NOTE — Transfer of Care (Signed)
Immediate Anesthesia Transfer of Care Note  Patient: Rita Berry  Procedure(s) Performed: Procedure(s) with comments: OPEN TREATMENT OF LEFT DISTAL HUMERUS FRACTURE (Left) - GENERAL ANESTHESIA WITH PRE-OP BLOCK  Patient Location: PACU  Anesthesia Type:GA combined with regional for post-op pain  Level of Consciousness: awake and patient cooperative  Airway & Oxygen Therapy: Patient Spontanous Breathing and Patient connected to face mask oxygen  Post-op Assessment: Report given to RN and Post -op Vital signs reviewed and stable  Post vital signs: Reviewed and stable  Last Vitals:  Vitals:   08/16/16 1253  BP: (!) 164/79  Pulse: 72  Resp: 18  Temp: 36.9 C    Last Pain:  Vitals:   08/16/16 1253  TempSrc: Oral  PainSc:       Patients Stated Pain Goal: 2 (08/12/16 1252)  Complications: No apparent anesthesia complications

## 2016-08-16 NOTE — Anesthesia Preprocedure Evaluation (Addendum)
Anesthesia Evaluation  Patient identified by MRN, date of birth, ID band Patient awake    Reviewed: Allergy & Precautions, NPO status , Patient's Chart, lab work & pertinent test results  History of Anesthesia Complications (+) PONV and history of anesthetic complications  Airway Mallampati: II  TM Distance: >3 FB Neck ROM: Full    Dental no notable dental hx.    Pulmonary former smoker,    Pulmonary exam normal breath sounds clear to auscultation       Cardiovascular hypertension, Normal cardiovascular exam Rhythm:Regular Rate:Normal     Neuro/Psych negative neurological ROS  negative psych ROS   GI/Hepatic negative GI ROS, Neg liver ROS,   Endo/Other  negative endocrine ROS  Renal/GU negative Renal ROS  negative genitourinary   Musculoskeletal  (+) Arthritis , Osteoarthritis,    Abdominal   Peds negative pediatric ROS (+)  Hematology negative hematology ROS (+)   Anesthesia Other Findings   Reproductive/Obstetrics negative OB ROS                            Anesthesia Physical Anesthesia Plan  ASA: II  Anesthesia Plan: General   Post-op Pain Management: GA combined w/ Regional for post-op pain   Induction: Intravenous  Airway Management Planned: LMA  Additional Equipment:   Intra-op Plan:   Post-operative Plan: Extubation in OR  Informed Consent: I have reviewed the patients History and Physical, chart, labs and discussed the procedure including the risks, benefits and alternatives for the proposed anesthesia with the patient or authorized representative who has indicated his/her understanding and acceptance.   Dental advisory given  Plan Discussed with: CRNA  Anesthesia Plan Comments:         Anesthesia Quick Evaluation

## 2016-08-16 NOTE — Interval H&P Note (Signed)
History and Physical Interval Note:  08/16/2016 12:50 PM  Rita Berry  has presented today for surgery, with the diagnosis of LEFT DISTAL HUMERUS FX  The various methods of treatment have been discussed with the patient and family. After consideration of risks, benefits and other options for treatment, the patient has consented to  Procedure(s) with comments: OPEN TREATMENT OF LEFT DISTAL HUMERUS FRACTURE (Left) - GENERAL ANESTHESIA WITH PRE-OP BLOCK as a surgical intervention .  The patient's history has been reviewed, patient examined, no change in status, stable for surgery.  I have reviewed the patient's chart and labs.  Questions were answered to the patient's satisfaction.     Khaniya Tenaglia A.

## 2016-08-16 NOTE — Anesthesia Procedure Notes (Signed)
Procedure Name: LMA Insertion Date/Time: 08/16/2016 3:57 PM Performed by: Zenia ResidesPAYNE, Edilson Vital D Pre-anesthesia Checklist: Patient identified, Emergency Drugs available, Suction available and Patient being monitored Patient Re-evaluated:Patient Re-evaluated prior to inductionOxygen Delivery Method: Circle system utilized Preoxygenation: Pre-oxygenation with 100% oxygen Intubation Type: IV induction Ventilation: Mask ventilation without difficulty LMA: LMA inserted LMA Size: 3.0 Number of attempts: 1 Airway Equipment and Method: Bite block Placement Confirmation: positive ETCO2 Tube secured with: Tape Dental Injury: Teeth and Oropharynx as per pre-operative assessment

## 2016-08-16 NOTE — Discharge Instructions (Signed)
Discharge Instructions   You have a dressing with a plaster splint incorporated in it. Move your fingers as much as possible, making a full fist and fully opening the fist. Elevate your hand to reduce pain & swelling of the digits. Utilize sling for support.  Ice over the operative site may be helpful to reduce pain & swelling.  DO NOT USE HEAT. Pain medicine has been prescribed for you.  Use your medicine as needed over the first 48 hours, and then you can begin to taper your use.  You may use Tylenol in place of your prescribed pain medication, but not IN ADDITION to it. Leave the dressing in place until you return to our office.  You may shower, but keep the bandage clean & dry.  You may drive a car when you are off of prescription pain medications and can safely control your vehicle with both hands. Call to make an appointment for 10-15 days from the date of surgery.  Please call [phone] during normal business hours or [phone] after hours for any problems. Including the following:  - excessive redness of the incisions - drainage for more than 4 days - fever of more than 101.5 F  *Please note that pain medications will not be refilled after hours or on weekends.    Post Anesthesia Home Care Instructions  Activity: Get plenty of rest for the remainder of the day. A responsible adult should stay with you for 24 hours following the procedure.  For the next 24 hours, DO NOT: -Drive a car -Advertising copywriterperate machinery -Drink alcoholic beverages -Take any medication unless instructed by your physician -Make any legal decisions or sign important papers.  Meals: Start with liquid foods such as gelatin or soup. Progress to regular foods as tolerated. Avoid greasy, spicy, heavy foods. If nausea and/or vomiting occur, drink only clear liquids until the nausea and/or vomiting subsides. Call your physician if vomiting continues.  Special Instructions/Symptoms: Your throat may feel dry or  sore from the anesthesia or the breathing tube placed in your throat during surgery. If this causes discomfort, gargle with warm salt water. The discomfort should disappear within 24 hours.  If you had a scopolamine patch placed behind your ear for the management of post- operative nausea and/or vomiting:  1. The medication in the patch is effective for 72 hours, after which it should be removed.  Wrap patch in a tissue and discard in the trash. Wash hands thoroughly with soap and water. 2. You may remove the patch earlier than 72 hours if you experience unpleasant side effects which may include dry mouth, dizziness or visual disturbances. 3. Avoid touching the patch. Wash your hands with soap and water after contact with the patch.    Regional Anesthesia Blocks  1. Numbness or the inability to move the "blocked" extremity may last from 3-48 hours after placement. The length of time depends on the medication injected and your individual response to the medication. If the numbness is not going away after 48 hours, call your surgeon.  2. The extremity that is blocked will need to be protected until the numbness is gone and the  Strength has returned. Because you cannot feel it, you will need to take extra care to avoid injury. Because it may be weak, you may have difficulty moving it or using it. You may not know what position it is in without looking at it while the block is in effect.  3. For blocks in the legs and  feet, returning to weight bearing and walking needs to be done carefully. You will need to wait until the numbness is entirely gone and the strength has returned. You should be able to move your leg and foot normally before you try and bear weight or walk. You will need someone to be with you when you first try to ensure you do not fall and possibly risk injury.  4. Bruising and tenderness at the needle site are common side effects and will resolve in a few days.  5. Persistent numbness  or new problems with movement should be communicated to the surgeon or the [facility] [phone] [facility] [phone]).

## 2016-08-16 NOTE — Progress Notes (Signed)
  Assisted Dr. Crews with left, ultrasound guided, supraclavicular block. Side rails up, monitors on throughout procedure. See vital signs in flow sheet. Tolerated Procedure well. 

## 2016-08-17 ENCOUNTER — Encounter (HOSPITAL_BASED_OUTPATIENT_CLINIC_OR_DEPARTMENT_OTHER): Payer: Self-pay | Admitting: Orthopedic Surgery

## 2016-08-17 NOTE — Anesthesia Postprocedure Evaluation (Signed)
Anesthesia Post Note  Patient: Rita Berry  Procedure(s) Performed: Procedure(s) (LRB): OPEN TREATMENT OF LEFT DISTAL HUMERUS FRACTURE (Left)  Patient location during evaluation: PACU Anesthesia Type: General and Regional Level of consciousness: awake and alert Pain management: pain level controlled Vital Signs Assessment: post-procedure vital signs reviewed and stable Respiratory status: spontaneous breathing, nonlabored ventilation, respiratory function stable and patient connected to nasal cannula oxygen Cardiovascular status: blood pressure returned to baseline and stable Postop Assessment: no signs of nausea or vomiting Anesthetic complications: no     Last Vitals:  Vitals:   08/16/16 1733 08/16/16 1803  BP: 120/78 (!) 160/90  Pulse:  71  Resp:  18  Temp:  36.5 C    Last Pain:  Vitals:   08/17/16 0953  TempSrc:   PainSc: 0-No pain   Pain Goal: Patients Stated Pain Goal: 2 (08/12/16 1252)               Phillips Groutarignan, Markala Sitts

## 2016-08-26 ENCOUNTER — Telehealth: Payer: Self-pay | Admitting: *Deleted

## 2016-08-26 NOTE — Telephone Encounter (Signed)
-----   Message from Lizbeth BarkMelanie L Ceresi sent at 08/25/2016 10:20 AM EDT ----- Regarding: just an fyi Pt is having rt hip surgery sometime in dec with Dr. Charlann Boxerlin. She just wanted to update everyone.

## 2016-09-07 ENCOUNTER — Ambulatory Visit (INDEPENDENT_AMBULATORY_CARE_PROVIDER_SITE_OTHER): Payer: PRIVATE HEALTH INSURANCE | Admitting: Physician Assistant

## 2016-09-07 VITALS — BP 134/82 | HR 73 | Temp 98.1°F | Resp 16 | Ht 66.0 in | Wt 130.8 lb

## 2016-09-07 DIAGNOSIS — H9192 Unspecified hearing loss, left ear: Secondary | ICD-10-CM | POA: Diagnosis not present

## 2016-09-07 MED ORDER — AMOXICILLIN 875 MG PO TABS: 875.0000 mg | ORAL_TABLET | Freq: Two times a day (BID) | ORAL | 0 refills | Status: DC

## 2016-09-07 NOTE — Patient Instructions (Addendum)
Take  Zyrtec 5120 for the next 5 days and if not better fill Amoxicillin.    IF you received an x-ray today, you will receive an invoice from [facility]. Please contact [facility] at [phone] with questions or concerns regarding your invoice.   IF you received labwork today, you will receive an invoice from [facility]. Please contact [facility] at [phone] with questions or concerns regarding your invoice.   Our billing staff will not be able to assist you with questions regarding bills from these companies.  You will be contacted with the lab results as soon as they are available. The fastest way to get your results is to activate your My Chart account. Instructions are located on the last page of this paperwork. If you have not heard from us regarding the results in 2 weeks, please contact this office.

## 2016-09-07 NOTE — Progress Notes (Signed)
   09/07/2016 3:14 PM   DOB: 1956-12-15 / MRN: [id number]  SUBJECTIVE:  Rita Berry is a 60 y.o. female presenting for change in hearing. This started about 1 week ago.  She has a history of cerumen impaction bilaterally and feels this is the cause again.  She has an excellent ear care regimen at home. Reports that her hearing has decreased in the right side feels that her hearing is "in a tin can."   She is allergic to codeine and latex.   She  has a past medical history of Arthritis; CIN I (cervical intraepithelial neoplasia I); Complication of anesthesia; Humerus distal fracture; Hypertension; Ovarian cyst; and PONV (postoperative nausea and vomiting).    She  reports that she has quit smoking. She has never used smokeless tobacco. She reports that she drinks about 5.4 oz of alcohol per week . She reports that she does not use drugs. She  reports that she does not currently engage in sexual activity but has had female partners.; The patient  has a past surgical history that includes Gynecologic cryosurgery; Colposcopy; Abdominal hysterectomy; Abdominal surgery; Oophorectomy; Tubal ligation; and ORIF humerus fracture (Left, 08/16/2016).  Her family history includes Bradycardia in her mother; Dementia in her mother; Diabetes in her maternal grandmother and sister; Hyperlipidemia in her father; Hypertension in her father and mother; Stroke in her maternal grandfather and maternal grandmother.  Review of Systems  Constitutional: Negative for chills and fever.  HENT: Negative for congestion.   Respiratory: Negative for cough.   Gastrointestinal: Negative for nausea.  Musculoskeletal: Negative for myalgias.  Skin: Negative for itching and rash.  Neurological: Negative for dizziness and headaches.    The problem list and medications were reviewed and updated by myself where necessary and exist elsewhere in the encounter.   OBJECTIVE:  BP 134/82 (BP Location: Right Arm, Patient Position: Sitting,  Cuff Size: Normal)   Pulse 73   Temp 98.1 F (36.7 C) (Oral)   Resp 16   Ht 5\' 6"  (1.676 m)   Wt 130 lb 12.8 oz (59.3 kg)   SpO2 98%   BMI 21.11 kg/m   Physical Exam  Constitutional: She appears well-developed and well-nourished.  HENT:  Right Ear: Tympanic membrane normal.  Left Ear: Tympanic membrane normal.  Nose: Nose normal.  Mouth/Throat: Uvula is midline, oropharynx is clear and moist and mucous membranes are normal.    No results found for this or any previous visit (from the past 72 hour(s)).  No results found.  ASSESSMENT AND PLAN  Rita Berry was seen today for cerumen impaction.  Diagnoses and all orders for this visit:  Change in hearing of left ear Comments: Possibly serous otitis media or ETD.  Her ear canal is clear and without signs of externa. Will try her on Zyrtec D and Amox in five days if not better.  Orders: -     amoxicillin (AMOXIL) 875 MG tablet; Take 1 tablet (875 mg total) by mouth 2 (two) times daily.    The patient is advised to call or return to clinic if she does not see an improvement in symptoms, or to seek the care of the closest emergency department if she worsens with the above plan.   Deliah BostonMichael Clark, MHS, PA-C [facility] 09/07/2016 3:14 PM

## 2016-09-12 ENCOUNTER — Other Ambulatory Visit: Payer: Self-pay | Admitting: *Deleted

## 2016-09-12 MED ORDER — BACLOFEN 2 % EX CREA: 2.0000 g | TOPICAL_CREAM | Freq: Two times a day (BID) | CUTANEOUS | 3 refills | Status: DC

## 2016-09-13 ENCOUNTER — Ambulatory Visit: Payer: PRIVATE HEALTH INSURANCE | Admitting: Internal Medicine

## 2016-09-14 NOTE — Progress Notes (Signed)
Scheduling pre op-  Please place SURGICAL ORDERS IN EPIC  thanks 

## 2016-09-20 ENCOUNTER — Encounter: Payer: Self-pay | Admitting: Physician Assistant

## 2016-09-20 ENCOUNTER — Ambulatory Visit (INDEPENDENT_AMBULATORY_CARE_PROVIDER_SITE_OTHER): Payer: PRIVATE HEALTH INSURANCE | Admitting: Physician Assistant

## 2016-09-20 VITALS — BP 116/78 | HR 76 | Temp 98.7°F | Resp 18 | Ht 66.0 in | Wt 129.0 lb

## 2016-09-20 DIAGNOSIS — Z23 Encounter for immunization: Secondary | ICD-10-CM

## 2016-09-20 DIAGNOSIS — M1611 Unilateral primary osteoarthritis, right hip: Secondary | ICD-10-CM

## 2016-09-20 DIAGNOSIS — Z01818 Encounter for other preprocedural examination: Secondary | ICD-10-CM | POA: Diagnosis not present

## 2016-09-20 LAB — BASIC METABOLIC PANEL
BUN: 14 mg/dL (ref 7–25)
CALCIUM: 9.4 mg/dL (ref 8.6–10.4)
CHLORIDE: 103 mmol/L (ref 98–110)
CO2: 29 mmol/L (ref 20–31)
CREATININE: 0.68 mg/dL (ref 0.50–0.99)
Glucose, Bld: 87 mg/dL (ref 65–99)
Potassium: 4.1 mmol/L (ref 3.5–5.3)
Sodium: 140 mmol/L (ref 135–146)

## 2016-09-20 LAB — CBC WITH DIFFERENTIAL/PLATELET
Basophils Absolute: 0 {cells}/uL (ref 0–200)
Basophils Relative: 0 %
Eosinophils Absolute: 47 {cells}/uL (ref 15–500)
Eosinophils Relative: 1 %
HCT: 39.5 % (ref 35.0–45.0)
Hemoglobin: 13.6 g/dL (ref 11.7–15.5)
Lymphocytes Relative: 35 %
Lymphs Abs: 1645 {cells}/uL (ref 850–3900)
MCH: 32.3 pg (ref 27.0–33.0)
MCHC: 34.4 g/dL (ref 32.0–36.0)
MCV: 93.8 fL (ref 80.0–100.0)
MPV: 9.8 fL (ref 7.5–12.5)
Monocytes Absolute: 470 {cells}/uL (ref 200–950)
Monocytes Relative: 10 %
Neutro Abs: 2538 {cells}/uL (ref 1500–7800)
Neutrophils Relative %: 54 %
Platelets: 249 K/uL (ref 140–400)
RBC: 4.21 MIL/uL (ref 3.80–5.10)
RDW: 12.8 % (ref 11.0–15.0)
WBC: 4.7 K/uL (ref 3.8–10.8)

## 2016-09-20 NOTE — Progress Notes (Signed)
Patient ID: Rita Berry, female    DOB: 1956/11/26, 60 y.o.   MRN: [id number]  PCP: Rita Oar, PA-C  Subjective:   Chief Complaint  Patient presents with  . Medical Clearance    HPI Presents for surgical clearance.  She has osteoarthritis of the RIGHT hip and was planning THR this summer, but due to the cost, delayed it, hoping to get by with conservative measures as long as possible. She continued to have pain, however, and is now ready to proceed.  In the interim, she broke the LEFT elbow and has had that surgically repaired.    Review of Systems  Constitutional: Negative.   HENT: Negative for sore throat.   Eyes: Negative for visual disturbance.  Respiratory: Negative for cough, chest tightness, shortness of breath and wheezing.   Cardiovascular: Negative for chest pain and palpitations.  Gastrointestinal: Negative for abdominal pain, diarrhea, nausea and vomiting.  Genitourinary: Negative for dysuria, frequency, hematuria and urgency.  Musculoskeletal: Positive for arthralgias (RIGHT hip) and gait problem (due to hip pain). Negative for myalgias.  Skin: Negative for rash.  Neurological: Negative for dizziness, weakness and headaches.  Psychiatric/Behavioral: Negative for decreased concentration. The patient is not nervous/anxious.        Patient Active Problem List   Diagnosis Date Noted  . Osteoarthritis of right hip 06/14/2016  . Essential hypertension 06/12/2015  . Diverticulosis 06/12/2015  . Pain in joint, ankle and foot 09/16/2014  . Bilateral bunions 09/16/2014  . Iliotibial band syndrome of left side 10/30/2013  . Greater trochanteric bursitis of both hips 10/30/2013  . Abnormality of gait 06/18/2013  . Eucalcemic Hyperparathyroidism 04/19/2013  . Osteopenia 04/19/2013  . CIN I (cervical intraepithelial neoplasia I)   . Ovarian cyst     Past Medical History:  Diagnosis Date  . Arthritis    hip  . CIN I (cervical intraepithelial neoplasia I)    . Complication of anesthesia   . Humerus distal fracture    left  . Hypertension   . Ovarian cyst   . PONV (postoperative nausea and vomiting)     Past Surgical History:  Procedure Laterality Date  . ABDOMINAL HYSTERECTOMY    . ABDOMINAL SURGERY     Ovarian cyst  . COLPOSCOPY    . GYNECOLOGIC CRYOSURGERY    . OOPHORECTOMY     BSO  . ORIF HUMERUS FRACTURE Left 08/16/2016   Procedure: OPEN TREATMENT OF LEFT DISTAL HUMERUS FRACTURE;  Surgeon: Rita Hook, MD;  Location: [facility];  Service: Orthopedics;  Laterality: Left;  GENERAL ANESTHESIA WITH PRE-OP BLOCK  . TUBAL LIGATION      Family History  Problem Relation Age of Onset  . Hypertension Mother   . Dementia Mother   . Bradycardia Mother   . Hypertension Father   . Hyperlipidemia Father   . Diabetes Sister     gestational  . Diabetes Maternal Grandmother   . Stroke Maternal Grandmother   . Stroke Maternal Grandfather     Social History  Substance Use Topics  . Smoking status: Former Games developer  . Smokeless tobacco: Never Used  . Alcohol use 5.4 oz/week    5 Glasses of wine, 4 Standard drinks or equivalent per week     Comment: social     Prior to Admission medications   Medication Sig Start Date End Date Taking? Authorizing Provider  Baclofen 2 % CREA Apply 2 g topically 2 (two) times daily. This is to be compounded with: Diclofenac 5%  Gabapentin 6% Ketamine 10% lidocaine 3% 09/12/16  Yes Rita R Draper, DO  Calcium-Magnesium-Vitamin D (CALCIUM MAGNESIUM PO) Take by mouth. Reported on 06/14/2016   Yes Historical Provider, MD  chlorthalidone (HYGROTON) 25 MG tablet TAKE 1/2 TO 1 TABLET(12.5 TO 25 MG) BY MOUTH DAILY 07/14/16  Yes Rita Devera, PA-C  Cholecalciferol (VITAMIN D PO) Take 2,000 Units by mouth. Reported on 06/14/2016   Yes Historical Provider, MD  desonide (DESOWEN) 0.05 % lotion APPLY TO AFFECTED AREA TWICE DAILY FOR 14 DAYS 07/23/16  Yes Historical Provider, MD  ketoconazole  (NIZORAL) 2 % cream APP AA UNDER BREAST BID 07/22/16  Yes Historical Provider, MD  Multiple Vitamin (MULTIVITAMIN) tablet Take 1 tablet by mouth daily. Reported on 06/14/2016   Yes Historical Provider, MD  nitroGLYCERIN (NITRODUR - DOSED IN MG/24 HR) 0.2 mg/hr patch APPLY 1/2 PATCH ONTO SKIN EVERY 24 HOURS 05/12/15  Yes Rita BaasKarl Fields, MD  traMADol Janean Sark(ULTRAM) 50 MG tablet Take one tab daily or twice a day as needed 06/29/16  Yes Rita Corkimothy R Draper, DO     Allergies  Allergen Reactions  . Codeine Nausea And Vomiting  . Latex Itching       Objective:  Physical Exam  Constitutional: She is oriented to person, place, and time. She appears well-developed and well-nourished. She is active and cooperative. No distress.  BP 116/78 (BP Location: Right Arm, Patient Position: Sitting, Cuff Size: Small)   Pulse 76   Temp 98.7 F (37.1 C) (Oral)   Resp 18   Ht 5\' 6"  (1.676 m)   Wt 129 lb (58.5 kg)   SpO2 97%   BMI 20.82 kg/m   HENT:  Head: Normocephalic and atraumatic.  Right Ear: Hearing normal.  Left Ear: Hearing normal.  Eyes: Conjunctivae are normal. No scleral icterus.  Neck: Normal range of motion. Neck supple. No thyromegaly present.  Cardiovascular: Normal rate, regular rhythm and normal heart sounds.   Pulses:      Radial pulses are 2+ on the right side, and 2+ on the left side.  Pulmonary/Chest: Effort normal and breath sounds normal.  Lymphadenopathy:       Head (right side): No tonsillar, no preauricular, no posterior auricular and no occipital adenopathy present.       Head (left side): No tonsillar, no preauricular, no posterior auricular and no occipital adenopathy present.    She has no cervical adenopathy.       Right: No supraclavicular adenopathy present.       Left: No supraclavicular adenopathy present.  Neurological: She is alert and oriented to person, place, and time. No sensory deficit.  Skin: Skin is warm, dry and intact. No rash noted. No cyanosis or erythema. Nails show  no clubbing.  Psychiatric: She has a normal mood and affect. Her speech is normal and behavior is normal.    EKG 06/14/2016 was normal.       Assessment & Plan:   1. Primary osteoarthritis of right hip 2. Preoperative clearance Proceed with surgical intervention as planned. - Basic metabolic panel - CBC with Differential/Platelet  3. Need for prophylactic vaccination and inoculation against influenza - Flu Vaccine QUAD 36+ mos IM   Fernande Brashelle S. Ashni Lonzo, PA-C Physician Assistant-Certified [facility]

## 2016-09-20 NOTE — Patient Instructions (Signed)
     IF you received an x-ray today, you will receive an invoice from [facility]. Please contact [facility] at [phone] with questions or concerns regarding your invoice.   IF you received labwork today, you will receive an invoice from [facility]. Please contact [facility] at [phone] with questions or concerns regarding your invoice.   Our billing staff will not be able to assist you with questions regarding bills from these companies.  You will be contacted with the lab results as soon as they are available. The fastest way to get your results is to activate your My Chart account. Instructions are located on the last page of this paperwork. If you have not heard from us regarding the results in 2 weeks, please contact this office.      

## 2016-09-23 ENCOUNTER — Telehealth: Payer: Self-pay | Admitting: *Deleted

## 2016-09-23 DIAGNOSIS — M1611 Unilateral primary osteoarthritis, right hip: Secondary | ICD-10-CM

## 2016-09-23 NOTE — Telephone Encounter (Signed)
Faxed [phone]  Attn: Darel HongJudy with [facility]

## 2016-10-04 ENCOUNTER — Encounter: Payer: Self-pay | Admitting: Physician Assistant

## 2016-10-07 NOTE — H&P (Signed)
TOTAL HIP ADMISSION H&P  Patient is admitted for right total hip arthroplasty, anterior approach.  Subjective:  Chief Complaint:   Right hip primary OA / pain  HPI: Rita Berry, 60 y.o. female, has a history of pain and functional disability in the right hip(s) due to arthritis and patient has failed non-surgical conservative treatments for greater than 12 weeks to include NSAID's and/or analgesics, corticosteriod injections and activity modification.  Onset of symptoms was gradual starting >10 years ago with gradually worsening course since that time.The patient noted no past surgery on the right hip(s).  Patient currently rates pain in the right hip at 8 out of 10 with activity. Patient has night pain, worsening of pain with activity and weight bearing, trendelenberg gait, pain that interfers with activities of daily living and pain with passive range of motion. Patient has evidence of periarticular osteophytes and joint space narrowing by imaging studies. This condition presents safety issues increasing the risk of falls.  There is no current active infection.   Risks, benefits and expectations were discussed with the patient.  Risks including but not limited to the risk of anesthesia, blood clots, nerve damage, blood vessel damage, failure of the prosthesis, infection and up to and including death.  Patient understand the risks, benefits and expectations and wishes to proceed with surgery.   PCP: Porfirio Oar, PA-C  D/C Plans:      Home  Post-op Meds:       No Rx given  Tranexamic Acid:      To be given - IV    Decadron:      Is to be given  FYI:     ASA  Tramadol and APAP    Patient Active Problem List   Diagnosis Date Noted  . Osteoarthritis of right hip 06/14/2016  . Essential hypertension 06/12/2015  . Diverticulosis 06/12/2015  . Pain in joint, ankle and foot 09/16/2014  . Bilateral bunions 09/16/2014  . Iliotibial band syndrome of left side 10/30/2013  . Greater  trochanteric bursitis of both hips 10/30/2013  . Abnormality of gait 06/18/2013  . Eucalcemic Hyperparathyroidism 04/19/2013  . Osteopenia 04/19/2013  . CIN I (cervical intraepithelial neoplasia I)   . Ovarian cyst    Past Medical History:  Diagnosis Date  . Arthritis    hip  . CIN I (cervical intraepithelial neoplasia I)   . Complication of anesthesia   . Humerus distal fracture    left  . Hypertension   . Ovarian cyst   . PONV (postoperative nausea and vomiting)     Past Surgical History:  Procedure Laterality Date  . ABDOMINAL HYSTERECTOMY    . ABDOMINAL SURGERY     Ovarian cyst  . COLPOSCOPY    . GYNECOLOGIC CRYOSURGERY    . OOPHORECTOMY     BSO  . ORIF HUMERUS FRACTURE Left 08/16/2016   Procedure: OPEN TREATMENT OF LEFT DISTAL HUMERUS FRACTURE;  Surgeon: Mack Hook, MD;  Location: [facility];  Service: Orthopedics;  Laterality: Left;  GENERAL ANESTHESIA WITH PRE-OP BLOCK  . TUBAL LIGATION      No prescriptions prior to admission.   Allergies  Allergen Reactions  . Codeine Nausea And Vomiting  . Latex Itching    Social History  Substance Use Topics  . Smoking status: Former Games developer  . Smokeless tobacco: Never Used  . Alcohol use 5.4 oz/week    5 Glasses of wine, 4 Standard drinks or equivalent per week     Comment: social  Family History  Problem Relation Age of Onset  . Hypertension Mother   . Dementia Mother   . Bradycardia Mother   . Hypertension Father   . Hyperlipidemia Father   . Diabetes Sister     gestational  . Diabetes Maternal Grandmother   . Stroke Maternal Grandmother   . Stroke Maternal Grandfather      Review of Systems  Constitutional: Negative.   HENT: Negative.   Eyes: Negative.   Respiratory: Negative.   Cardiovascular: Negative.   Gastrointestinal: Negative.   Genitourinary: Negative.   Musculoskeletal: Positive for joint pain.  Skin: Negative.   Neurological: Negative.   Endo/Heme/Allergies: Negative.    Psychiatric/Behavioral: Negative.     Objective:  Physical Exam  Constitutional: She is oriented to person, place, and time. She appears well-developed.  HENT:  Head: Normocephalic.  Eyes: Pupils are equal, round, and reactive to light.  Neck: Neck supple. No JVD present. No tracheal deviation present. No thyromegaly present.  Cardiovascular: Normal rate, regular rhythm, normal heart sounds and intact distal pulses.   Respiratory: Effort normal and breath sounds normal. No respiratory distress. She has no wheezes.  GI: Soft. There is no tenderness. There is no guarding.  Lymphadenopathy:    She has no cervical adenopathy.  Neurological: She is alert and oriented to person, place, and time.  Skin: Skin is warm and dry.  Psychiatric: She has a normal mood and affect.     Labs:  Estimated body mass index is 20.82 kg/m as calculated from the following:   Height as of 09/20/16: 5\' 6"  (1.676 m).   Weight as of 09/20/16: 58.5 kg (129 lb).   Imaging Review Plain radiographs demonstrate severe degenerative joint disease of the right hip(s). The bone quality appears to be good for age and reported activity level.  Assessment/Plan:  End stage arthritis, right hip(s)  The patient history, physical examination, clinical judgement of the provider and imaging studies are consistent with end stage degenerative joint disease of the right hip(s) and total hip arthroplasty is deemed medically necessary. The treatment options including medical management, injection therapy, arthroscopy and arthroplasty were discussed at length. The risks and benefits of total hip arthroplasty were presented and reviewed. The risks due to aseptic loosening, infection, stiffness, dislocation/subluxation,  thromboembolic complications and other imponderables were discussed.  The patient acknowledged the explanation, agreed to proceed with the plan and consent was signed. Patient is being admitted for inpatient  treatment for surgery, pain control, PT, OT, prophylactic antibiotics, VTE prophylaxis, progressive ambulation and ADL's and discharge planning.The patient is planning to be discharged home with home health services.      Anastasio AuerbachMatthew S. Isolde Skaff   PA-C  10/07/2016, 12:29 PM

## 2016-10-10 ENCOUNTER — Encounter (HOSPITAL_COMMUNITY): Payer: Self-pay

## 2016-10-10 ENCOUNTER — Encounter (HOSPITAL_COMMUNITY)
Admission: RE | Admit: 2016-10-10 | Discharge: 2016-10-10 | Disposition: A | Discharge status: Home / Self Care | Payer: PRIVATE HEALTH INSURANCE | Source: Ambulatory Visit | Attending: Orthopedic Surgery | Admitting: Orthopedic Surgery

## 2016-10-10 DIAGNOSIS — M1611 Unilateral primary osteoarthritis, right hip: Secondary | ICD-10-CM | POA: Diagnosis not present

## 2016-10-10 DIAGNOSIS — Z01818 Encounter for other preprocedural examination: Secondary | ICD-10-CM | POA: Insufficient documentation

## 2016-10-10 HISTORY — DX: Dizziness and giddiness: R42

## 2016-10-10 LAB — BASIC METABOLIC PANEL
Anion gap: 8 (ref 5–15)
BUN: 13 mg/dL (ref 6–20)
CHLORIDE: 103 mmol/L (ref 101–111)
CO2: 25 mmol/L (ref 22–32)
CREATININE: 0.64 mg/dL (ref 0.44–1.00)
Calcium: 9 mg/dL (ref 8.9–10.3)
GFR calc non Af Amer: >60 mL/min (ref >60)
Glucose, Bld: 88 mg/dL (ref 65–99)
Potassium: 3.9 mmol/L (ref 3.5–5.1)
Sodium: 136 mmol/L (ref 135–145)

## 2016-10-10 LAB — CBC
HEMATOCRIT: 38.7 % (ref 36.0–46.0)
HEMOGLOBIN: 13.1 g/dL (ref 12.0–15.0)
MCH: 32.1 pg (ref 26.0–34.0)
MCHC: 33.9 g/dL (ref 30.0–36.0)
MCV: 94.9 fL (ref 78.0–100.0)
Platelets: 250 10*3/uL (ref 150–400)
RBC: 4.08 MIL/uL (ref 3.87–5.11)
RDW: 12.1 % (ref 11.5–15.5)
WBC: 7.5 10*3/uL (ref 4.0–10.5)

## 2016-10-10 LAB — ABO/RH: ABO/RH(D): A NEG

## 2016-10-10 LAB — SURGICAL PCR SCREEN
MRSA, PCR: NEGATIVE
Staphylococcus aureus: NEGATIVE

## 2016-10-10 NOTE — Pre-Procedure Instructions (Signed)
EKG 6'17 Epic. Clearance note with chart (Dr. Leotis ShamesJeffery) 09-20-16.

## 2016-10-10 NOTE — Patient Instructions (Addendum)
Rita Berry  10/10/2016   Your procedure is scheduled on: 10-18-16  [facility]  [facility] 3[facility] floor to  Short Stay Center at 1000 AM.  Call this number if you have problems the morning of surgery [phone]   Remember: ONLY 1 PERSON MAY GO WITH YOU TO SHORT STAY TO GET  READY MORNING OF YOUR SURGERY.  Do not eat food or drink liquids :After Midnight.     Take these medicines the morning of surgery with A SIP OF WATER:  None.  DO NOT TAKE ANY DIABETIC MEDICATIONS DAY OF YOUR SURGERY                               You may not have any metal on your body including hair pins and              piercings  Do not wear jewelry, make-up, lotions, powders or perfumes, deodorant             Do not wear nail polish.  Do not shave  48 hours prior to surgery.              Men may shave face and neck.   Do not bring valuables to the hospital. [facility] IS NOT             RESPONSIBLE   FOR VALUABLES.  Contacts, dentures or bridgework may not be worn into surgery.  Leave suitcase in the car. After surgery it may be brought to your room.     Patients discharged the day of surgery will not be allowed to drive home.  Name and phone number of your driver: Rita Berry-spouse [phone]- [phone] cell  Special Instructions: N/A              Please read over the following fact sheets you were given: _____________________________________________________________________             [facility] - Preparing for Surgery Before surgery, you can play an important role.  Because skin is not sterile, your skin needs to be as free of germs as possible.  You can reduce the number of germs on your skin by washing with CHG (chlorahexidine gluconate) soap before surgery.  CHG is an antiseptic cleaner which kills germs and bonds with the skin to continue killing germs even after washing. Please DO NOT use if you have an allergy to CHG or antibacterial  soaps.  If your skin becomes reddened/irritated stop using the CHG and inform your nurse when you arrive at Short Stay. Do not shave (including legs and underarms) for at least 48 hours prior to the first CHG shower.  You may shave your face/neck. Please follow these instructions carefully:  1.  Shower with CHG Soap the night before surgery and the  morning of Surgery.  2.  If you choose to wash your hair, wash your hair first as usual with your  normal  shampoo.  3.  After you shampoo, rinse your hair and body thoroughly to remove the  shampoo.                           4.  Use CHG as you would any other liquid soap.  You can apply chg directly  to the skin and wash                       Gently with a scrungie or clean washcloth.  5.  Apply the CHG Soap to your body ONLY FROM THE NECK DOWN.   Do not use on face/ open                           Wound or open sores. Avoid contact with eyes, ears mouth and genitals (private parts).                       Wash face,  Genitals (private parts) with your normal soap.             6.  Wash thoroughly, paying special attention to the area where your surgery  will be performed.  7.  Thoroughly rinse your body with warm water from the neck down.  8.  DO NOT shower/wash with your normal soap after using and rinsing off  the CHG Soap.                9.  Pat yourself dry with a clean towel.            10.  Wear clean pajamas.            11.  Place clean sheets on your bed the night of your first shower and do not  sleep with pets. Day of Surgery : Do not apply any lotions/deodorants the morning of surgery.  Please wear clean clothes to the hospital/surgery center.  FAILURE TO FOLLOW THESE INSTRUCTIONS MAY RESULT IN THE CANCELLATION OF YOUR SURGERY PATIENT SIGNATURE_________________________________  NURSE SIGNATURE__________________________________  ________________________________________________________________________   Rita Berry  An  incentive spirometer is a tool that can help keep your lungs clear and active. This tool measures how well you are filling your lungs with each breath. Taking long deep breaths may help reverse or decrease the chance of developing breathing (pulmonary) problems (especially infection) following:  A long period of time when you are unable to move or be active. BEFORE THE PROCEDURE   If the spirometer includes an indicator to show your best effort, your nurse or respiratory therapist will set it to a desired goal.  If possible, sit up straight or lean slightly forward. Try not to slouch.  Hold the incentive spirometer in an upright position. INSTRUCTIONS FOR USE  1. Sit on the edge of your bed if possible, or sit up as far as you can in bed or on a chair. 2. Hold the incentive spirometer in an upright position. 3. Breathe out normally. 4. Place the mouthpiece in your mouth and seal your lips tightly around it. 5. Breathe in slowly and as deeply as possible, raising the piston or the ball toward the top of the column. 6. Hold your breath for 3-5 seconds or for as long as possible. Allow the piston or ball to fall to the bottom of the column. 7. Remove the mouthpiece from your mouth and breathe out normally. 8. Rest for a few seconds and repeat Steps 1 through 7 at least 10 times every 1-2 hours when you are awake. Take your time and take a few normal breaths between deep breaths. 9. The spirometer may include an indicator to show your best effort. Use the indicator as a goal to work toward during each repetition. 10. After each  set of 10 deep breaths, practice coughing to be sure your lungs are clear. If you have an incision (the cut made at the time of surgery), support your incision when coughing by placing a pillow or rolled up towels firmly against it. Once you are able to get out of bed, walk around indoors and cough well. You may stop using the incentive spirometer when instructed by your  caregiver.  RISKS AND COMPLICATIONS  Take your time so you do not get dizzy or light-headed.  If you are in pain, you may need to take or ask for pain medication before doing incentive spirometry. It is harder to take a deep breath if you are having pain. AFTER USE  Rest and breathe slowly and easily.  It can be helpful to keep track of a log of your progress. Your caregiver can provide you with a simple table to help with this. If you are using the spirometer at home, follow these instructions: [place] IF:   You are having difficultly using the spirometer.  You have trouble using the spirometer as often as instructed.  Your pain medication is not giving enough relief while using the spirometer.  You develop fever of 100.5 F (38.1 C) or higher. SEEK IMMEDIATE MEDICAL CARE IF:   You cough up bloody sputum that had not been present before.  You develop fever of 102 F (38.9 C) or greater.  You develop worsening pain at or near the incision site. MAKE SURE YOU:   Understand these instructions.  Will watch your condition.  Will get help right away if you are not doing well or get worse. Document Released: 04/17/2007 Document Revised: 02/27/2012 Document Reviewed: 06/18/2007 Exit[employer] Patient Information 2014 Exit[employer], [place].   ________________________________________________________________________  WHAT IS A BLOOD TRANSFUSION? Blood Transfusion Information  A transfusion is the replacement of blood or some of its parts. Blood is made up of multiple cells which provide different functions.  Red blood cells carry oxygen and are used for blood loss replacement.  White blood cells fight against infection.  Platelets control bleeding.  Plasma helps clot blood.  Other blood products are available for specialized needs, such as hemophilia or other clotting disorders. BEFORE THE TRANSFUSION  Who gives blood for transfusions?   Healthy volunteers who are fully  evaluated to make sure their blood is safe. This is blood bank blood. Transfusion therapy is the safest it has ever been in the practice of medicine. Before blood is taken from a donor, a complete history is taken to make sure that person has no history of diseases nor engages in risky social behavior (examples are intravenous drug use or sexual activity with multiple partners). The donor's travel history is screened to minimize risk of transmitting infections, such as malaria. The donated blood is tested for signs of infectious diseases, such as HIV and hepatitis. The blood is then tested to be sure it is compatible with you in order to minimize the chance of a transfusion reaction. If you or a relative donates blood, this is often done in anticipation of surgery and is not appropriate for emergency situations. It takes many days to process the donated blood. RISKS AND COMPLICATIONS Although transfusion therapy is very safe and saves many lives, the main dangers of transfusion include:   Getting an infectious disease.  Developing a transfusion reaction. This is an allergic reaction to something in the blood you were given. Every precaution is taken to prevent this. The decision to have  a blood transfusion has been considered carefully by your caregiver before blood is given. Blood is not given unless the benefits outweigh the risks. AFTER THE TRANSFUSION  Right after receiving a blood transfusion, you will usually feel much better and more energetic. This is especially true if your red blood cells have gotten low (anemic). The transfusion raises the level of the red blood cells which carry oxygen, and this usually causes an energy increase.  The nurse administering the transfusion will monitor you carefully for complications. HOME CARE INSTRUCTIONS  No special instructions are needed after a transfusion. You may find your energy is better. Speak with your caregiver about any limitations on activity  for underlying diseases you may have. SEEK MEDICAL CARE IF:   Your condition is not improving after your transfusion.  You develop redness or irritation at the intravenous (IV) site. SEEK IMMEDIATE MEDICAL CARE IF:  Any of the following symptoms occur over the next 12 hours:  Shaking chills.  You have a temperature by mouth above 102 F (38.9 C), not controlled by medicine.  Chest, back, or muscle pain.  People around you feel you are not acting correctly or are confused.  Shortness of breath or difficulty breathing.  Dizziness and fainting.  You get a rash or develop hives.  You have a decrease in urine output.  Your urine turns a dark color or changes to pink, red, or brown. Any of the following symptoms occur over the next 10 days:  You have a temperature by mouth above 102 F (38.9 C), not controlled by medicine.  Shortness of breath.  Weakness after normal activity.  The white part of the eye turns yellow (jaundice).  You have a decrease in the amount of urine or are urinating less often.  Your urine turns a dark color or changes to pink, red, or brown. Document Released: 12/02/2000 Document Revised: 02/27/2012 Document Reviewed: 07/21/2008 [facility] Patient Information 2014 [place], [place].  _______________________________________________________________________

## 2016-10-17 NOTE — Progress Notes (Signed)
Called patient and left message on VM that time change for surgery by Dr. Charlann Boxerlin and to report to [facility] at 705 am . For any questions ,please call [phone] back by 7pm tonight.

## 2016-10-18 ENCOUNTER — Inpatient Hospital Stay (HOSPITAL_COMMUNITY): Payer: PRIVATE HEALTH INSURANCE | Admitting: Anesthesiology

## 2016-10-18 ENCOUNTER — Encounter (HOSPITAL_COMMUNITY): Payer: Self-pay

## 2016-10-18 ENCOUNTER — Inpatient Hospital Stay (HOSPITAL_COMMUNITY): Payer: PRIVATE HEALTH INSURANCE

## 2016-10-18 ENCOUNTER — Inpatient Hospital Stay (HOSPITAL_COMMUNITY)
Admission: RE | Admit: 2016-10-18 | Discharge: 2016-10-19 | DRG: 470 | Disposition: A | Discharge status: Home Health | Payer: PRIVATE HEALTH INSURANCE | Source: Ambulatory Visit | Attending: Orthopedic Surgery | Admitting: Orthopedic Surgery

## 2016-10-18 ENCOUNTER — Encounter (HOSPITAL_COMMUNITY)
Admission: RE | Disposition: A | Discharge status: Home Health | Payer: Self-pay | Source: Ambulatory Visit | Attending: Orthopedic Surgery

## 2016-10-18 DIAGNOSIS — Z87410 Personal history of cervical dysplasia: Secondary | ICD-10-CM | POA: Diagnosis not present

## 2016-10-18 DIAGNOSIS — Z885 Allergy status to narcotic agent status: Secondary | ICD-10-CM

## 2016-10-18 DIAGNOSIS — Z8249 Family history of ischemic heart disease and other diseases of the circulatory system: Secondary | ICD-10-CM | POA: Diagnosis not present

## 2016-10-18 DIAGNOSIS — I10 Essential (primary) hypertension: Secondary | ICD-10-CM | POA: Diagnosis present

## 2016-10-18 DIAGNOSIS — Z87891 Personal history of nicotine dependence: Secondary | ICD-10-CM | POA: Diagnosis not present

## 2016-10-18 DIAGNOSIS — Z91040 Latex allergy status: Secondary | ICD-10-CM | POA: Diagnosis not present

## 2016-10-18 DIAGNOSIS — M1611 Unilateral primary osteoarthritis, right hip: Principal | ICD-10-CM | POA: Diagnosis present

## 2016-10-18 DIAGNOSIS — Z96649 Presence of unspecified artificial hip joint: Secondary | ICD-10-CM

## 2016-10-18 HISTORY — PX: TOTAL HIP ARTHROPLASTY: SHX124

## 2016-10-18 LAB — TYPE AND SCREEN
ABO/RH(D): A NEG
Antibody Screen: NEGATIVE

## 2016-10-18 SURGERY — ARTHROPLASTY, HIP, TOTAL, ANTERIOR APPROACH
Anesthesia: Spinal | Site: Hip | Laterality: Right

## 2016-10-18 MED ORDER — SODIUM CHLORIDE 0.9 % IV SOLN
100.0000 mL/h | INTRAVENOUS | Status: DC
  Administered 2016-10-18: 100 mL/h via INTRAVENOUS
  Filled 2016-10-18 (×4): qty 1000

## 2016-10-18 MED ORDER — DEXAMETHASONE SODIUM PHOSPHATE 10 MG/ML IJ SOLN
INTRAMUSCULAR | Status: AC
  Filled 2016-10-18: qty 1

## 2016-10-18 MED ORDER — BUPIVACAINE IN DEXTROSE 0.75-8.25 % IT SOLN
INTRATHECAL | Status: DC | PRN
  Administered 2016-10-18: 2 mL via INTRATHECAL

## 2016-10-18 MED ORDER — PROPOFOL 500 MG/50ML IV EMUL
INTRAVENOUS | Status: DC | PRN
  Administered 2016-10-18: 100 ug/kg/min via INTRAVENOUS

## 2016-10-18 MED ORDER — ONDANSETRON HCL 4 MG/2ML IJ SOLN: 4.0000 mg | Freq: Four times a day (QID) | INTRAMUSCULAR | Status: DC | PRN

## 2016-10-18 MED ORDER — CEFAZOLIN SODIUM-DEXTROSE 2-4 GM/100ML-% IV SOLN
2.0000 g | Freq: Four times a day (QID) | INTRAVENOUS | Status: AC
  Administered 2016-10-18 (×2): 2 g via INTRAVENOUS
  Filled 2016-10-18 (×2): qty 100

## 2016-10-18 MED ORDER — FENTANYL CITRATE (PF) 100 MCG/2ML IJ SOLN
INTRAMUSCULAR | Status: DC | PRN
  Administered 2016-10-18: 50 ug via INTRAVENOUS
  Administered 2016-10-18 (×2): 25 ug via INTRAVENOUS

## 2016-10-18 MED ORDER — DEXAMETHASONE SODIUM PHOSPHATE 10 MG/ML IJ SOLN
10.0000 mg | Freq: Once | INTRAMUSCULAR | Status: AC
  Administered 2016-10-18: 10 mg via INTRAVENOUS

## 2016-10-18 MED ORDER — NITROGLYCERIN 0.1 MG/HR TD PT24
0.1000 mg | MEDICATED_PATCH | Freq: Every day | TRANSDERMAL | Status: DC
  Administered 2016-10-18: 0.1 mg via TRANSDERMAL
  Filled 2016-10-18 (×3): qty 1

## 2016-10-18 MED ORDER — PROMETHAZINE HCL 25 MG/ML IJ SOLN: 6.2500 mg | INTRAMUSCULAR | Status: DC | PRN

## 2016-10-18 MED ORDER — MIDAZOLAM HCL 2 MG/2ML IJ SOLN
INTRAMUSCULAR | Status: AC
  Filled 2016-10-18: qty 2

## 2016-10-18 MED ORDER — CEFAZOLIN SODIUM-DEXTROSE 2-4 GM/100ML-% IV SOLN
2.0000 g | INTRAVENOUS | Status: AC
  Administered 2016-10-18: 2 g via INTRAVENOUS
  Filled 2016-10-18: qty 100

## 2016-10-18 MED ORDER — HYDROMORPHONE HCL 1 MG/ML IJ SOLN: 0.2500 mg | INTRAMUSCULAR | Status: DC | PRN

## 2016-10-18 MED ORDER — METOCLOPRAMIDE HCL 5 MG/ML IJ SOLN
5.0000 mg | Freq: Three times a day (TID) | INTRAMUSCULAR | Status: DC | PRN
Start: 2016-10-18 — End: 2016-10-19

## 2016-10-18 MED ORDER — FENTANYL CITRATE (PF) 100 MCG/2ML IJ SOLN
INTRAMUSCULAR | Status: AC
  Filled 2016-10-18: qty 2

## 2016-10-18 MED ORDER — CELECOXIB 200 MG PO CAPS
200.0000 mg | ORAL_CAPSULE | Freq: Two times a day (BID) | ORAL | Status: DC
  Administered 2016-10-18 – 2016-10-19 (×2): 200 mg via ORAL
  Filled 2016-10-18 (×2): qty 1

## 2016-10-18 MED ORDER — ONDANSETRON HCL 4 MG/2ML IJ SOLN
INTRAMUSCULAR | Status: AC
  Filled 2016-10-18: qty 2

## 2016-10-18 MED ORDER — PROPOFOL 10 MG/ML IV BOLUS
INTRAVENOUS | Status: AC
  Filled 2016-10-18: qty 80

## 2016-10-18 MED ORDER — TRAMADOL HCL 50 MG PO TABS
50.0000 mg | ORAL_TABLET | Freq: Four times a day (QID) | ORAL | Status: DC
  Administered 2016-10-18 – 2016-10-19 (×3): 50 mg via ORAL
  Administered 2016-10-19 (×2): 100 mg via ORAL
  Filled 2016-10-18 (×2): qty 1
  Filled 2016-10-18 (×3): qty 2

## 2016-10-18 MED ORDER — HYDROMORPHONE HCL 1 MG/ML IJ SOLN: 0.5000 mg | INTRAMUSCULAR | Status: DC | PRN

## 2016-10-18 MED ORDER — ONDANSETRON HCL 4 MG PO TABS: 4.0000 mg | ORAL_TABLET | Freq: Four times a day (QID) | ORAL | Status: DC | PRN

## 2016-10-18 MED ORDER — CEFAZOLIN SODIUM-DEXTROSE 2-4 GM/100ML-% IV SOLN
INTRAVENOUS | Status: AC
  Filled 2016-10-18: qty 100

## 2016-10-18 MED ORDER — LACTATED RINGERS IV SOLN: INTRAVENOUS | Status: DC

## 2016-10-18 MED ORDER — PHENYLEPHRINE 40 MCG/ML (10ML) SYRINGE FOR IV PUSH (FOR BLOOD PRESSURE SUPPORT)
PREFILLED_SYRINGE | INTRAVENOUS | Status: AC
  Filled 2016-10-18: qty 10

## 2016-10-18 MED ORDER — PHENOL 1.4 % MT LIQD
1.0000 | OROMUCOSAL | Status: DC | PRN
  Filled 2016-10-18: qty 177

## 2016-10-18 MED ORDER — BISACODYL 10 MG RE SUPP: 10.0000 mg | Freq: Every day | RECTAL | Status: DC | PRN

## 2016-10-18 MED ORDER — MIDAZOLAM HCL 5 MG/5ML IJ SOLN
INTRAMUSCULAR | Status: DC | PRN
  Administered 2016-10-18: 2 mg via INTRAVENOUS

## 2016-10-18 MED ORDER — TRANEXAMIC ACID 1000 MG/10ML IV SOLN
1000.0000 mg | INTRAVENOUS | Status: AC
  Administered 2016-10-18: 1000 mg via INTRAVENOUS
  Filled 2016-10-18: qty 1100

## 2016-10-18 MED ORDER — STERILE WATER FOR IRRIGATION IR SOLN
Status: DC | PRN
  Administered 2016-10-18: 3000 mL

## 2016-10-18 MED ORDER — SODIUM CHLORIDE 0.9 % IR SOLN
Status: DC | PRN
  Administered 2016-10-18: 1000 mL

## 2016-10-18 MED ORDER — ALUM & MAG HYDROXIDE-SIMETH 200-200-20 MG/5ML PO SUSP: 30.0000 mL | ORAL | Status: DC | PRN

## 2016-10-18 MED ORDER — ASPIRIN 81 MG PO CHEW
81.0000 mg | CHEWABLE_TABLET | Freq: Two times a day (BID) | ORAL | Status: DC
  Administered 2016-10-18 – 2016-10-19 (×2): 81 mg via ORAL
  Filled 2016-10-18 (×2): qty 1

## 2016-10-18 MED ORDER — ONDANSETRON HCL 4 MG/2ML IJ SOLN
INTRAMUSCULAR | Status: DC | PRN
  Administered 2016-10-18: 4 mg via INTRAVENOUS

## 2016-10-18 MED ORDER — MAGNESIUM CITRATE PO SOLN: 1.0000 | Freq: Once | ORAL | Status: DC | PRN

## 2016-10-18 MED ORDER — DOCUSATE SODIUM 100 MG PO CAPS
100.0000 mg | ORAL_CAPSULE | Freq: Two times a day (BID) | ORAL | Status: DC
  Administered 2016-10-18 – 2016-10-19 (×2): 100 mg via ORAL
  Filled 2016-10-18 (×2): qty 1

## 2016-10-18 MED ORDER — PHENYLEPHRINE HCL 10 MG/ML IJ SOLN
INTRAMUSCULAR | Status: AC
  Filled 2016-10-18: qty 1

## 2016-10-18 MED ORDER — METOCLOPRAMIDE HCL 5 MG PO TABS: 5.0000 mg | ORAL_TABLET | Freq: Three times a day (TID) | ORAL | Status: DC | PRN

## 2016-10-18 MED ORDER — DEXAMETHASONE SODIUM PHOSPHATE 10 MG/ML IJ SOLN
10.0000 mg | Freq: Once | INTRAMUSCULAR | Status: AC
  Administered 2016-10-19: 10 mg via INTRAVENOUS
  Filled 2016-10-18: qty 1

## 2016-10-18 MED ORDER — POLYETHYLENE GLYCOL 3350 17 G PO PACK
17.0000 g | PACK | Freq: Two times a day (BID) | ORAL | Status: DC
  Administered 2016-10-18 – 2016-10-19 (×2): 17 g via ORAL
  Filled 2016-10-18 (×2): qty 1

## 2016-10-18 MED ORDER — MENTHOL 3 MG MT LOZG: 1.0000 | LOZENGE | OROMUCOSAL | Status: DC | PRN

## 2016-10-18 MED ORDER — METHOCARBAMOL 500 MG PO TABS: 500.0000 mg | ORAL_TABLET | Freq: Four times a day (QID) | ORAL | Status: DC | PRN

## 2016-10-18 MED ORDER — PROPOFOL 10 MG/ML IV BOLUS
INTRAVENOUS | Status: DC | PRN
  Administered 2016-10-18: 40 mg via INTRAVENOUS

## 2016-10-18 MED ORDER — ACETAMINOPHEN 500 MG PO TABS
1000.0000 mg | ORAL_TABLET | Freq: Three times a day (TID) | ORAL | Status: DC
  Administered 2016-10-18 – 2016-10-19 (×4): 1000 mg via ORAL
  Filled 2016-10-18 (×4): qty 2

## 2016-10-18 MED ORDER — METHOCARBAMOL 1000 MG/10ML IJ SOLN
500.0000 mg | Freq: Four times a day (QID) | INTRAVENOUS | Status: DC | PRN
  Filled 2016-10-18: qty 5

## 2016-10-18 MED ORDER — LACTATED RINGERS IV SOLN
INTRAVENOUS | Status: DC
  Administered 2016-10-18: 1000 mL via INTRAVENOUS

## 2016-10-18 MED ORDER — CHLORHEXIDINE GLUCONATE 4 % EX LIQD: 60.0000 mL | Freq: Once | CUTANEOUS | Status: DC

## 2016-10-18 MED ORDER — FERROUS SULFATE 325 (65 FE) MG PO TABS
325.0000 mg | ORAL_TABLET | Freq: Three times a day (TID) | ORAL | Status: DC
  Administered 2016-10-18 – 2016-10-19 (×2): 325 mg via ORAL
  Filled 2016-10-18 (×2): qty 1

## 2016-10-18 MED ORDER — LACTATED RINGERS IV SOLN
INTRAVENOUS | Status: DC | PRN
  Administered 2016-10-18 (×2): via INTRAVENOUS

## 2016-10-18 MED ORDER — PHENYLEPHRINE 40 MCG/ML (10ML) SYRINGE FOR IV PUSH (FOR BLOOD PRESSURE SUPPORT)
PREFILLED_SYRINGE | INTRAVENOUS | Status: DC | PRN
  Administered 2016-10-18: 80 ug via INTRAVENOUS
  Administered 2016-10-18: 40 ug via INTRAVENOUS

## 2016-10-18 SURGICAL SUPPLY — 38 items
ADH SKN CLS APL DERMABOND .7 (GAUZE/BANDAGES/DRESSINGS) ×1
BAG DECANTER FOR FLEXI CONT (MISCELLANEOUS) IMPLANT
BAG SPEC THK2 15X12 ZIP CLS (MISCELLANEOUS)
BAG ZIPLOCK 12X15 (MISCELLANEOUS) IMPLANT
CAPT HIP TOTAL 2 ×1 IMPLANT
CLOTH BEACON ORANGE TIMEOUT ST (SAFETY) ×2 IMPLANT
COVER PERINEAL POST (MISCELLANEOUS) ×2 IMPLANT
DERMABOND ADVANCED (GAUZE/BANDAGES/DRESSINGS) ×1
DERMABOND ADVANCED .7 DNX12 (GAUZE/BANDAGES/DRESSINGS) ×1 IMPLANT
DRAPE STERI IOBAN 125X83 (DRAPES) ×2 IMPLANT
DRAPE U-SHAPE 47X51 STRL (DRAPES) ×4 IMPLANT
DRESSING AQUACEL AG SP 3.5X10 (GAUZE/BANDAGES/DRESSINGS) ×1 IMPLANT
DRSG AQUACEL AG ADV 3.5X10 (GAUZE/BANDAGES/DRESSINGS) ×1 IMPLANT
DRSG AQUACEL AG SP 3.5X10 (GAUZE/BANDAGES/DRESSINGS) ×2
DURAPREP 26ML APPLICATOR (WOUND CARE) ×2 IMPLANT
ELECT REM PT RETURN 15FT ADLT (MISCELLANEOUS) IMPLANT
ELECT REM PT RETURN 9FT ADLT (ELECTROSURGICAL) ×2
ELECTRODE REM PT RTRN 9FT ADLT (ELECTROSURGICAL) ×1 IMPLANT
GLOVE BIOGEL M STRL SZ7.5 (GLOVE) IMPLANT
GLOVE BIOGEL PI IND STRL 7.5 (GLOVE) ×1 IMPLANT
GLOVE BIOGEL PI IND STRL 8.5 (GLOVE) ×1 IMPLANT
GLOVE BIOGEL PI INDICATOR 7.5 (GLOVE) ×8
GLOVE BIOGEL PI INDICATOR 8.5 (GLOVE)
GLOVE ECLIPSE 8.0 STRL XLNG CF (GLOVE) ×2 IMPLANT
GLOVE ORTHO TXT STRL SZ7.5 (GLOVE) IMPLANT
GOWN STRL REUS W/TWL LRG LVL3 (GOWN DISPOSABLE) ×4 IMPLANT
GOWN STRL REUS W/TWL XL LVL3 (GOWN DISPOSABLE) ×2 IMPLANT
HOLDER FOLEY CATH W/STRAP (MISCELLANEOUS) ×2 IMPLANT
PACK ANTERIOR HIP CUSTOM (KITS) ×2 IMPLANT
SAW OSC TIP CART 19.5X105X1.3 (SAW) ×2 IMPLANT
SUT MNCRL AB 4-0 PS2 18 (SUTURE) ×2 IMPLANT
SUT VIC AB 1 CT1 36 (SUTURE) ×6 IMPLANT
SUT VIC AB 2-0 CT1 27 (SUTURE) ×4
SUT VIC AB 2-0 CT1 TAPERPNT 27 (SUTURE) ×2 IMPLANT
SUT VLOC 180 0 24IN GS25 (SUTURE) ×2 IMPLANT
TRAY FOLEY BAG SILVER LF 16FR (CATHETERS) ×1 IMPLANT
WATER STERILE IRR 1500ML POUR (IV SOLUTION) ×3 IMPLANT
YANKAUER SUCT BULB TIP 10FT TU (MISCELLANEOUS) IMPLANT

## 2016-10-18 NOTE — Op Note (Signed)
NAME:  Rita Berry                ACCOUNT NO.: [id number]      MEDICAL RECORD NO.: [id number]      FACILITY[facility]      PHYSICIAN:  Durene RomansLIN,Naraly Fritcher D  DATE OF BIRTH:  1956-04-04     DATE OF PROCEDURE:  10/18/2016                                 OPERATIVE REPORT         PREOPERATIVE DIAGNOSIS: Right  hip osteoarthritis.      POSTOPERATIVE DIAGNOSIS:  Right hip osteoarthritis.      PROCEDURE:  Right total hip replacement through an anterior approach   utilizing DePuy THR system, component size 52mm pinnacle cup, a size 36+4 neutral   Altrex liner, a size 5 Hi Tri Lock stem with a 36+1.5 delta ceramic   ball.      SURGEON:  Madlyn FrankelMatthew D. Charlann Boxerlin, M.D.      ASSISTANT:  Leilani AbleSteve Chabon, PA-C     ANESTHESIA:  Spinal.      SPECIMENS:  None.      COMPLICATIONS:  None.      BLOOD LOSS:  250 cc     DRAINS:  None.      INDICATION OF THE PROCEDURE:  Rita Berry is a 60 y.o. female who had   presented to office for evaluation of right hip pain.  Radiographs revealed   progressive degenerative changes with bone-on-bone   articulation to the  hip joint.  The patient had painful limited range of   motion significantly affecting their overall quality of life.  The patient was failing to    respond to conservative measures, and at this point was ready   to proceed with more definitive measures.  The patient has noted progressive   degenerative changes in his hip, progressive problems and dysfunction   with regarding the hip prior to surgery.  Consent was obtained for   benefit of pain relief.  Specific risk of infection, DVT, component   failure, dislocation, need for revision surgery, as well discussion of   the anterior versus posterior approach were reviewed.  Consent was   obtained for benefit of anterior pain relief through an anterior   approach.      PROCEDURE IN DETAIL:  The patient was brought to operative theater.   Once adequate anesthesia, preoperative  antibiotics, 2gm of Ancef, 1 gm of Tranexamic Acid, and 10 mg of Decadron administered.   The patient was positioned supine on the OSI Hanna table.  Once adequate   padding of boney process was carried out, we had predraped out the hip, and  used fluoroscopy to confirm orientation of the pelvis and position.      The right hip was then prepped and draped from proximal iliac crest to   mid thigh with shower curtain technique.      Time-out was performed identifying the patient, planned procedure, and   extremity.     An incision was then made 2 cm distal and lateral to the   anterior superior iliac spine extending over the orientation of the   tensor fascia lata muscle and sharp dissection was carried down to the   fascia of the muscle and protractor placed in the soft tissues.      The fascia was  then incised.  The muscle belly was identified and swept   laterally and retractor placed along the superior neck.  Following   cauterization of the circumflex vessels and removing some pericapsular   fat, a second cobra retractor was placed on the inferior neck.  A third   retractor was placed on the anterior acetabulum after elevating the   anterior rectus.  A L-capsulotomy was along the line of the   superior neck to the trochanteric fossa, then extended proximally and   distally.  Tag sutures were placed and the retractors were then placed   intracapsular.  We then identified the trochanteric fossa and   orientation of my neck cut, confirmed this radiographically   and then made a neck osteotomy with the femur on traction.  The femoral   head was removed without difficulty or complication.  Traction was let   off and retractors were placed posterior and anterior around the   acetabulum.      The labrum and foveal tissue were debrided.  I began reaming with a 46mm   reamer and reamed up to 51mm reamer with good bony bed preparation and a 52mm   cup was chosen.  The final 52mm Pinnacle cup  was then impacted under fluoroscopy  to confirm the depth of penetration and orientation with respect to   abduction.  A screw was placed followed by the hole eliminator.  The final   36+4 neutral Altrex liner was impacted with good visualized rim fit.  The cup was positioned anatomically within the acetabular portion of the pelvis.      At this point, the femur was rolled at 80 degrees.  Further capsule was   released off the inferior aspect of the femoral neck.  I then   released the superior capsule proximally.  The hook was placed laterally   along the femur and elevated manually and held in position with the bed   hook.  The leg was then extended and adducted with the leg rolled to 100   degrees of external rotation.  Once the proximal femur was fully   exposed, I used a box osteotome to set orientation.  I then began   broaching with the starting chili pepper broach and passed this by hand and then broached up to 5.  With the 5 broach in place I chose a high offset neck and did several trial reductions.  The offset was appropriate, leg lengths   appeared to be equal best matched with the +1.5 head ball confirmed radiographically.   Given these findings, I went ahead and dislocated the hip, repositioned all   retractors and positioned the right hip in the extended and abducted position.  The final 5 Hi Tri Lock stem was   chosen and it was impacted down to the level of neck cut.  Based on this   and the trial reduction, a 36+1.5 delta ceramic ball was chosen and   impacted onto a clean and dry trunnion, and the hip was reduced.  The   hip had been irrigated throughout the case again at this point.  I did   reapproximate the superior capsular leaflet to the anterior leaflet   using #1 Vicryl.  The fascia of the   tensor fascia lata muscle was then reapproximated using #1 Vicryl and #0 V-lock sutures.  The   remaining wound was closed with 2-0 Vicryl and running 4-0 Monocryl.   The hip was  cleaned, dried, and  dressed sterilely using Dermabond and   Aquacel dressing.  She was then brought   to recovery room in stable condition tolerating the procedure well.    Leilani Able, PA-C was present for the entirety of the case involved from   preoperative positioning, perioperative retractor management, general   facilitation of the case, as well as primary wound closure as assistant.            Madlyn Frankel Charlann Boxer, M.D.        10/18/2016 10:58 AM

## 2016-10-18 NOTE — Anesthesia Procedure Notes (Signed)
Spinal  Patient location during procedure: OR Start time: 10/18/2016 9:40 AM End time: 10/18/2016 9:49 AM Staffing Resident/CRNA: Akanksha Bellmore Performed: resident/CRNA  Preanesthetic Checklist Completed: patient identified, site marked, surgical consent, pre-op evaluation, timeout performed, IV checked, risks and benefits discussed and monitors and equipment checked Spinal Block Patient position: sitting Prep: Betadine Patient monitoring: heart rate, cardiac monitor, continuous pulse ox and blood pressure Approach: midline Location: L3-4 Injection technique: single-shot Needle Needle type: Spinocan  Needle gauge: 22 G Needle length: 9 cm Needle insertion depth: 6 cm Assessment Sensory level: T6 Additional Notes -heme, -para, VSS.  Lot and expiration checked.   Pt tol well.

## 2016-10-18 NOTE — Evaluation (Signed)
Physical Therapy Evaluation Patient Details Name: Rita Berry MRN: [id number] DOB: 11-Apr-1956 Today's Date: 10/18/2016   History of Present Illness  Rita Berry  Clinical Impression  The patient was anxious and complained of feeling dizzy after standing up. Assisted back to bed. Pt admitted with above diagnosis. Pt currently with functional limitations due to the deficits listed below (see PT Problem List).  Pt will benefit from skilled PT to increase their independence and safety with mobility to allow discharge to the venue listed below.        Follow Up Recommendations Home health PT;Supervision/Assistance - 24 hour    Equipment Recommendations  Rolling walker with 5" wheels    Recommendations for Other Services       Precautions / Restrictions Precautions Precautions: Fall Precaution Comments: dizzy on eval Restrictions Weight Bearing Restrictions: Yes RLE Weight Bearing: Weight bearing as tolerated      Mobility  Bed Mobility Overal bed mobility: Needs Assistance Bed Mobility: Supine to Sit     Supine to sit: Mod assist     General bed mobility comments: assisted patient to sitting with HOB raised. Extra time , stopped several times due to c/o pain. used sheet around the feet to assist legs back into bed with min assist.  Transfers Overall transfer level: Needs assistance Equipment used: Rolling walker (2 wheeled) Transfers: Sit to/from Stand Sit to Stand: Min assist;+2 safety/equipment         General transfer comment: cues for hand placement. patient is impulsive. did not wait for instructions.   Ambulation/Gait Ambulation/Gait assistance: Mod assist;+2 safety/equipment Ambulation Distance (Feet): 5 Feet Assistive device: Rolling walker (2 wheeled) Gait Pattern/deviations: Step-to pattern     General Gait Details: complained of dizziness and had to get back to the bed.   Stairs            Wheelchair Mobility    Modified Rankin (Stroke  Patients Only)       Balance                                             Pertinent Vitals/Pain Pain Assessment: 0-10 Pain Score: 7  Pain Location: R hip Pain Descriptors / Indicators: Discomfort;Grimacing;Guarding Pain Intervention(s): Limited activity within patient's tolerance;Monitored during session;Premedicated before session;Ice applied    Home Living Family/patient expects to be discharged to:: Private residence Living Arrangements: Spouse/significant other Available Help at Discharge: Family Type of Home: House Home Access: Level entry     Home Layout: One level Home Equipment: None      Prior Function                 Hand Dominance        Extremity/Trunk Assessment   Upper Extremity Assessment: Defer to OT evaluation           Lower Extremity Assessment: RLE deficits/detail RLE Deficits / Details: patient in bed with hip flexed to at least 100 degrees.       Communication      Cognition Arousal/Alertness: Awake/alert Behavior During Therapy: Impulsive;Anxious Overall Cognitive Status: Within Functional Limits for tasks assessed                      General Comments      Exercises     Assessment/Plan    PT Assessment Patient needs continued PT services  PT Problem List  Decreased strength;Decreased activity tolerance;Decreased mobility;Cardiopulmonary status limiting activity;Decreased knowledge of precautions;Decreased safety awareness;Decreased knowledge of use of DME          PT Treatment Interventions DME instruction;Functional mobility training;Therapeutic activities;Gait training;Therapeutic exercise;Patient/family education    PT Goals (Current goals can be found in the Care Plan section)  Acute Rehab PT Goals Patient Stated Goal: to walk PT Goal Formulation: With patient Time For Goal Achievement: 10/21/16 Potential to Achieve Goals: Good    Frequency 7X/week   Barriers to discharge         Co-evaluation               End of Session   Activity Tolerance: Treatment limited secondary to medical complications (Comment) Patient left: in bed;with call bell/phone within reach Nurse Communication: Mobility status         Time: [id number] PT Time Calculation (min) (ACUTE ONLY): 16 min   Charges:   PT Evaluation $PT Eval Low Complexity: 1 Procedure     PT G CodesRada Berry:        Rita Berry Rita Berry 10/18/2016, 5:19 PM

## 2016-10-18 NOTE — Anesthesia Postprocedure Evaluation (Signed)
Anesthesia Post Note  Patient: Towanda Octaveeresa B Schetter  Procedure(s) Performed: Procedure(s) (LRB): RIGHT TOTAL HIP ARTHROPLASTY ANTERIOR APPROACH (Right)  Patient location during evaluation: PACU Anesthesia Type: Spinal Level of consciousness: oriented and awake and alert Pain management: pain level controlled Vital Signs Assessment: post-procedure vital signs reviewed and stable Respiratory status: spontaneous breathing, respiratory function stable and patient connected to nasal cannula oxygen Cardiovascular status: blood pressure returned to baseline and stable Postop Assessment: no headache, no backache, spinal receding and patient able to bend at knees Anesthetic complications: no    Last Vitals:  Vitals:   10/18/16 1215 10/18/16 1242  BP: (!) 142/75 (!) 148/76  Pulse: (!) 58 65  Resp: 20 16  Temp: 36.3 C 36.6 C    Last Pain:  Vitals:   10/18/16 1300  TempSrc:   PainSc: 0-No pain                 Kadir Azucena J

## 2016-10-18 NOTE — Care Management Note (Signed)
Case Management Note  Patient Details  Name: Rita Berry MRN: [id number] Date of Birth: March 18, 1956  Subjective/Objective:                  Right total hip replacement through an anterior approach Action/Plan: Discharge planning Expected Discharge Date:  10/19/16              Expected Discharge Plan:  [place]  In-House Referral:     Discharge planning Services  CM Consult  Post Acute Care Choice:  Home Health Choice offered to:  Patient  DME Arranged:  N/A DME Agency:  NA  HH Arranged:  PT [place] Agency:  [facility]  Status of Service:  Completed, signed off  If discussed at [place] of Stay Meetings, dates discussed:    Additional Comments: CM met with pt in room to offer choice of home health agency.  Pt chooses [facility] to render HHPT.  Referral called to [facility] rep, Manuela Schwartz.  Pt states she has both a elevated commode seat with handles and rolling walker at home. No other CM needs were communicated. Dellie Catholic, RN 10/18/2016, 1:37 PM

## 2016-10-18 NOTE — Interval H&P Note (Signed)
History and Physical Interval Note:  10/18/2016 8:28 AM  Rita Berry  has presented today for surgery, with the diagnosis of RIGHT HIP OA  The various methods of treatment have been discussed with the patient and family. After consideration of risks, benefits and other options for treatment, the patient has consented to  Procedure(s): RIGHT TOTAL HIP ARTHROPLASTY ANTERIOR APPROACH (Right) as a surgical intervention .  The patient's history has been reviewed, patient examined, no change in status, stable for surgery.  I have reviewed the patient's chart and labs.  Questions were answered to the patient's satisfaction.     Shelda PalLIN,Dorothye Berni D

## 2016-10-18 NOTE — Transfer of Care (Signed)
Immediate Anesthesia Transfer of Care Note  Patient: Rita Berry  Procedure(s) Performed: Procedure(s): RIGHT TOTAL HIP ARTHROPLASTY ANTERIOR APPROACH (Right)  Patient Location: PACU  Anesthesia Type:Spinal  Level of Consciousness:  sedated, patient cooperative and responds to stimulation  Airway & Oxygen Therapy:Patient Spontanous Breathing and Patient connected to face mask oxgen  Post-op Assessment:  Report given to PACU RN and Post -op Vital signs reviewed and stable  Post vital signs:  Reviewed and stable  Last Vitals:  Vitals:   10/18/16 0713  BP: (!) 148/78  Pulse: 72  Resp: 16  Temp: 36.7 C    Complications: No apparent anesthesia complications

## 2016-10-18 NOTE — Anesthesia Preprocedure Evaluation (Addendum)
Anesthesia Evaluation  Patient identified by MRN, date of birth, ID band Patient awake  General Assessment Comment:Patient Active Problem List  Diagnosis Date Noted . Osteoarthritis of right hip 06/14/2016 . Essential hypertension 06/12/2015 . Diverticulosis 06/12/2015 . Pain in joint, ankle and foot 09/16/2014 . Bilateral bunions 09/16/2014 . Iliotibial band syndrome of left side 10/30/2013 . Greater trochanteric bursitis of both hips 10/30/2013 . Abnormality of gait 06/18/2013 . Eucalcemic Hyperparathyroidism 04/19/2013 . Osteopenia 04/19/2013 . CIN I (cervical intraepithelial neoplasia I)  . Ovarian cyst     Reviewed: Allergy & Precautions, NPO status , Patient's Chart, lab work & pertinent test results  History of Anesthesia Complications (+) PONV and history of anesthetic complications  Airway Mallampati: II  TM Distance: >3 FB Neck ROM: Full    Dental no notable dental hx.    Pulmonary neg pulmonary ROS, former smoker,    Pulmonary exam normal breath sounds clear to auscultation       Cardiovascular hypertension, Normal cardiovascular exam Rhythm:Regular Rate:Normal     Neuro/Psych negative neurological ROS  negative psych ROS   GI/Hepatic negative GI ROS, Neg liver ROS,   Endo/Other  negative endocrine ROS  Renal/GU negative Renal ROS  negative genitourinary   Musculoskeletal  (+) Arthritis ,   Abdominal   Peds negative pediatric ROS (+)  Hematology negative hematology ROS (+)   Anesthesia Other Findings   Reproductive/Obstetrics negative OB ROS                            Anesthesia Physical Anesthesia Plan  ASA: II  Anesthesia Plan: Spinal   Post-op Pain Management:    Induction: Intravenous  Airway Management Planned:   Additional Equipment:   Intra-op Plan:   Post-operative Plan: Extubation in OR  Informed Consent: I have reviewed the patients History  and Physical, chart, labs and discussed the procedure including the risks, benefits and alternatives for the proposed anesthesia with the patient or authorized representative who has indicated his/her understanding and acceptance.   Dental advisory given  Plan Discussed with: CRNA  Anesthesia Plan Comments: (Discussed risks and benefits of and differences between spinal and general. Discussed risks of spinal including headache, backache, failure, bleeding and hematoma, infection, and nerve damage. Patient consents to spinal. Questions answered. Coagulation studies and platelet count acceptable.)       Anesthesia Quick Evaluation

## 2016-10-19 LAB — CBC
HCT: 35.4 % — ABNORMAL LOW (ref 36.0–46.0)
Hemoglobin: 11.9 g/dL — ABNORMAL LOW (ref 12.0–15.0)
MCH: 32.2 pg (ref 26.0–34.0)
MCHC: 33.6 g/dL (ref 30.0–36.0)
MCV: 95.9 fL (ref 78.0–100.0)
PLATELETS: 204 10*3/uL (ref 150–400)
RBC: 3.69 MIL/uL — AB (ref 3.87–5.11)
RDW: 12.4 % (ref 11.5–15.5)
WBC: 8 10*3/uL (ref 4.0–10.5)

## 2016-10-19 LAB — BASIC METABOLIC PANEL
ANION GAP: 2 — AB (ref 5–15)
BUN: 7 mg/dL (ref 6–20)
CALCIUM: 8.4 mg/dL — AB (ref 8.9–10.3)
CO2: 29 mmol/L (ref 22–32)
Chloride: 108 mmol/L (ref 101–111)
Creatinine, Ser: 0.57 mg/dL (ref 0.44–1.00)
GLUCOSE: 105 mg/dL — AB (ref 65–99)
POTASSIUM: 4.2 mmol/L (ref 3.5–5.1)
SODIUM: 139 mmol/L (ref 135–145)

## 2016-10-19 MED ORDER — POLYETHYLENE GLYCOL 3350 17 G PO PACK: 17.0000 g | PACK | Freq: Two times a day (BID) | ORAL | 0 refills | Status: DC

## 2016-10-19 MED ORDER — DOCUSATE SODIUM 100 MG PO CAPS: 100.0000 mg | ORAL_CAPSULE | Freq: Two times a day (BID) | ORAL | 0 refills | Status: DC

## 2016-10-19 MED ORDER — FERROUS SULFATE 325 (65 FE) MG PO TABS: 325.0000 mg | ORAL_TABLET | Freq: Three times a day (TID) | ORAL | 3 refills | Status: DC

## 2016-10-19 MED ORDER — ASPIRIN 81 MG PO CHEW: 81.0000 mg | CHEWABLE_TABLET | Freq: Two times a day (BID) | ORAL | 0 refills | Status: AC

## 2016-10-19 MED ORDER — METHOCARBAMOL 500 MG PO TABS: 500.0000 mg | ORAL_TABLET | Freq: Four times a day (QID) | ORAL | 0 refills | Status: DC | PRN

## 2016-10-19 MED ORDER — TRAMADOL HCL 50 MG PO TABS: 50.0000 mg | ORAL_TABLET | Freq: Four times a day (QID) | ORAL | 0 refills | Status: DC | PRN

## 2016-10-19 NOTE — Evaluation (Signed)
Occupational Therapy Evaluation Patient Details Name: Rita Berry MRN: [id number] DOB: 02/10/1956 Today's Date: 10/19/2016    History of Present Illness Rita Berry   Clinical Impression   This 60 year old female was admitted for the above sx. She experienced and was limited by orthostatic hypotension and pain during evaluation. She will benefit from continued OT in acute setting with min guard to min A level goals.  Pt was independent prior to admission    Follow Up Recommendations  Supervision/Assistance - 24 hour    Equipment Recommendations  None recommended by OT    Recommendations for Other Services       Precautions / Restrictions Precautions Precaution Comments: dizzy EOB   BP 89/52 Restrictions Weight Bearing Restrictions: No RLE Weight Bearing: Weight bearing as tolerated      Mobility Bed Mobility         Supine to sit: Min assist     General bed mobility comments: use of leg lifter; min support for RLE; use of rails and HOB raised  Transfers                 General transfer comment: not attempted due to decreased BP    Balance                                            ADL Overall ADL's : Needs assistance/impaired                                       General ADL Comments: Pt orthostatic upon sitting and dizziness did not resolve.  Returned to supine.  Educated on leg lifter (which she used) and Sports administratorreacher for adls.  She had increased pain with movement. Able to complete UB adls from bed level with set up.  Needs max A for LB from bed level due to orthostatic hypotension.     Vision     Perception     Praxis      Pertinent Vitals/Pain Pain Score: 9  (with movement; 2 at rest) Pain Location: R hip Pain Descriptors / Indicators: Aching;Sharp Pain Intervention(s): Limited activity within patient's tolerance;Monitored during session;Premedicated before session;Repositioned;Ice applied     Hand  Dominance     Extremity/Trunk Assessment Upper Extremity Assessment Upper Extremity Assessment: Overall WFL for tasks assessed           Communication Communication Communication: No difficulties   Cognition Arousal/Alertness: Awake/alert Behavior During Therapy: WFL for tasks assessed/performed Overall Cognitive Status: Within Functional Limits for tasks assessed                     General Comments       Exercises       Shoulder Instructions      Home Living Family/patient expects to be discharged to:: Private residence Living Arrangements: Spouse/significant other Available Help at Discharge: Family               Bathroom Shower/Tub: Walk-in shower   Bathroom Toilet: Handicapped height     Home Equipment: Environmental consultantWalker - 2 wheels;Shower seat - built in;Bedside commode   Additional Comments: borrowed 3:1; she works at [employer]      Prior Functioning/Environment Level of Independence: Independent  OT Problem List: Decreased strength;Decreased activity tolerance;Cardiopulmonary status limiting activity;Decreased knowledge of use of DME or AE;Pain   OT Treatment/Interventions: Self-care/ADL training;DME and/or AE instruction;Patient/family education    OT Goals(Current goals can be found in the care plan section) Acute Rehab OT Goals Patient Stated Goal: be able to do more; less pain OT Goal Formulation: With patient Time For Goal Achievement: 10/26/16 Potential to Achieve Goals: Good ADL Goals Pt Will Perform Lower Body Bathing: with min assist;with adaptive equipment;sit to/from stand Pt Will Perform Lower Body Dressing: with min assist;with adaptive equipment;sit to/from stand Optometrist(pants/reacher) Pt Will Transfer to Toilet: with min assist;ambulating;stand pivot transfer;bedside commode Pt Will Perform Tub/Shower Transfer: Shower transfer;ambulating;with min guard assist;3 in 1 (vs verbalize sequence) Additional ADL Goal #1: pt will  perform bed mobility with min guard assistance with AE/adapted method as needed  OT Frequency: Min 2X/week   Barriers to D/C:            Co-evaluation              End of Session Nurse Communication: Other (comment) (BP)  Activity Tolerance: Treatment limited secondary to medical complications (Comment) (BP) Patient left: in bed;with call bell/phone within reach;with nursing/sitter in room   Time: [id number] OT Time Calculation (min): 29 min Charges:  OT General Charges $OT Visit: 1 Procedure OT Evaluation $OT Eval Low Complexity: 1 Procedure OT Treatments $Self Care/Home Management : 8-22 mins G-Codes:    [name] 10/19/2016, 10:09 AM [name], OTR/L [phone] 10/19/2016

## 2016-10-19 NOTE — Progress Notes (Signed)
Physical Therapy Treatment Patient Details Name: Cecilie Lowerseresa B Rahl MRN: [id number] DOB: 23-Nov-1956 Today's Date: 10/19/2016    History of Present Illness [name]    PT Comments    Patient tolerated ambulation after given fluids. Will ambulate again in PM.  Follow Up Recommendations   (MAY NOT HAVE HH)     Equipment Recommendations  Rolling walker with 5" wheels    Recommendations for Other Services       Precautions / Restrictions Precautions Precautions: Fall Precaution Comments: dizzy EOB   BP 89/52 with OT, Not dizzy after fluids Restrictions RLE Weight Bearing: Weight bearing as tolerated    Mobility  Bed Mobility Overal bed mobility: Needs Assistance Bed Mobility: Supine to Sit     Supine to sit: Min assist     General bed mobility comments: used the sheet around the legs, extra time to move the legs herself.   Transfers Overall transfer level: Needs assistance Equipment used: Rolling walker (2 wheeled) Transfers: Sit to/from Stand Sit to Stand: Min assist;+2 safety/equipment         General transfer comment: not dizzy  Ambulation/Gait   Ambulation Distance (Feet): 500 Feet Assistive device: Rolling walker (2 wheeled) Gait Pattern/deviations: Step-to pattern;Step-through pattern     General Gait Details: gradually improved in sequence AND STEP LENGTH ON THE RIGHT. ENCOURAGED EXTERNAL ROTATION OR THE RIGHT LEG DURING SWING.   Stairs            Wheelchair Mobility    Modified Rankin (Stroke Patients Only)       Balance                                    Cognition Arousal/Alertness: Awake/alert Behavior During Therapy: Anxious Overall Cognitive Status: Within Functional Limits for tasks assessed                      Exercises      General Comments        Pertinent Vitals/Pain Pain Score: 3  Pain Location: right hip Pain Descriptors / Indicators: Discomfort Pain Intervention(s): Limited activity within  patient's tolerance;Monitored during session;Premedicated before session;Repositioned;Ice applied    Home Living Family/patient expects to be discharged to:: Private residence Living Arrangements: Spouse/significant other Available Help at Discharge: Family         Home Equipment: Dan HumphreysWalker - 2 wheels;Shower seat - built in;Bedside commode Additional Comments: borrowed 3:1; she works at [employer]    Prior Function Level of Independence: Independent          PT Goals (current goals can now be found in the care plan section) Acute Rehab PT Goals Patient Stated Goal: be able to do more; less pain Progress towards PT goals: Progressing toward goals    Frequency    7X/week      PT Plan Current plan remains appropriate    Co-evaluation             End of Session   Activity Tolerance: Patient tolerated treatment well Patient left: in chair;with call bell/phone within reach;with family/visitor present     Time: [id number] PT Time Calculation (min) (ACUTE ONLY): 20 min  Charges:                       G Codes:      Rada HayHill, [place] Diana [name] 10/19/2016, 1:26 PM

## 2016-10-19 NOTE — Progress Notes (Signed)
Physical Therapy Treatment Patient Details Name: Cecilie Lowerseresa B Nordahl MRN: [id number] DOB: 12/20/1955 Today's Date: 10/19/2016    History of Present Illness [name]    PT Comments    The patient  Is progressing well. Plans DC today , possibly.  Follow Up Recommendations  Home health PT     Equipment Recommendations  None recommended by PT    Recommendations for Other Services       Precautions / Restrictions Precautions Precautions: Fall Precaution Comments: dizzy EOB   BP 89/52 with OT, Not dizzy after fluids Restrictions RLE Weight Bearing: Weight bearing as tolerated    Mobility  Bed Mobility Overal bed mobility: Needs Assistance Bed Mobility: Supine to Sit     Supine to sit: Modified independent (Device/Increase time)     General bed mobility comments: oob  Transfers Overall transfer level: Needs assistance Equipment used: Rolling walker (2 wheeled) Transfers: Sit to/from Stand Sit to Stand: Supervision         General transfer comment: not dizzy  Ambulation/Gait Ambulation/Gait assistance: Supervision Ambulation Distance (Feet): 500 Feet Assistive device: Rolling walker (2 wheeled) Gait Pattern/deviations: Step-to pattern     General Gait Details: improved in mobility   Stairs            Wheelchair Mobility    Modified Rankin (Stroke Patients Only)       Balance                                    Cognition Arousal/Alertness: Awake/alert Behavior During Therapy: WFL for tasks assessed/performed Overall Cognitive Status: Within Functional Limits for tasks assessed                      Exercises Total Joint Exercises Ankle Circles/Pumps: AROM;Right;10 reps Quad Sets: AROM;Right;10 reps Short Arc Quad: AROM;Right;10 reps Heel Slides: AROM;Right;10 reps Hip ABduction/ADduction: AROM;Right;10 reps Long Arc Quad: AROM;Right;10 reps Marching in Standing: AROM;Right;10 reps;Standing Standing Hip Extension:  AROM;Right;10 reps;Standing    General Comments        Pertinent Vitals/Pain Pain Score: 2  Pain Location: R  hip Pain Descriptors / Indicators: Discomfort Pain Intervention(s): Monitored during session;Premedicated before session;Repositioned;Ice applied    Home Living                      Prior Function            PT Goals (current goals can now be found in the care plan section) Progress towards PT goals: Progressing toward goals    Frequency    7X/week      PT Plan Current plan remains appropriate    Co-evaluation             End of Session   Activity Tolerance: Patient tolerated treatment well Patient left: in bed;with call bell/phone within reach;with family/visitor present     Time: 1339-1430 PT Time Calculation (min) (ACUTE ONLY): 51 min  Charges:  $Gait Training: 8-22 mins $Therapeutic Exercise: 8-22 mins $Therapeutic Activity: 8-22 mins                    G Codes:      Rada HayHill, Matthias Bogus [name] 10/19/2016, 3:36 PM

## 2016-10-19 NOTE — Progress Notes (Signed)
Occupational Therapy Treatment Patient Details Name: Rita Berry MRN: [id number] DOB: 1956/11/04 Today's Date: 10/19/2016    History of present illness Rita Berry   OT comments  Completed education this session  Follow Up Recommendations    24/7 supervision   Equipment Recommendations    none   Recommendations for Other Services      Precautions / Restrictions Falls        Mobility Bed Mobility Overal bed mobility: Needs Assistance Bed Mobility: Supine to Sit          General bed mobility comments: oob  Transfers Overall transfer level: Needs assistance Equipment used: Rolling walker (2 wheeled) Transfers: Sit to/from Stand Sit to Stand: Min assist;         General transfer comment: not dizzy    Balance                                   ADL                           Toilet Transfer: Min guard;Ambulation;BSC;RW   Toileting- Clothing Manipulation and Hygiene: Supervision/safety;Sit to/from stand   Tub/ Shower Transfer: Walk-in shower;Min guard;Ambulation;3 in 1     General ADL Comments: practiced with reacher and sock aid to remove and don socks.  Educated on and practiced bathroom transfers      Vision                     Perception     Praxis      Cognition   Behavior During Therapy: WFL for tasks assessed/performed Overall Cognitive Status: Within Functional Limits for tasks assessed                       Extremity/Trunk Assessment               Exercises     Shoulder Instructions       General Comments      Pertinent Vitals/ Pain       Pain Score: 3  Pain Location: R hip Pain Descriptors / Indicators: Discomfort Pain Intervention(s): Limited activity within patient's tolerance;Monitored during session;Premedicated before session;Repositioned  Home Living                                          Prior Functioning/Environment              Frequency            Progress Toward Goals  OT Goals(current goals can now be found in the care plan section)   goals met     Plan      Co-evaluation                 End of Session  left with PT   Activity Tolerance  tolerated well   Patient Left     Nurse Communication          Time: 8882-8003 OT Time Calculation (min): 20 min  Charges: OT General Charges $OT Visit: 1 Procedure OT Treatments $Self Care/Home Management : 8-22 mins  Rita Berry 10/19/2016, 2:34 PM Lesle Chris, OTR/L [phone] 10/19/2016

## 2016-10-19 NOTE — Progress Notes (Signed)
     Subjective: 1 Day Post-Op Procedure(s) (LRB): RIGHT TOTAL HIP ARTHROPLASTY ANTERIOR APPROACH (Right)   Patient reports pain as moderate, pain controlled.  She does feel a sharp pain at time, but also feels that she got behind on the pain control because of trying to get some sleep.   No events throughout the night. Ready to be discharged home if the pain is controlled and she does well with PT.   Objective:   VITALS:   Vitals:   10/19/16 0103 10/19/16 0536  BP: (!) 123/54 117/66  Pulse: 68 65  Resp: 16 14  Temp: 98 F (36.7 C) 98 F (36.7 C)    Dorsiflexion/Plantar flexion intact Incision: dressing C/D/I No cellulitis present Compartment soft  LABS  Recent Labs  10/19/16 0436  HGB 11.9*  HCT 35.4*  WBC 8.0  PLT 204     Recent Labs  10/19/16 0436  NA 139  K 4.2  BUN 7  CREATININE 0.57  GLUCOSE 105*     Assessment/Plan: 1 Day Post-Op Procedure(s) (LRB): RIGHT TOTAL HIP ARTHROPLASTY ANTERIOR APPROACH (Right) Foley cath d/c'ed Advance diet Up with therapy D/C IV fluids Discharge home with home health  [facility]. Follow up with OLIN,Rocio Wolak D in 2 weeks.  Contact information:  [facility] [address] [place] [id number] [phone]         Anastasio AuerbachMatthew S. Codey Burling   PAC  10/19/2016, 9:06 AM

## 2016-10-19 NOTE — Progress Notes (Signed)
   10/19/16 1000  Clinical Encounter Type  Visited With Patient  Visit Type Initial  Referral From Chaplain  Consult/Referral To Chaplain  [facility] stopped in on "cold call", visited with patient. Rita Berry 10/19/16

## 2016-10-19 NOTE — Discharge Instructions (Signed)

## 2016-10-25 ENCOUNTER — Encounter: Payer: Self-pay | Admitting: Physician Assistant

## 2016-10-26 NOTE — Discharge Summary (Signed)
Physician Discharge Summary  Patient ID: Rita Berry MRN: [id number] DOB/AGE: 1956-09-10 60 y.o.  Admit date: 10/18/2016 Discharge date: 10/19/2016   Procedures:  Procedure(s) (LRB): RIGHT TOTAL HIP ARTHROPLASTY ANTERIOR APPROACH (Right)  Attending Physician:  Dr. Durene RomansMatthew Olin   Admission Diagnoses:   Right hip primary OA / pain  Discharge Diagnoses:  Principal Problem:   S/P right THA, AA  Past Medical History:  Diagnosis Date  . Arthritis    hip right   . CIN I (cervical intraepithelial neoplasia I)   . Complication of anesthesia   . Humerus distal fracture    left-08-16-16 recent surgery to repair-"no issues"  . Hypertension    not currently taking med due to causing dizziness-MD aware.  . Ovarian cyst   . PONV (postoperative nausea and vomiting)   . Vertigo    "Positional vertigo"- head elevation best    HPI:    Rita Berry, 60 y.o. female, has a history of pain and functional disability in the right hip(s) due to arthritis and patient has failed non-surgical conservative treatments for greater than 12 weeks to include NSAID's and/or analgesics, corticosteriod injections and activity modification.  Onset of symptoms was gradual starting >10 years ago with gradually worsening course since that time.The patient noted no past surgery on the right hip(s).  Patient currently rates pain in the right hip at 8 out of 10 with activity. Patient has night pain, worsening of pain with activity and weight bearing, trendelenberg gait, pain that interfers with activities of daily living and pain with passive range of motion. Patient has evidence of periarticular osteophytes and joint space narrowing by imaging studies. This condition presents safety issues increasing the risk of falls. There is no current active infection.   Risks, benefits and expectations were discussed with the patient.  Risks including but not limited to the risk of anesthesia, blood clots, nerve damage, blood vessel  damage, failure of the prosthesis, infection and up to and including death.  Patient understand the risks, benefits and expectations and wishes to proceed with surgery.  PCP: [name], PA-C   Discharged Condition: good  Hospital Course:  Patient underwent the above stated procedure on 10/18/2016. Patient tolerated the procedure well and brought to the recovery room in good condition and subsequently to the floor.  POD #1 BP: 117/66 ; Pulse: 65 ; Temp: 98 F (36.7 C) ; Resp: 14 Patient reports pain as moderate, pain controlled.  She does feel a sharp pain at time, but also feels that she got behind on the pain control because of trying to get some sleep.   No events throughout the night. Ready to be discharged home. Dorsiflexion/plantar flexion intact, incision: dressing C/D/I, no cellulitis present and compartment soft.   LABS  Basename    HGB     11.9  HCT     35.4    Discharge Exam: General appearance: alert, cooperative and no distress Extremities: Homans sign is negative, no sign of DVT, no edema, redness or tenderness in the calves or thighs and no ulcers, gangrene or trophic changes  Disposition: Home with follow up in 2 weeks   Follow-up Information    [facility] .   Why:  home health physical therapy Contact information: [address] [place] [address] [phone]        Shelda PalLIN,Bryden Darden D, MD. Schedule an appointment as soon as possible for a visit in 2 week(s).   Specialty:  Orthopedic Surgery Contact  information: [address] [place] [address] [phone]           Discharge Instructions    Call MD / Call 911    Complete by:  As directed    If you experience chest pain or shortness of breath, CALL 911 and be transported to the hospital emergency room.  If you develope a fever above 101 F, pus (white drainage) or increased drainage or redness at the wound, or calf pain, call your surgeon's office.     Change dressing    Complete by:  As directed    Maintain surgical dressing until follow up in the clinic. If the edges start to pull up, may reinforce with tape. If the dressing is no longer working, may remove and cover with gauze and tape, but must keep the area dry and clean.  Call with any questions or concerns.   Constipation Prevention    Complete by:  As directed    Drink plenty of fluids.  Prune juice may be helpful.  You may use a stool softener, such as Colace (over the counter) 100 mg twice a day.  Use MiraLax (over the counter) for constipation as needed.   Diet - low sodium heart healthy    Complete by:  As directed    Discharge instructions    Complete by:  As directed    Maintain surgical dressing until follow up in the clinic. If the edges start to pull up, may reinforce with tape. If the dressing is no longer working, may remove and cover with gauze and tape, but must keep the area dry and clean.  [facility]. Call with any questions or concerns.   Increase activity slowly as tolerated    Complete by:  As directed    Weight bearing as tolerated with assist device (walker, cane, etc) as directed, use it as long as suggested by your surgeon or therapist, typically at least 4-6 weeks.   TED hose    Complete by:  As directed    Use stockings (TED hose) for 2 weeks on both leg(s).  You may remove them at night for sleeping.        Medication List    STOP taking these medications   ALEVE 220 MG Caps Generic drug:  Naproxen Sodium     TAKE these medications   acetaminophen 500 MG tablet Commonly known as:  TYLENOL Take 125-500 mg by mouth every 6 (six) hours as needed (for pain).   aspirin 81 MG chewable tablet Chew 1 tablet (81 mg total) by mouth 2 (two) times daily. Take for 4 weeks.   Calcium-Magnesium 500-250 MG Tabs Take 1 tablet by mouth 2 (two) times daily.   chlorthalidone 25 MG tablet Commonly known as:  HYGROTON TAKE  1/2 TO 1 TABLET(12.5 TO 25 MG) BY MOUTH DAILY What changed:  See the new instructions.   docusate sodium 100 MG capsule Commonly known as:  COLACE Take 1 capsule (100 mg total) by mouth 2 (two) times daily.   ferrous sulfate 325 (65 FE) MG tablet Take 1 tablet (325 mg total) by mouth 3 (three) times daily after meals.   methocarbamol 500 MG tablet Commonly known as:  ROBAXIN Take 1 tablet (500 mg total) by mouth every 6 (six) hours as needed for muscle spasms.   multivitamin tablet Take 1 tablet by mouth daily. Reported on 06/14/2016   nitroGLYCERIN 0.2 mg/hr patch Commonly known as:  NITRODUR - Dosed in mg/24 hr APPLY 1/2 PATCH ONTO SKIN EVERY 24 HOURS What changed:  See the new instructions.   NON FORMULARY Apply 2 g topically 2 (two) times daily as needed (For hip pain). This is to be compounded with: Baclofen 2% Diclofenac 5% Gabapentin 6% Ketamine 10% lidocaine 3%   polyethylene glycol packet Commonly known as:  MIRALAX / GLYCOLAX Take 17 g by mouth 2 (two) times daily.   traMADol 50 MG tablet Commonly known as:  ULTRAM Take 1-2 tablets (50-100 mg total) by mouth every 6 (six) hours as needed. What changed:  how much to take  how to take this  when to take this  reasons to take this  additional instructions   Vitamin D 2000 units Caps Take 2,000 Units by mouth daily.        Signed: Anastasio Auerbach. Raphel Stickles   PA-C  10/26/2016, 9:38 AM

## 2016-10-29 ENCOUNTER — Ambulatory Visit (INDEPENDENT_AMBULATORY_CARE_PROVIDER_SITE_OTHER): Payer: PRIVATE HEALTH INSURANCE | Admitting: Family Medicine

## 2016-10-29 VITALS — BP 116/76 | HR 86 | Temp 98.2°F | Resp 16 | Ht 66.5 in | Wt 131.0 lb

## 2016-10-29 DIAGNOSIS — M1611 Unilateral primary osteoarthritis, right hip: Secondary | ICD-10-CM

## 2016-10-29 DIAGNOSIS — I10 Essential (primary) hypertension: Secondary | ICD-10-CM

## 2016-10-29 DIAGNOSIS — J301 Allergic rhinitis due to pollen: Secondary | ICD-10-CM

## 2016-10-29 DIAGNOSIS — H8111 Benign paroxysmal vertigo, right ear: Secondary | ICD-10-CM

## 2016-10-29 DIAGNOSIS — Z96641 Presence of right artificial hip joint: Secondary | ICD-10-CM

## 2016-10-29 LAB — COMPREHENSIVE METABOLIC PANEL
ALBUMIN: 3.9 g/dL (ref 3.6–5.1)
ALT: 12 U/L (ref 6–29)
AST: 15 U/L (ref 10–35)
Alkaline Phosphatase: 57 U/L (ref 33–130)
BILIRUBIN TOTAL: 0.3 mg/dL (ref 0.2–1.2)
BUN: 14 mg/dL (ref 7–25)
CHLORIDE: 102 mmol/L (ref 98–110)
CO2: 28 mmol/L (ref 20–31)
CREATININE: 0.65 mg/dL (ref 0.50–0.99)
Calcium: 9.2 mg/dL (ref 8.6–10.4)
GLUCOSE: 90 mg/dL (ref 65–99)
Potassium: 4.7 mmol/L (ref 3.5–5.3)
SODIUM: 139 mmol/L (ref 135–146)
Total Protein: 6.7 g/dL (ref 6.1–8.1)

## 2016-10-29 LAB — POCT CBC
Granulocyte percent: 73.3 %G (ref 37–80)
HEMATOCRIT: 34.9 % — AB (ref 37.7–47.9)
HEMOGLOBIN: 12.6 g/dL (ref 12.2–16.2)
Lymph, poc: 1.4 (ref 0.6–3.4)
MCH: 33.6 pg — AB (ref 27–31.2)
MCHC: 36.2 g/dL — AB (ref 31.8–35.4)
MCV: 92.8 fL (ref 80–97)
MID (cbc): 0.7 (ref 0–0.9)
MPV: 6.5 fL (ref 0–99.8)
PLATELET COUNT, POC: 386 10*3/uL (ref 142–424)
POC GRANULOCYTE: 5.8 (ref 2–6.9)
POC LYMPH %: 18.2 % (ref 10–50)
POC MID %: 8.5 %M (ref 0–12)
RBC: 3.76 M/uL — AB (ref 4.04–5.48)
RDW, POC: 12.1 %
WBC: 7.9 10*3/uL (ref 4.6–10.2)

## 2016-10-29 MED ORDER — MECLIZINE HCL 25 MG PO TABS: 25.0000 mg | ORAL_TABLET | Freq: Three times a day (TID) | ORAL | 0 refills | Status: DC | PRN

## 2016-10-29 NOTE — Progress Notes (Signed)
Subjective:    Patient ID: Rita Berry, female    DOB: 07-01-1956, 60 y.o.   MRN: [id number]  10/29/2016  Dizziness   HPI This 60 y.o. female presents for evaluation of vertigo.  Just underwent total hip replacement on R on 10/18/16.  Doing well post-operatively with development of dizziness in the past week.  Previous BPV in the past but has not occurred recently.  Occurs with turning head a certain direction yet can be either direction. Denies hearing loss sudden or new onset tinnitus.  Denies nausea or vomiting. Denies headache, blurred vision, diplopia, focal weakness, or paresthesias.  Has been slightly congested lately; usually does not take anything for allergies.  No fever/chills/sweats.  Has been eating and drinking well.  No new medications.  Has been taking ginger for supportive care.  No other medication for dizziness.  Interfering with rehab of hip.   Review of Systems  Constitutional: Negative for chills, diaphoresis, fatigue and fever.  HENT: Positive for congestion, postnasal drip and rhinorrhea. Negative for ear pain, hearing loss, nosebleeds, sinus pain, sinus pressure, sneezing, tinnitus, trouble swallowing and voice change.   Eyes: Negative for visual disturbance.  Respiratory: Negative for cough and shortness of breath.   Cardiovascular: Negative for chest pain, palpitations and leg swelling.  Gastrointestinal: Negative for abdominal pain, constipation, diarrhea, nausea and vomiting.  Endocrine: Negative for cold intolerance, heat intolerance, polydipsia, polyphagia and polyuria.  Musculoskeletal: Positive for arthralgias and gait problem.  Neurological: Positive for dizziness. Negative for tremors, seizures, syncope, facial asymmetry, speech difficulty, weakness, light-headedness, numbness and headaches.  Psychiatric/Behavioral: Negative for dysphoric mood, self-injury, sleep disturbance and suicidal ideas. The patient is not nervous/anxious.     Past Medical  History:  Diagnosis Date  . Arthritis    hip right   . CIN I (cervical intraepithelial neoplasia I)   . Complication of anesthesia   . Humerus distal fracture    left-08-16-16 recent surgery to repair-"no issues"  . Hypertension    not currently taking med due to causing dizziness-MD aware.  . Ovarian cyst   . PONV (postoperative nausea and vomiting)   . Vertigo    "Positional vertigo"- head elevation best   Past Surgical History:  Procedure Laterality Date  . ABDOMINAL HYSTERECTOMY    . ABDOMINAL SURGERY     Ovarian cyst  . COLPOSCOPY    . GYNECOLOGIC CRYOSURGERY    . JOINT REPLACEMENT    . Joint Replacement Right 10/18/2016   RATH  . OOPHORECTOMY     BSO  . ORIF HUMERUS FRACTURE Left 08/16/2016   Procedure: OPEN TREATMENT OF LEFT DISTAL HUMERUS FRACTURE;  Surgeon: Mack Hookavid Thompson, MD;  Location: [facility];  Service: Orthopedics;  Laterality: Left;  GENERAL ANESTHESIA WITH PRE-OP BLOCK  . TOTAL HIP ARTHROPLASTY Right 10/18/2016   Procedure: RIGHT TOTAL HIP ARTHROPLASTY ANTERIOR APPROACH;  Surgeon: Durene RomansMatthew Olin, MD;  Location: [facility];  Service: Orthopedics;  Laterality: Right;  . TUBAL LIGATION     Allergies  Allergen Reactions  . Codeine Nausea And Vomiting  . Latex Itching    Social History   Social History  . Marital status: Married    Spouse name: [name]  . Number of children: 0  . Years of education: EdD   Occupational History  . Wellness Art gallery managerDirector     Residents and Staff at [employer]   Social History Main Topics  . Smoking status: Former Smoker    Quit date: 10/10/1990  . Smokeless  tobacco: Never Used  . Alcohol use 5.4 oz/week    5 Glasses of wine, 4 Standard drinks or equivalent per week     Comment: social  . Drug use: No  . Sexual activity: Not Currently    Partners: Male   Other Topics Concern  . Not on file   Social History Narrative   Completed Doctor of Education in Kinesiology from [employer] 04/2013.   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   Lives with her husband.   Family History  Problem Relation Age of Onset  . Hypertension Mother   . Dementia Mother   . Bradycardia Mother   . Hypertension Father   . Hyperlipidemia Father   . Diabetes Sister     gestational  . Diabetes Maternal Grandmother   . Stroke Maternal Grandmother   . Stroke Maternal Grandfather        Objective:    BP 116/76   Pulse 86   Temp 98.2 F (36.8 C) (Oral)   Resp 16   Ht 5' 6.5" (1.689 m)   Wt 131 lb (59.4 kg)   SpO2 99%   BMI 20.83 kg/m  Physical Exam  Constitutional: She is oriented to person, place, and time. She appears well-developed and well-nourished. No distress.  HENT:  Head: Normocephalic and atraumatic.  Right Ear: External ear normal.  Left Ear: External ear normal.  Nose: Nose normal.  Mouth/Throat: Oropharynx is clear and moist.  Eyes: Conjunctivae and EOM are normal. Pupils are equal, round, and reactive to light.  Neck: Normal range of motion. Neck supple. Carotid bruit is not present. No thyromegaly present.  Cardiovascular: Normal rate, regular rhythm, normal heart sounds and intact distal pulses.  Exam reveals no gallop and no friction rub.   No murmur heard. Pulmonary/Chest: Effort normal and breath sounds normal. She has no wheezes. She has no rales.  Abdominal: Soft. Bowel sounds are normal. She exhibits no distension and no mass. There is no tenderness. There is no rebound and no guarding.  Lymphadenopathy:    She has no cervical adenopathy.  Neurological: She is alert and oriented to person, place, and time. No cranial nerve deficit. She exhibits normal muscle tone. Coordination normal.  Dix Hallpike equivocal; mild dizziness reproduced to the R.  Skin: Skin is warm and dry. No rash noted. She is not diaphoretic. No erythema. No pallor.  Psychiatric: She has a normal mood and  affect. Her behavior is normal. Judgment and thought content normal.   Results for orders placed or performed in visit on 10/29/16  Comprehensive metabolic panel  Result Value Ref Range   Sodium 139 135 - 146 mmol/L   Potassium 4.7 3.5 - 5.3 mmol/L   Chloride 102 98 - 110 mmol/L   CO2 28 20 - 31 mmol/L   Glucose, Bld 90 65 - 99 mg/dL   BUN 14 7 - 25 mg/dL   Creat 1.61 0.96 - 0.45 mg/dL   Total Bilirubin 0.3 0.2 - 1.2 mg/dL   Alkaline Phosphatase 57 33 - 130 U/L   AST 15 10 - 35 U/L   ALT 12 6 - 29 U/L   Total Protein 6.7 6.1 - 8.1 g/dL   Albumin 3.9 3.6 - 5.1 g/dL   Calcium 9.2 8.6 - 40.9 mg/dL  POCT CBC  Result Value Ref Range   WBC 7.9 4.6 - 10.2 K/uL  Lymph, poc 1.4 0.6 - 3.4   POC LYMPH PERCENT 18.2 10 - 50 %L   MID (cbc) 0.7 0 - 0.9   POC MID % 8.5 0 - 12 %M   POC Granulocyte 5.8 2 - 6.9   Granulocyte percent 73.3 37 - 80 %G   RBC 3.76 (A) 4.04 - 5.48 M/uL   Hemoglobin 12.6 12.2 - 16.2 g/dL   HCT, POC 96.0 (A) 45.4 - 47.9 %   MCV 92.8 80 - 97 fL   MCH, POC 33.6 (A) 27 - 31.2 pg   MCHC 36.2 (A) 31.8 - 35.4 g/dL   RDW, POC 09.8 %   Platelet Count, POC 386 142 - 424 K/uL   MPV 6.5 0 - 99.8 fL       Assessment & Plan:   1. Benign paroxysmal positional vertigo of right ear   2. Acute seasonal allergic rhinitis due to pollen   3. Primary osteoarthritis of right hip   4. Status post right hip replacement   5. Essential hypertension    -New/recurrent. -rx for Meclizine provided. -Eply maneuvers provided and advised to perform daily. -recommend nasal saline irrigation daily for allergic rhinitis. -normal neurological exam in office; due to recent surgery, will obtain labs to rule out secondary causes.   Orders Placed This Encounter  Procedures  . Comprehensive metabolic panel  . POCT CBC   Meds ordered this encounter  Medications  . meclizine (ANTIVERT) 25 MG tablet    Sig: Take 1 tablet (25 mg total) by mouth 3 (three) times daily as needed for dizziness.      Dispense:  30 tablet    Refill:  0    No Follow-up on file.   Haylie Mccutcheon Paulita Fujita, M.D. [facility] [address] [place], [place]  [address] [phone] phone [phone] fax

## 2016-10-29 NOTE — Patient Instructions (Addendum)
   IF you received an x-ray today, you will receive an invoice from [facility]. Please contact [facility] at [phone] with questions or concerns regarding your invoice.   IF you received labwork today, you will receive an invoice from [facility]. Please contact [facility] at [phone] with questions or concerns regarding your invoice.   Our billing staff will not be able to assist you with questions regarding bills from these companies.  You will be contacted with the lab results as soon as they are available. The fastest way to get your results is to activate your My Chart account. Instructions are located on the last page of this paperwork. If you have not heard from us regarding the results in 2 weeks, please contact this office.     Epley Maneuver Self-Care WHAT IS THE EPLEY MANEUVER? The Epley maneuver is an exercise you can do to relieve symptoms of benign paroxysmal positional vertigo (BPPV). This condition is often just referred to as vertigo. BPPV is caused by the movement of tiny crystals (canaliths) inside your inner ear. The accumulation and movement of canaliths in your inner ear causes a sudden spinning sensation (vertigo) when you move your head to certain positions. Vertigo usually lasts about 30 seconds. BPPV usually occurs in just one ear. If you get vertigo when you lie on your left side, you probably have BPPV in your left ear. Your health care provider can tell you which ear is involved.  BPPV may be caused by a head injury. Many people older than 50 get BPPV for unknown reasons. If you have been diagnosed with BPPV, your health care provider may teach you how to do this maneuver. BPPV is not life threatening (benign) and usually goes away in time.  WHEN SHOULD I PERFORM THE EPLEY MANEUVER? You can do this maneuver at home whenever you have symptoms of vertigo. You may do the Epley maneuver up to 3 times a day until your  symptoms of vertigo go away. HOW SHOULD I DO THE EPLEY MANEUVER? 1. Sit on the edge of a bed or table with your back straight. Your legs should be extended or hanging over the edge of the bed or table.  2. Turn your head halfway toward the affected ear.  3. Lie backward quickly with your head turned until you are lying flat on your back. You may want to position a pillow under your shoulders.  4. Hold this position for 30 seconds. You may experience an attack of vertigo. This is normal. Hold this position until the vertigo stops. 5. Then turn your head to the opposite direction until your unaffected ear is facing the floor.  6. Hold this position for 30 seconds. You may experience an attack of vertigo. This is normal. Hold this position until the vertigo stops. 7. Now turn your whole body to the same side as your head. Hold for another 30 seconds.  8. You can then sit back up. ARE THERE RISKS TO THIS MANEUVER? In some cases, you may have other symptoms (such as changes in your vision, weakness, or numbness). If you have these symptoms, stop doing the maneuver and call your health care provider. Even if doing these maneuvers relieves your vertigo, you may still have dizziness. Dizziness is the sensation of light-headedness but without the sensation of movement. Even though the Epley maneuver may relieve your vertigo, it is possible that your symptoms will return within 5 years. WHAT SHOULD I DO AFTER THIS   MANEUVER? After doing the Epley maneuver, you can return to your normal activities. Ask your doctor if there is anything you should do at home to prevent vertigo. This may include:  Sleeping with two or more pillows to keep your head elevated.  Not sleeping on the side of your affected ear.  Getting up slowly from bed.  Avoiding sudden movements during the day.  Avoiding extreme head movement, like looking up or bending over.  Wearing a cervical collar to prevent sudden head  movements. WHAT SHOULD I DO IF MY SYMPTOMS GET WORSE? Call your health care provider if your vertigo gets worse. Call your provider right way if you have other symptoms, including:   Nausea.  Vomiting.  Headache.  Weakness.  Numbness.  Vision changes.   This information is not intended to replace advice given to you by your health care provider. Make sure you discuss any questions you have with your health care provider.   Document Released: 12/10/2013 Document Reviewed: 12/10/2013 Elsevier Interactive Patient Education 2016 Elsevier Inc.  

## 2016-11-05 ENCOUNTER — Encounter: Payer: Self-pay | Admitting: Family Medicine

## 2016-11-05 DIAGNOSIS — H8113 Benign paroxysmal vertigo, bilateral: Secondary | ICD-10-CM

## 2016-11-14 ENCOUNTER — Encounter: Payer: Self-pay | Admitting: Family Medicine

## 2016-11-21 ENCOUNTER — Emergency Department (HOSPITAL_COMMUNITY): Payer: PRIVATE HEALTH INSURANCE

## 2016-11-21 ENCOUNTER — Ambulatory Visit: Payer: PRIVATE HEALTH INSURANCE

## 2016-11-21 ENCOUNTER — Emergency Department (HOSPITAL_COMMUNITY)
Admission: EM | Admit: 2016-11-21 | Discharge: 2016-11-21 | Disposition: A | Discharge status: Home / Self Care | Payer: PRIVATE HEALTH INSURANCE | Attending: Emergency Medicine | Admitting: Emergency Medicine

## 2016-11-21 ENCOUNTER — Encounter (HOSPITAL_COMMUNITY): Payer: Self-pay | Admitting: Emergency Medicine

## 2016-11-21 DIAGNOSIS — Z91040 Latex allergy status: Secondary | ICD-10-CM | POA: Diagnosis not present

## 2016-11-21 DIAGNOSIS — I10 Essential (primary) hypertension: Secondary | ICD-10-CM | POA: Insufficient documentation

## 2016-11-21 DIAGNOSIS — Z96641 Presence of right artificial hip joint: Secondary | ICD-10-CM | POA: Insufficient documentation

## 2016-11-21 DIAGNOSIS — R55 Syncope and collapse: Secondary | ICD-10-CM | POA: Diagnosis not present

## 2016-11-21 DIAGNOSIS — Z79899 Other long term (current) drug therapy: Secondary | ICD-10-CM | POA: Insufficient documentation

## 2016-11-21 DIAGNOSIS — Z87891 Personal history of nicotine dependence: Secondary | ICD-10-CM | POA: Diagnosis not present

## 2016-11-21 LAB — URINALYSIS, ROUTINE W REFLEX MICROSCOPIC
Bilirubin Urine: NEGATIVE
GLUCOSE, UA: NEGATIVE mg/dL
Hgb urine dipstick: NEGATIVE
Ketones, ur: NEGATIVE mg/dL
LEUKOCYTES UA: NEGATIVE
Nitrite: NEGATIVE
PH: 7.5 (ref 5.0–8.0)
PROTEIN: NEGATIVE mg/dL
Specific Gravity, Urine: 1.007 (ref 1.005–1.030)

## 2016-11-21 LAB — CBC WITH DIFFERENTIAL/PLATELET
BASOS ABS: 0 10*3/uL (ref 0.0–0.1)
Basophils Relative: 0 %
Eosinophils Absolute: 0.1 10*3/uL (ref 0.0–0.7)
Eosinophils Relative: 1 %
HEMATOCRIT: 39.6 % (ref 36.0–46.0)
HEMOGLOBIN: 13.2 g/dL (ref 12.0–15.0)
LYMPHS PCT: 25 %
Lymphs Abs: 1.4 10*3/uL (ref 0.7–4.0)
MCH: 32 pg (ref 26.0–34.0)
MCHC: 33.3 g/dL (ref 30.0–36.0)
MCV: 96.1 fL (ref 78.0–100.0)
MONO ABS: 0.3 10*3/uL (ref 0.1–1.0)
Monocytes Relative: 5 %
NEUTROS ABS: 3.7 10*3/uL (ref 1.7–7.7)
Neutrophils Relative %: 69 %
Platelets: 233 10*3/uL (ref 150–400)
RBC: 4.12 MIL/uL (ref 3.87–5.11)
RDW: 12.7 % (ref 11.5–15.5)
WBC: 5.5 10*3/uL (ref 4.0–10.5)

## 2016-11-21 LAB — BASIC METABOLIC PANEL
ANION GAP: 7 (ref 5–15)
BUN: 11 mg/dL (ref 6–20)
CO2: 26 mmol/L (ref 22–32)
Calcium: 8.3 mg/dL — ABNORMAL LOW (ref 8.9–10.3)
Chloride: 106 mmol/L (ref 101–111)
Creatinine, Ser: 0.62 mg/dL (ref 0.44–1.00)
GFR calc Af Amer: >60 mL/min (ref >60)
GLUCOSE: 91 mg/dL (ref 65–99)
POTASSIUM: 4.1 mmol/L (ref 3.5–5.1)
Sodium: 139 mmol/L (ref 135–145)

## 2016-11-21 LAB — I-STAT TROPONIN, ED: Troponin i, poc: 0.04 ng/mL (ref 0.00–0.08)

## 2016-11-21 NOTE — ED Triage Notes (Signed)
Per EMS- Patient had an episode of dizziness and tunnel while at work. Patient reports that she is suppose to be wearing a NTg patch for hypertension, but has not been wearing because she felt like it was dropping her BP too low. Patient states she is now hypertensive.

## 2016-11-21 NOTE — ED Triage Notes (Signed)
Pt c/o dizziness, hypertension, near syncope, last episode today around 1400. Pt used to take 0.25 chlorthalidone pill x several years without side effects, BP was well controlled. 1.5 years ago pt taken off of chlorthalidone and put on Lasix due to calcium. Lasix did not appropriately control BP. Pt put on Nitroglycerin patch to improve hip circulation. In October chlorthalidone suddenly began causing dizziness, so pt discontinued chlorthalidone. Pt has not been taking blood pressure medicine x several weeks. Began taking Nitroglycerin patch again but BP has been unstable. On Friday patient had syncopal episode and discovered BP was high.

## 2016-11-21 NOTE — ED Provider Notes (Signed)
[facility]-EMERGENCY DEPT Provider Note   CSN: [id number] Arrival date & time: 11/21/16  1534     History   Chief Complaint Chief Complaint  Patient presents with  . Hypertension  . Dizziness    HPI Rita Berry is a 60 y.o. female.  The history is provided by the patient. No language interpreter was used.  Hypertension   Dizziness   Rita Berry is a 60 y.o. female who presents to the Emergency Department complaining of dizziness.  She has a history of hypertension as well as vertigo in the past. Currently her hypertension is treated with a nitroglycerin patch. 3 days ago she developed a feeling of dizziness and vertiginous type symptoms and associated feeling as if she might pass out and the symptoms were predominately on the right side of her head. She attributed this to her blood pressure being low even though she did not check her blood pressure at that time and she stopped wearing her nitroglycerin patch. Her symptoms lasted for less than an hour and she had no associated headache, chest pain, shortness of breath, numbness, focal weakness, lower extremity swelling or pain. She reports associated tunnel vision during the event. Following the event she felt well and was in her routine state of health. Today she had a recurrent event while she was at work with similar symptoms. Her blood pressure was checked at that time it was noted to be 190/100. Her baseline blood pressure is in the 150 range. Currently she is back to her baseline and has no residual dizziness.   Past Medical History:  Diagnosis Date  . Arthritis    hip right   . CIN I (cervical intraepithelial neoplasia I)   . Complication of anesthesia   . Humerus distal fracture    left-08-16-16 recent surgery to repair-"no issues"  . Hypertension    not currently taking med due to causing dizziness-MD aware.  . Ovarian cyst   . PONV (postoperative nausea and vomiting)   . Vertigo    "Positional vertigo"- head elevation best      Patient Active Problem List   Diagnosis Date Noted  . 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 Osteoarthritis of right hip 06/14/2016  . Essential hypertension 06/12/2015  . Diverticulosis 06/12/2015  . Pain in joint, ankle and foot 09/16/2014  . Bilateral bunions 09/16/2014  . Iliotibial band syndrome of left side 10/30/2013  . Greater trochanteric bursitis of both hips 10/30/2013  . Abnormality of gait 06/18/2013  . Eucalcemic Hyperparathyroidism 04/19/2013  . Osteopenia 04/19/2013  . CIN I (cervical intraepithelial neoplasia I)   . Ovarian cyst     Past Surgical History:  Procedure Laterality Date  . ABDOMINAL HYSTERECTOMY    . ABDOMINAL SURGERY     Ovarian cyst  . COLPOSCOPY    . GYNECOLOGIC CRYOSURGERY    . JOINT REPLACEMENT    . Joint Replacement Right 10/18/2016   RATH  . OOPHORECTOMY     BSO  . ORIF HUMERUS FRACTURE Left 08/16/2016   Procedure: OPEN TREATMENT OF LEFT DISTAL HUMERUS FRACTURE;  Surgeon: Mack Hook, MD;  Location: [facility];  Service: Orthopedics;  Laterality: Left;  GENERAL ANESTHESIA WITH PRE-OP BLOCK  . TOTAL HIP ARTHROPLASTY Right 10/18/2016   Procedure: RIGHT TOTAL HIP ARTHROPLASTY ANTERIOR APPROACH;  Surgeon: Durene Romans, MD;  Location: [facility];  Service: Orthopedics;  Laterality: Right;  . TUBAL LIGATION      OB History    Gravida Para Term Preterm  AB Living   0             SAB TAB Ectopic Multiple Live Births                   Home Medications    Prior to Admission medications   Medication Sig Start Date End Date Taking? Authorizing Provider  Calcium-Magnesium 500-250 MG TABS Take 1 tablet by mouth 2 (two) times daily.   Yes Historical Provider, MD  chlorthalidone (HYGROTON) 25 MG tablet TAKE 1/2 TO 1 TABLET(12.5 TO 25 MG) BY MOUTH DAILY 07/14/16  Yes Chelle Jeffery, PA-C  Cholecalciferol (VITAMIN D) 2000 units CAPS Take 2,000 Units by mouth daily.   Yes Historical Provider, MD  ferrous sulfate 325 (65 FE) MG tablet Take  1 tablet (325 mg total) by mouth 3 (three) times daily after meals. 10/19/16  Yes Lanney GinsMatthew Babish, PA-C  Multiple Vitamin (MULTIVITAMIN) tablet Take 1 tablet by mouth daily. Reported on 06/14/2016   Yes Historical Provider, MD  meclizine (ANTIVERT) 25 MG tablet Take 1 tablet (25 mg total) by mouth 3 (three) times daily as needed for dizziness. Patient not taking: Reported on 11/21/2016 10/29/16   Ethelda ChickKristi M Smith, MD  methocarbamol (ROBAXIN) 500 MG tablet Take 1 tablet (500 mg total) by mouth every 6 (six) hours as needed for muscle spasms. Patient not taking: Reported on 11/21/2016 10/19/16   Lanney GinsMatthew Babish, PA-C  traMADol (ULTRAM) 50 MG tablet Take 1-2 tablets (50-100 mg total) by mouth every 6 (six) hours as needed. Patient not taking: Reported on 11/21/2016 10/19/16   Lanney GinsMatthew Babish, PA-C    Family History Family History  Problem Relation Age of Onset  . Hypertension Mother   . Dementia Mother   . Bradycardia Mother   . Hypertension Father   . Hyperlipidemia Father   . Diabetes Sister     gestational  . Diabetes Maternal Grandmother   . Stroke Maternal Grandmother   . Stroke Maternal Grandfather     Social History Social History  Substance Use Topics  . Smoking status: Former Smoker    Quit date: 10/10/1990  . Smokeless tobacco: Never Used  . Alcohol use 5.4 oz/week    5 Glasses of wine, 4 Standard drinks or equivalent per week     Comment: social     Allergies   Codeine and Latex   Review of Systems Review of Systems  Neurological: Positive for dizziness.  All other systems reviewed and are negative.    Physical Exam Updated Vital Signs BP 175/88 (BP Location: Left Arm)   Pulse 76   Resp 16   SpO2 94%   Physical Exam  Constitutional: She is oriented to person, place, and time. She appears well-developed and well-nourished. No distress.  HENT:  Head: Normocephalic and atraumatic.  Eyes: EOM are normal. Pupils are equal, round, and reactive to light.    Cardiovascular: Normal rate and regular rhythm.   No murmur heard. Pulmonary/Chest: Effort normal and breath sounds normal. No respiratory distress.  Abdominal: Soft. There is no tenderness. There is no rebound and no guarding.  Musculoskeletal: She exhibits no edema or tenderness.  Neurological: She is alert and oriented to person, place, and time. No cranial nerve deficit. Coordination normal.  5/5 strength in all four extremities, sensation to light touch intact in all four extremities. No pronator drift.   Skin: Skin is warm and dry. Capillary refill takes less than 2 seconds.  Psychiatric: She has a normal mood and affect. Her  behavior is normal.  Nursing note and vitals reviewed.    ED Treatments / Results  Labs (all labs ordered are listed, but only abnormal results are displayed) Labs Reviewed  BASIC METABOLIC PANEL - Abnormal; Notable for the following:       Result Value   Calcium 8.3 (*)    All other components within normal limits  URINALYSIS, ROUTINE W REFLEX MICROSCOPIC (NOT AT [facility])  CBC WITH DIFFERENTIAL/PLATELET  Rosezena Sensor-STAT TROPOININ, ED    EKG  EKG Interpretation  Date/Time:  Monday November 21 2016 15:55:42 EST Ventricular Rate:  81 PR Interval:    QRS Duration: 84 QT Interval:  394 QTC Calculation: 458 R Axis:   26 Text Interpretation:  Sinus rhythm Confirmed by Lincoln Brighamees, Liz [id number]) on 11/21/2016 5:11:38 PM       Radiology Ct Head Wo Contrast  Result Date: 11/21/2016 CLINICAL DATA:  Near syncope.  Headache.  History of hypertension. EXAM: CT HEAD WITHOUT CONTRAST TECHNIQUE: Contiguous axial images were obtained from the base of the skull through the vertex without intravenous contrast. COMPARISON:  None. FINDINGS: Brain: No evidence of acute infarction, hemorrhage, hydrocephalus, extra-axial collection or mass lesion/mass effect. Vascular: No hyperdense vessel or unexpected calcification. Skull: Normal. Negative for fracture or focal lesion. Sinuses/Orbits:  Visualize globes orbits are unremarkable. Visualized sinuses and mastoid air cells are clear. Other: None. IMPRESSION: Normal unenhanced CT scan the brain. Electronically Signed   By: Amie Portlandavid  Ormond M.D.   On: 11/21/2016 18:31    Procedures Procedures (including critical care time)  Medications Ordered in ED Medications - No data to display   Initial Impression / Assessment and Plan / ED Course  I have reviewed the triage vital signs and the nursing notes.  Pertinent labs & imaging results that were available during my care of the patient were reviewed by me and considered in my medical decision making (see chart for details).  Clinical Course     Patient here for evaluation of near syncopal symptoms with vertigo. She has a normal neurologic examination. Presentation is not consistent with subarachnoid hemorrhage, CVA, ACS, PE. Patient is asymptomatic in the emergency Department. Discussed home care for vertigo with near-syncope as well as asymptomatic hypertension with PCP follow-up and return precautions.  Final Clinical Impressions(s) / ED Diagnoses   Final diagnoses:  Near syncope  Essential hypertension    New Prescriptions Discharge Medication List as of 11/21/2016  7:06 PM       Tilden FossaElizabeth Matalyn Nawaz, MD 11/22/16 [id number]

## 2016-11-22 ENCOUNTER — Encounter: Payer: Self-pay | Admitting: Physician Assistant

## 2016-11-22 ENCOUNTER — Ambulatory Visit (INDEPENDENT_AMBULATORY_CARE_PROVIDER_SITE_OTHER): Payer: PRIVATE HEALTH INSURANCE | Admitting: Physician Assistant

## 2016-11-22 VITALS — BP 154/108 | HR 84 | Temp 98.4°F | Resp 18

## 2016-11-22 DIAGNOSIS — I10 Essential (primary) hypertension: Secondary | ICD-10-CM

## 2016-11-22 MED ORDER — CLONIDINE HCL 0.1 MG PO TABS: 0.1000 mg | ORAL_TABLET | Freq: Three times a day (TID) | ORAL | 0 refills | Status: DC

## 2016-11-22 NOTE — Patient Instructions (Addendum)
HOLD the chlorthalidone for now. Check your blood pressure twice a day, about 12 hours apart, and record the results. If the reading is >140/90, take a dose of the clonidine. If not, DON"T take it. If you feel dizzy in between the twice a day readings, check your blood pressure and record the results.    IF you received an x-ray today, you will receive an invoice from [facility]. Please contact [facility] at [phone] with questions or concerns regarding your invoice.   IF you received labwork today, you will receive an invoice from [facility]. Please contact [facility] at [phone] with questions or concerns regarding your invoice.   Our billing staff will not be able to assist you with questions regarding bills from these companies.  You will be contacted with the lab results as soon as they are available. The fastest way to get your results is to activate your My Chart account. Instructions are located on the last page of this paperwork. If you have not heard from us regarding the results in 2 weeks, please contact this office.

## 2016-11-22 NOTE — Progress Notes (Signed)
Patient ID: Rita Berry, female    DOB: 30-Jan-1956, 60 y.o.   MRN: [id number]  PCP: Porfirio Oarhelle Deklyn Gibbon, PA-C  Chief Complaint  Patient presents with  . Hospitalization Follow-up    BP, pt fainted yesterday    Subjective:   Presents for evaluation of BP after visit to the ED yesterday following syncope vs near syncope.  EKG was NSR. Labs were remarkable for mildly low serum calcium, but otherwise normal.  160/95 today at home. "I've probably checked it 8 times today." 128/78 this morning.  Had stopped chlorthalidone because it made her dizzy. She was using nitroglycerin patches instead.  Last night, took a 1/4 of a chlorthalidone tablet. Today she has taken 1/8 tablet x 3 doses.  In the ED yesterday 151-175/87-89. 10/29/2016 116/76 09/20/2016 116/78 07/15/2016 108/62   Review of Systems No CP, SOB, headache. Vertigo is intermittent. Not clear if it's worse now or related to her near syncope. No paresthesias. No weakness. No vision change.   Patient Active Problem List   Diagnosis Date Noted  . 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 Osteoarthritis of right hip 06/14/2016  . Essential hypertension 06/12/2015  . Diverticulosis 06/12/2015  . Pain in joint, ankle and foot 09/16/2014  . Bilateral bunions 09/16/2014  . Iliotibial band syndrome of left side 10/30/2013  . Greater trochanteric bursitis of both hips 10/30/2013  . Abnormality of gait 06/18/2013  . Eucalcemic Hyperparathyroidism 04/19/2013  . Osteopenia 04/19/2013  . CIN I (cervical intraepithelial neoplasia I)   . Ovarian cyst      Prior to Admission medications   Medication Sig Start Date End Date Taking? Authorizing Provider  Calcium-Magnesium 500-250 MG TABS Take 1 tablet by mouth 2 (two) times daily.   Yes Historical Provider, MD  chlorthalidone (HYGROTON) 25 MG tablet TAKE 1/2 TO 1 TABLET(12.5 TO 25 MG) BY MOUTH DAILY 07/14/16  Yes Lavena Loretto, PA-C  Cholecalciferol (VITAMIN D) 2000 units CAPS Take 2,000  Units by mouth daily.   Yes Historical Provider, MD  ferrous sulfate 325 (65 FE) MG tablet Take 1 tablet (325 mg total) by mouth 3 (three) times daily after meals. 10/19/16  Yes Lanney GinsMatthew Babish, PA-C  Multiple Vitamin (MULTIVITAMIN) tablet Take 1 tablet by mouth daily. Reported on 06/14/2016   Yes Historical Provider, MD  meclizine (ANTIVERT) 25 MG tablet Take 1 tablet (25 mg total) by mouth 3 (three) times daily as needed for dizziness. Patient not taking: Reported on 11/22/2016 10/29/16   Ethelda ChickKristi M Smith, MD     Allergies  Allergen Reactions  . Codeine Nausea And Vomiting  . Latex Itching       Objective:  Physical Exam  Constitutional: She is oriented to person, place, and time. She appears well-developed and well-nourished. She is active and cooperative. No distress.  BP (!) 154/108 (BP Location: Left Arm, Patient Position: Sitting, Cuff Size: Normal)   Pulse 84   Temp 98.4 F (36.9 C)   Resp 18   SpO2 98%   HENT:  Head: Normocephalic and atraumatic.  Right Ear: Hearing normal.  Left Ear: Hearing normal.  Nose: Nose normal.  Mouth/Throat: Oropharynx is clear and moist. No oropharyngeal exudate.  Eyes: Conjunctivae are normal. No scleral icterus.  Neck: Normal range of motion. Neck supple. No thyromegaly present.  Cardiovascular: Normal rate, regular rhythm and normal heart sounds.   Pulses:      Radial pulses are 2+ on the right side, and 2+ on the left side.  Pulmonary/Chest: Effort normal  and breath sounds normal.  Lymphadenopathy:       Head (right side): No tonsillar, no preauricular, no posterior auricular and no occipital adenopathy present.       Head (left side): No tonsillar, no preauricular, no posterior auricular and no occipital adenopathy present.    She has no cervical adenopathy.       Right: No supraclavicular adenopathy present.       Left: No supraclavicular adenopathy present.  Neurological: She is alert and oriented to person, place, and time. No sensory  deficit.  Skin: Skin is warm, dry and intact. No rash noted. No cyanosis or erythema. Nails show no clubbing.  Psychiatric: She has a normal mood and affect. Her speech is normal and behavior is normal.           Assessment & Plan:   1. Essential hypertension Unclear what has caused the elevated BP. She had tolerated low-dose chlorthalidone for years prior to this summer when she thought it was causing dizziness. I wonder if it wasn't the NTG patches causing the symptom. Reduce home BP checks to BID. If >140/90, she'll take a dose of clonidine. Elect this over chlorthalidone to reduce urinary symptoms at night. If symptoms persist, would request cardiology evaluation. - cloNIDine (CATAPRES) 0.1 MG tablet; Take 1 tablet (0.1 mg total) by mouth 3 (three) times daily.  Dispense: 60 tablet; Refill: 0  Return for re-evaluation in 2-3 weeks.     Fernande Brashelle S. Skylin Kennerson, PA-C Physician Assistant-Certified [facility]

## 2016-11-24 ENCOUNTER — Ambulatory Visit (INDEPENDENT_AMBULATORY_CARE_PROVIDER_SITE_OTHER): Payer: PRIVATE HEALTH INSURANCE | Admitting: Emergency Medicine

## 2016-11-24 ENCOUNTER — Ambulatory Visit (INDEPENDENT_AMBULATORY_CARE_PROVIDER_SITE_OTHER): Payer: PRIVATE HEALTH INSURANCE

## 2016-11-24 VITALS — BP 153/91 | HR 76 | Temp 98.2°F | Resp 16 | Wt 128.2 lb

## 2016-11-24 DIAGNOSIS — J029 Acute pharyngitis, unspecified: Secondary | ICD-10-CM

## 2016-11-24 LAB — POCT RAPID STREP A (OFFICE): RAPID STREP A SCREEN: NEGATIVE

## 2016-11-24 LAB — POCT INFLUENZA A/B
INFLUENZA B, POC: NEGATIVE
Influenza A, POC: NEGATIVE

## 2016-11-24 MED ORDER — FLUTICASONE PROPIONATE 50 MCG/ACT NA SUSP: 2.0000 | Freq: Every day | NASAL | 6 refills | Status: DC

## 2016-11-24 NOTE — Progress Notes (Signed)
By signing my name below, I, [name], attest that this documentation has been prepared under the direction and in the presence of Rita ChrisSteven Daub, MD. Electronically Signed: [name], Scribe. 11/24/2016 , 5:19 PM .  Patient was seen in room 4 .  Chief Complaint:  Chief Complaint  Patient presents with  . Sore Throat    x 3 days  . Facial Pain    HPI: Rita Berry is a 60 y.o. female who [facility] complaining of sore throat with tightness and sinus tenderness with pressure for the past 3 days. She's been having a dry cough. 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 She did receive a flu shot this year. She's taken an extra vitamin C this morning and saline spray at lunch. She denies having seasonal allergies usually.   She was seen here 2 days ago by [name], PA-C for hospital follow up the day prior. She believes it was illness triggered, and causing her BP to spike.   There have also been a lot of URI at her work, but no documented flu. She is aware of 2 staff members with the flu. 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   Past Medical History:  Diagnosis Date  . Arthritis    hip right   . CIN I (cervical intraepithelial neoplasia I)   . Complication of anesthesia   . Humerus distal fracture    left-08-16-16 recent surgery to repair-"no issues"  . Hypertension    not currently taking med due to causing dizziness-MD aware.  . Ovarian cyst   . PONV (postoperative nausea and vomiting)   . Vertigo    "Positional vertigo"- head elevation best   Past Surgical History:  Procedure Laterality Date  . ABDOMINAL HYSTERECTOMY    . ABDOMINAL SURGERY     Ovarian cyst  . COLPOSCOPY    . GYNECOLOGIC CRYOSURGERY    . JOINT REPLACEMENT    . Joint Replacement Right 10/18/2016   RATH  . OOPHORECTOMY     BSO  . ORIF HUMERUS FRACTURE Left 08/16/2016   Procedure:  OPEN TREATMENT OF LEFT DISTAL HUMERUS FRACTURE;  Surgeon: Mack Hookavid Thompson, MD;  Location: [facility];  Service: Orthopedics;  Laterality: Left;  GENERAL ANESTHESIA WITH PRE-OP BLOCK  . TOTAL HIP ARTHROPLASTY Right 10/18/2016   Procedure: RIGHT TOTAL HIP ARTHROPLASTY ANTERIOR APPROACH;  Surgeon: Durene RomansMatthew Olin, MD;  Location: [facility];  Service: Orthopedics;  Laterality: Right;  . TUBAL LIGATION     Social History   Social History  . Marital status: Married    Spouse name: Rita Berry  . Number of children: 0  . Years of education: EdD   Occupational History  . Wellness Art gallery managerDirector     Residents and Staff at [employer]   Social History Main Topics  . Smoking status: Former Smoker    Quit date: 10/10/1990  . Smokeless tobacco: Never Used  . Alcohol use 5.4 oz/week    5 Glasses of wine, 4 Standard drinks or equivalent per week     Comment: social  . Drug use: No  . Sexual activity: Not Currently    Partners: Male   Other Topics Concern  . None   Social History Narrative   Completed Doctor of Education in Hotel managerKinesiology from [employer] 04/2013.   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   Lives with her husband.   Family History  Problem Relation Age of Onset  . Hypertension Mother   . Dementia Mother   . Bradycardia Mother   . Hypertension Father   . Hyperlipidemia Father   . Diabetes Sister     gestational  . Diabetes Maternal Grandmother   . Stroke Maternal Grandmother   . Stroke Maternal Grandfather    Allergies  Allergen Reactions  . Codeine Nausea And Vomiting  . Latex Itching   Prior to Admission medications   Medication Sig Start Date End Date Taking? Authorizing Provider  Calcium-Magnesium 500-250 MG TABS Take 1 tablet by mouth 2 (two) times daily.   Yes Historical Provider, MD  Cholecalciferol (VITAMIN D) 2000 units CAPS Take 2,000 Units by  mouth daily.   Yes Historical Provider, MD  cloNIDine (CATAPRES) 0.1 MG tablet Take 1 tablet (0.1 mg total) by mouth 3 (three) times daily. 11/22/16  Yes Chelle Jeffery, PA-C  ferrous sulfate 325 (65 FE) MG tablet Take 1 tablet (325 mg total) by mouth 3 (three) times daily after meals. 10/19/16  Yes Lanney GinsMatthew Babish, PA-C  Multiple Vitamin (MULTIVITAMIN) tablet Take 1 tablet by mouth daily. Reported on 06/14/2016   Yes Historical Provider, MD  chlorthalidone (HYGROTON) 25 MG tablet TAKE 1/2 TO 1 TABLET(12.5 TO 25 MG) BY MOUTH DAILY Patient not taking: Reported on 11/24/2016 07/14/16   [name], PA-C  meclizine (ANTIVERT) 25 MG tablet Take 1 tablet (25 mg total) by mouth 3 (three) times daily as needed for dizziness. Patient not taking: Reported on 11/24/2016 10/29/16   Ethelda ChickKristi M Smith, MD     ROS:  Constitutional: negative for fever, chills, night sweats, weight changes, or fatigue  HEENT: negative for vision changes, hearing loss, rhinorrhea, epistaxis; positive for sore throat, sinus pain, sinus pressure, nasal congestion Cardiovascular: negative for chest pain or palpitations Respiratory: negative for hemoptysis, wheezing, shortness of breath; positive for cough Abdominal: negative for abdominal pain, nausea, vomiting, diarrhea, or constipation Dermatological: negative for rash Neurologic: negative for headache, dizziness, or syncope All other systems reviewed and are otherwise negative with the exception to those above and in the HPI.  PHYSICAL EXAM: Vitals:   11/24/16 1637  BP: 140/82  Pulse: 79  Resp: 16  Temp: 98.2 F (36.8 C)   Body mass index is 20.38 kg/m.   General: Alert, no acute distress HEENT:  Normocephalic, atraumatic, oropharynx patent; ears are normal, nasal congestion present, throat was normal Eye: [facility] Cardiovascular:  Regular rate and rhythm, no rubs murmurs or gallops.  No Carotid bruits, radial pulse intact. No pedal edema.  Respiratory: Clear to  auscultation bilaterally.  No wheezes, rales, or rhonchi.  No cyanosis, no use of accessory musculature Abdominal: No organomegaly, abdomen is soft and non-tender, positive bowel sounds. No masses. Musculoskeletal: Gait intact. No edema, tenderness Skin: No rashes. Neurologic: Facial musculature symmetric. Psychiatric: Patient acts appropriately throughout our interaction.  Lymphatic: No cervical or submandibular lymphadenopathy Genitourinary/Anorectal: No acute findings  LABS: Results for orders placed or performed in visit on 11/24/16  POCT Influenza A/B  Result Value Ref Range   Influenza A, POC Negative Negative   Influenza B, POC Negative Negative  POCT rapid strep A  Result Value Ref Range   Rapid Strep A Screen Negative Negative    EKG/XRAY:   Ct Head Wo Contrast  Result Date: 11/21/2016 CLINICAL DATA:  Near syncope.  Headache.  History of hypertension. EXAM: CT HEAD WITHOUT CONTRAST TECHNIQUE: Contiguous axial images were obtained from the base of the skull through the vertex without intravenous contrast. COMPARISON:  None. FINDINGS: Brain: No evidence of acute infarction, hemorrhage, hydrocephalus, extra-axial collection or mass lesion/mass effect. Vascular: No hyperdense vessel or unexpected calcification. Skull: Normal. Negative for fracture or focal lesion. Sinuses/Orbits: Visualize globes orbits are unremarkable. Visualized sinuses and mastoid air cells are clear. Other: None. IMPRESSION: Normal unenhanced CT scan the brain. Electronically Signed   By: Amie Portland M.D.   On: 11/21/2016 18:31   My view of her sinus one view was negative. She did have a previous CT which did not show sinusitis.  Dg Sinus 1-2 Views  Result Date: 11/24/2016 CLINICAL DATA:  Sore throat with tightness and sinus tenderness. EXAM: PARANASAL SINUSES - 1-2 VIEW COMPARISON:  CT 11/21/2016 FINDINGS: The paranasal sinus are aerated. There is no evidence of sinus opacification air-fluid levels or  mucosal thickening. No significant bone abnormalities are seen. IMPRESSION: Negative. Electronically Signed   By: Richarda Overlie M.D.   On: 11/24/2016 17:40   ASSESSMENT/PLAN: I advised her to use some Flonase nasal spray along with her saline spray. Will hold off on antibiotics at present time in the absence of clear signs of sinusitis. She will continue her current blood pressure medications.   Gross sideeffects, risk and benefits, and alternatives of medications d/w patient. Patient is aware that all medications have potential sideeffects and we are unable to predict every sideeffect or drug-drug interaction that may occur.  Rita Chris MD 11/24/2016 4:57 PM

## 2016-11-24 NOTE — Patient Instructions (Signed)
     IF you received an x-ray today, you will receive an invoice from [facility]. Please contact [facility] at [phone] with questions or concerns regarding your invoice.   IF you received labwork today, you will receive an invoice from [facility]. Please contact [facility] at [phone] with questions or concerns regarding your invoice.   Our billing staff will not be able to assist you with questions regarding bills from these companies.  You will be contacted with the lab results as soon as they are available. The fastest way to get your results is to activate your My Chart account. Instructions are located on the last page of this paperwork. If you have not heard from us regarding the results in 2 weeks, please contact this office.      

## 2016-11-27 LAB — CULTURE, GROUP A STREP: STREP A CULTURE: NEGATIVE

## 2016-11-29 DIAGNOSIS — IMO0002 Reserved for concepts with insufficient information to code with codable children: Secondary | ICD-10-CM | POA: Insufficient documentation

## 2016-12-01 ENCOUNTER — Encounter: Payer: Self-pay | Admitting: Physician Assistant

## 2016-12-01 DIAGNOSIS — I10 Essential (primary) hypertension: Secondary | ICD-10-CM

## 2016-12-02 NOTE — Telephone Encounter (Signed)
Pt calling about Clonidine making her dizzy its hard for her to work with the dizziness she states that she only took a quarter of the pill today and she hasn't has had and symptoms today she says that she still have come Chlorthalidone that she can take if you want her to please respond to patient by phone or email

## 2016-12-05 ENCOUNTER — Encounter: Payer: PRIVATE HEALTH INSURANCE | Admitting: Sports Medicine

## 2016-12-06 ENCOUNTER — Encounter: Payer: Self-pay | Admitting: Physician Assistant

## 2016-12-06 ENCOUNTER — Ambulatory Visit (INDEPENDENT_AMBULATORY_CARE_PROVIDER_SITE_OTHER): Payer: PRIVATE HEALTH INSURANCE | Admitting: Physician Assistant

## 2016-12-06 DIAGNOSIS — I10 Essential (primary) hypertension: Secondary | ICD-10-CM | POA: Diagnosis not present

## 2016-12-06 MED ORDER — CLONIDINE HCL 0.1 MG PO TABS: 0.0500 mg | ORAL_TABLET | ORAL | 3 refills | Status: DC | PRN

## 2016-12-06 MED ORDER — CHLORTHALIDONE 25 MG PO TABS: 12.5000 mg | ORAL_TABLET | Freq: Every day | ORAL | 3 refills | Status: DC

## 2016-12-06 NOTE — Progress Notes (Signed)
Cardiology Office Note   Date:  12/06/2016   ID:  Rita Berry, DOB 1956/02/27, MRN [id number]  PCP:  Porfirio Oar, PA-C  Cardiologist:  New, Dr [place]  Tyton Abdallah, PA-C   Chief Complaint  Patient presents with  . New Patient (Initial Visit)  . Dizziness    vertigo    History of Present Illness: Rita Berry is a 60 y.o. female with a history of HTN, CIN, ovarian cyst, OA.  Admit 10/31-11/01 for R-THR, doing well. 11/11, seen for vertigo, felt BPPV 12/04, ER visit for near-syncope and vertigo, BP elevated 175/88 12/05, UC visit for dizziness, HTN, felt chlorthalidone made her dizzy but was also on NTG patch. Clonidine rx given. 12/07 UC visit for URI sx, felt viral, BP 140/82 12/15, phone call for dizziness pt felt 2nd clonidine  Rita Berry presents for cardiology evaluation.   She had been on chlorthalidone for years, had no side effects and good BP control. She was started on a NTG patch to increase perfusion to her hip prior to the replacement. Dizzy>>chlorthalidone stopped.  She had the surgery, patch stopped. BP up>>1/2 NTG patch added, BP dropped too low, patch d/c'd Small piece of patch started for BP control.  When she started back to work, she got dizzy. Patch removed. 12/04 BP elevated at work>>ER visit for HTN, no rx started. 12/05 Office visit for HTN>>clonidine started, at 0.1 mg tid, BP control better but side effects make her feel bad. 12/18, BP spiked up again and pt took (over several hours) 3/4 of a 0.1 mg tablet. She called EMS and took another tablet, began feeling bad. 12/19, pt took 1/2 clonidine to avoid a BP spike.  She has dizziness with low BP. She has a funny feeling in her head, headaches with high BP. She will feel like she is going to pass out and will sit down.  She teaches multiple activity classes at work, no chest pain with exertion. No new DOE. Started back full force after her surgery, wonders if she did too much too soon.       Past Medical History:  Diagnosis Date  . Arthritis    hip right   . CIN I (cervical intraepithelial neoplasia I)   . Complication of anesthesia   . Humerus distal fracture    left-08-16-16 recent surgery to repair-"no issues"  . Hypertension    not currently taking med due to causing dizziness-MD aware.  . Ovarian cyst   . PONV (postoperative nausea and vomiting)   . Vertigo    "Positional vertigo"- head elevation best    Past Surgical History:  Procedure Laterality Date  . ABDOMINAL HYSTERECTOMY    . ABDOMINAL SURGERY     Ovarian cyst  . COLPOSCOPY    . GYNECOLOGIC CRYOSURGERY    . JOINT REPLACEMENT    . Joint Replacement Right 10/18/2016   RATH  . OOPHORECTOMY     BSO  . ORIF HUMERUS FRACTURE Left 08/16/2016   Procedure: OPEN TREATMENT OF LEFT DISTAL HUMERUS FRACTURE;  Surgeon: Mack Hook, MD;  Location: [facility];  Service: Orthopedics;  Laterality: Left;  GENERAL ANESTHESIA WITH PRE-OP BLOCK  . TOTAL HIP ARTHROPLASTY Right 10/18/2016   Procedure: RIGHT TOTAL HIP ARTHROPLASTY ANTERIOR APPROACH;  Surgeon: Durene Romans, MD;  Location: [facility];  Service: Orthopedics;  Laterality: Right;  . TUBAL LIGATION      Current Outpatient Prescriptions  Medication Sig Dispense Refill  . Calcium-Magnesium 500-250 MG  TABS Take 1 tablet by mouth 2 (two) times daily.    . Cholecalciferol (VITAMIN D) 2000 units CAPS Take 2,000 Units by mouth daily.    . cloNIDine (CATAPRES) 0.1 MG tablet Take 1 tablet (0.1 mg total) by mouth 3 (three) times daily. 60 tablet 0  . ferrous sulfate 325 (65 FE) MG tablet Take 1 tablet (325 mg total) by mouth 3 (three) times daily after meals.  3  . fluticasone (FLONASE) 50 MCG/ACT nasal spray Place 2 sprays into both nostrils daily. 16 g 6  . Multiple Vitamin (MULTIVITAMIN) tablet Take 1 tablet by mouth daily. Reported on 06/14/2016     No current facility-administered medications for this visit.     Allergies:   Codeine and Latex     Social History:  The patient  reports that she quit smoking about 26 years ago. She has never used smokeless tobacco. She reports that she drinks about 5.4 oz of alcohol per week . She reports that she does not use drugs.   Family History:  The patient's family history includes Bradycardia in her mother; Dementia in her mother; Diabetes in her maternal grandmother and sister; Hyperlipidemia in her father; Hypertension in her father and mother; Stroke in her maternal grandfather and maternal grandmother.    ROS:  Please see the history of present illness. All other systems are reviewed and negative.    PHYSICAL EXAM: VS:  BP 116/74   Pulse 82   Ht 5\' 7"  (1.702 m)   Wt 126 lb (57.2 kg)   BMI 19.73 kg/m  , BMI Body mass index is 19.73 kg/m. GEN: Well nourished, well developed, female in no acute distress  HEENT: normal for age  Neck: no JVD, no carotid bruit, no masses Cardiac: RRR; very soft murmur, no rubs, or gallops Respiratory:  clear to auscultation bilaterally, normal work of breathing GI: soft, nontender, nondistended, + BS MS: no deformity or atrophy; no edema; distal pulses are 2+ in all 4 extremities   Skin: warm and dry, no rash Neuro:  Strength and sensation are intact Psych: euthymic mood, full affect   EKG:  EKG is not ordered today. The ECG performed 12/18 by EMS demonstrates SR, normal intervals, no acute changes. 12/04 ER ECG reviewed, no change   Recent Labs: 10/29/2016: ALT 12 11/21/2016: BUN 11; Creatinine, Ser 0.62; Hemoglobin 13.2; Platelets 233; Potassium 4.1; Sodium 139    Lipid Panel    Component Value Date/Time   CHOL 225 (H) 06/14/2016 1651   TRIG 87 06/14/2016 1651   HDL 90 06/14/2016 1651   CHOLHDL 2.5 06/14/2016 1651   VLDL 17 06/14/2016 1651   LDLCALC 118 06/14/2016 1651     Wt Readings from Last 3 Encounters:  12/06/16 126 lb (57.2 kg)  11/24/16 128 lb 3.2 oz (58.2 kg)  10/29/16 131 lb (59.4 kg)     Other studies  Reviewed: Additional studies/ records that were reviewed today include: office notes, Epic records.  ASSESSMENT AND PLAN: Pt discussed with Dr SwazilandJordan  1.  HTN: Pt had tolerated the chlorthalidone for years, but had side effects with the chlorthalidone while on NTG patch. NTG patch no longer needed and has been d/c'd.   Will restart chlorthalidone at 1/2 tablet daily. Ok to take 1/2 clonidine tablet prn for SBP> 180 or DBP >100. Needs to take rx consistently for a week to determine how she tolerates it and if it is effective.  Can take instead 1/2 tab clonidine tid scheduled, which  she will do today since she already took it this am.  If the chlorthalidone and the clonidine are not adequate or have significant side effects, try Norvasc.  Pt otherwise stable from a cardiac standpoint, needs to stay hydrated.     Current medicines are reviewed at length with the patient today.  The patient has concerns regarding medicines. Concerns were addressed  The following changes have been made:  Restart chlorthalidone  Labs/ tests ordered today include:  No orders of the defined types were placed in this encounter.    Disposition:   FU with Dr SwazilandJordan  Signed, Theodore DemarkBarrett, Fletcher Ostermiller, PA-C  12/06/2016 9:12 AM    [facility] Phone: [phone]; Fax: [phone]  This note was written with the assistance of speech recognition software. Please excuse any transcriptional errors.

## 2016-12-06 NOTE — Patient Instructions (Addendum)
Medication Instructions:  Your physician has recommended you make the following change in your medication:  1- Take Chlorthalidone 12.5 mg (1/2 tablet) by mouth daily 2- Take Clonidine 1/2 tablet by mouth as needed for SBP greater than 180 or DBP greater than 100.  Lab work: NONE  Testing/Procedures: NONE  Follow-Up: Your physician wants you to follow-up in: 3 months with Dr. SwazilandJordan.   Your physician wants you to call our office, and schedule an appointment with Theodore Demarkhonda Barrett PA if Chlorthalidone is not adequate. [phone]   If you need a refill on your cardiac medications before your next appointment, please call your pharmacy.

## 2016-12-07 ENCOUNTER — Ambulatory Visit (INDEPENDENT_AMBULATORY_CARE_PROVIDER_SITE_OTHER): Payer: PRIVATE HEALTH INSURANCE | Admitting: Sports Medicine

## 2016-12-07 VITALS — BP 120/66 | Ht 67.0 in | Wt 124.0 lb

## 2016-12-07 DIAGNOSIS — M25572 Pain in left ankle and joints of left foot: Secondary | ICD-10-CM | POA: Diagnosis not present

## 2016-12-07 NOTE — Progress Notes (Signed)
Patient ID: Rita Berry, female   DOB: 09/06/1956, 60 y.o.   MRN: [id number]   Patient comes in today for new custom orthotics. Her last set of orthotics were constructed on 12/30/2014. She has a history of plantar fasciitis. Her foot pain has started to return. It is not as severe as it was. She thinks a new pair of orthotics would be helpful.  Custom orthotics were constructed today. We used a pair of orthotics with smart cell technology. She found them to be comfortable prior to leaving the office. She will follow-up with us as needed.  Total time spent with the patient was 30 minutes with greater than 50% of the time spent in face-to-face consultation discussing orthotic construction, instruction, and fitting. Patient was ambulating with a neutral gait and without a limp when leaving the office.  Patient was fitted for a : standard, cushioned, semi-rigid orthotic. The orthotic was heated and afterward the patient stood on the orthotic blank positioned on the orthotic stand. The patient was positioned in subtalar neutral position and 10 degrees of ankle dorsiflexion in a weight bearing stance. After completion of molding, a stable base was applied to the orthotic blank. The blank was ground to a stable position for weight bearing. Size: 9 (Smart Cell) Base: none Posting: none Additional orthotic padding: none

## 2016-12-08 ENCOUNTER — Encounter: Payer: Self-pay | Admitting: Physician Assistant

## 2016-12-09 ENCOUNTER — Encounter: Payer: Self-pay | Admitting: Diagnostic Neuroimaging

## 2016-12-09 ENCOUNTER — Ambulatory Visit (INDEPENDENT_AMBULATORY_CARE_PROVIDER_SITE_OTHER): Payer: PRIVATE HEALTH INSURANCE | Admitting: Diagnostic Neuroimaging

## 2016-12-09 ENCOUNTER — Other Ambulatory Visit: Payer: Self-pay | Admitting: Physician Assistant

## 2016-12-09 ENCOUNTER — Ambulatory Visit (INDEPENDENT_AMBULATORY_CARE_PROVIDER_SITE_OTHER): Payer: PRIVATE HEALTH INSURANCE | Admitting: Physician Assistant

## 2016-12-09 VITALS — BP 140/90 | HR 74 | Temp 97.7°F | Resp 18 | Ht 67.0 in | Wt 124.0 lb

## 2016-12-09 VITALS — BP 129/78 | HR 72 | Ht 67.0 in | Wt 126.6 lb

## 2016-12-09 DIAGNOSIS — H8111 Benign paroxysmal vertigo, right ear: Secondary | ICD-10-CM

## 2016-12-09 DIAGNOSIS — R42 Dizziness and giddiness: Secondary | ICD-10-CM

## 2016-12-09 DIAGNOSIS — I10 Essential (primary) hypertension: Secondary | ICD-10-CM

## 2016-12-09 DIAGNOSIS — H9313 Tinnitus, bilateral: Secondary | ICD-10-CM | POA: Diagnosis not present

## 2016-12-09 DIAGNOSIS — IMO0002 Reserved for concepts with insufficient information to code with codable children: Secondary | ICD-10-CM

## 2016-12-09 LAB — POCT SEDIMENTATION RATE: POCT SED RATE: 20 mm/h (ref 0–22)

## 2016-12-09 MED ORDER — AMLODIPINE BESYLATE 2.5 MG PO TABS: 2.5000 mg | ORAL_TABLET | Freq: Every day | ORAL | 0 refills | Status: DC

## 2016-12-09 MED ORDER — ALPRAZOLAM 0.5 MG PO TABS: 0.5000 mg | ORAL_TABLET | ORAL | 0 refills | Status: DC | PRN
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
## 2016-12-09 NOTE — Telephone Encounter (Signed)
Saw the primary care note from today (I had not seen it before I answered the email) and called pt.  Did not want her to feel unsure what she should do.  Explained that she is better off if PCP manages her BP. We are thinking along the same lines.  Discussed the role that anxiety could play in her BP being high.  Pt receptive to the concept and is trying biofeedback, relaxation, to help keep her BP down.  She is agreeable to following PCP instructions on the amlodipine.  No further questions or concerns.  Leanna BattlesBarrett, Rhonda, PA-C 12/09/2016 8:49 PM Beeper [phone]

## 2016-12-09 NOTE — Patient Instructions (Addendum)
Thank you for coming to see [place] at [facility]. I hope we have been able to provide you high quality care today.  You may receive a patient satisfaction survey over the next few weeks. We would appreciate your feedback and comments so that we may continue to improve ourselves and the health of our patients.  - I will check MRI brain and MRA head/neck   ~~~~~~~~~~~~~~~~~~~~~~~~~~~~~~~~~~~~~~~~~~~~~~~~~~~~~~~~~~~~~~~~~  DR. Tedrick Port'S GUIDE TO HAPPY AND HEALTHY LIVING These are some of my general health and wellness recommendations. Some of them may apply to you better than others. Please use common sense as you try these suggestions and feel free to ask me any questions.   ACTIVITY/FITNESS Mental, social, emotional and physical stimulation are very important for brain and body health. Try learning a new activity (arts, music, language, sports, games).  Keep moving your body to the best of your abilities. You can do this at home, inside or outside, the park, community center, gym or anywhere you like. Consider a physical therapist or personal trainer to get started. Consider the app Sworkit. Fitness trackers such as smart-watches, smart-phones or Fitbits can help as well.   NUTRITION Eat more plants: colorful vegetables, nuts, seeds and berries.  Eat less sugar, salt, preservatives and processed foods.  Avoid toxins such as cigarettes and alcohol.  Drink water when you are thirsty. Warm water with a slice of lemon is an excellent morning drink to start the day.  Consider these websites for more information The Nutrition Source (https://www.henry-hernandez.biz/) Precision Nutrition (WindowBlog.ch)   RELAXATION Consider practicing mindfulness meditation or other relaxation techniques such as deep breathing, prayer, yoga, tai chi, massage. See website mindful.org or the apps Headspace or Calm to help get  started.   SLEEP Try to get at least 7-8+ hours sleep per day. Regular exercise and reduced caffeine will help you sleep better. Practice good sleep hygeine techniques. See website sleep.org for more information.   PLANNING Prepare estate planning, living will, healthcare POA documents. Sometimes this is best planned with the help of an attorney. Theconversationproject.org and agingwithdignity.org are excellent resources.

## 2016-12-09 NOTE — Progress Notes (Signed)
12/09/2016 5:44 PM   DOB: 1956/11/06 / MRN: [id number]  SUBJECTIVE:  Rita Berry is a 60 y.o. female presenting for dizziness. The symptoms wax and wane.  She has seen ENT.  [facility] and had a head CT which was normal. She has also seen cardiology and there was no concern, and was advised to chlorthalidone 1/2 tab.  She has taken this 4 days in a row but did not take this today.  She tells me that this all started just before hip surgery in  October when she was given a nitro patch to help with hip pain.  Says that her BP was so good on the nitro patch that she no longer needed the chlorthalidone and so she stopped this.  Since stopping the nitro patch she has been taking chlorthalidone sporadically and relying mostly on hydralazine to keep her BP under control.  Tells me "it does a great for 6 hours."  She feels the clonidine has been the most helpful medication thus far but it does make her "loopy."    She is allergic to codeine and latex.   She  has a past medical history of Arthritis; CIN I (cervical intraepithelial neoplasia I); Complication of anesthesia; Humerus distal fracture; Hypertension; Ovarian cyst; PONV (postoperative nausea and vomiting); and Vertigo.    She  reports that she quit smoking about 26 years ago. She has never used smokeless tobacco. She reports that she drinks about 5.4 oz of alcohol per week . She reports that she does not use drugs. She  reports that she does not currently engage in sexual activity but has had female partners.; The patient  has a past surgical history that includes Gynecologic cryosurgery; Colposcopy; Abdominal hysterectomy; Abdominal surgery; Oophorectomy; Tubal ligation; ORIF humerus fracture (Left, 08/16/2016); Joint replacement; Joint Replacement (Right, 10/18/2016); and Total hip arthroplasty (Right, 10/18/2016).  Her family history includes Bradycardia in her mother; Dementia in her mother; Diabetes in her  maternal grandmother and sister; Hyperlipidemia in her father; Hypertension in her father and mother; Stroke in her maternal grandfather and maternal grandmother.  Review of Systems  Constitutional: Negative for chills and fever.  Eyes: Negative for blurred vision.  Cardiovascular: Negative for chest pain.  Gastrointestinal: Negative for nausea.  Skin: Negative for itching and rash.  Neurological: Positive for dizziness. Negative for headaches.    The problem list and medications were reviewed and updated by myself where necessary and exist elsewhere in the encounter.   OBJECTIVE:  BP 132/80 (BP Location: Right Arm, Patient Position: Sitting, Cuff Size: Small)   Pulse 74   Temp 97.7 F (36.5 C) (Oral)   Resp 18   Ht 5\' 7"  (1.702 m)   Wt 124 lb (56.2 kg)   SpO2 99%   BMI 19.42 kg/m   Orthostatic VS for the past 24 hrs:  BP- Lying Pulse- Lying BP- Sitting Pulse- Sitting BP- Standing at 0 minutes Pulse- Standing at 0 minutes  12/09/16 1721 (!) 175/91 73 (!) 185/98 75 (!) 169/98 79       Physical Exam  Constitutional: She is oriented to person, place, and time.  Cardiovascular: Normal rate and regular rhythm.   Pulmonary/Chest: Effort normal and breath sounds normal.  Musculoskeletal: Normal range of motion.  Neurological: She is alert and oriented to person, place, and time. She displays normal reflexes. No cranial nerve deficit. She exhibits normal muscle tone. Coordination normal.  Skin: Skin is warm and dry.  Lab Results  Component Value Date   TSH 1.892 06/12/2015   Lab Results  Component Value Date   NA 139 11/21/2016   K 4.1 11/21/2016   CL 106 11/21/2016   CO2 26 11/21/2016    No results found for this or any previous visit (from the past 72 hour(s)).  No results found.  ASSESSMENT AND PLAN  [name] was seen today for dizziness and hypertension.  Diagnoses and all orders for this visit:  Dizziness -     POCT SEDIMENTATION RATE -     TSH -      Orthostatic vital signs  Essential hypertension Comments: She had a clonidine 7 hours ago and her symptoms returned. (see orthostatics) She will start back on her chlorthalidone and I am starting her on a low dose norvasc as it dose seem that the HTN is driving her dizziness.  It does seem that the problem is stemming from fasting acting antihypertensives, initially from nitro patch as Ms. Leotis ShamesJeffery alludes to in her note from 12/15, and later possibly from clonidine, which I do think was an excellent choice given there may be an anxiety component here.  I have advised her to be consistent with her medications at this point and avoid taking more than 1/2 a clonidine.    -     amLODipine (NORVASC) 2.5 MG tablet; Take 1 tablet (2.5 mg total) by mouth daily.    The patient is advised to call or return to clinic if she does not see an improvement in symptoms, or to seek the care of the closest emergency department if she worsens with the above plan.   Deliah BostonMichael Krishon Adkison, MHS, PA-C [facility] 12/09/2016 5:44 PM

## 2016-12-09 NOTE — Patient Instructions (Addendum)
I think it is important to try and limit medications that quickly quickly lower your blood pressure and may create a rebound hypertension.  Go back on your chlorthalidone. Lets start a low dose of norvasc    IF you received an x-ray today, you will receive an invoice from [facility]. Please contact [facility] at [phone] with questions or concerns regarding your invoice.   IF you received labwork today, you will receive an invoice from [facility]. Please contact [facility] at [phone] with questions or concerns regarding your invoice.   Our billing staff will not be able to assist you with questions regarding bills from these companies.  You will be contacted with the lab results as soon as they are available. The fastest way to get your results is to activate your My Chart account. Instructions are located on the last page of this paperwork. If you have not heard from us regarding the results in 2 weeks, please contact this office.

## 2016-12-10 LAB — TSH: TSH: 3.06 u[IU]/mL (ref 0.450–4.500)

## 2016-12-14 ENCOUNTER — Telehealth: Payer: Self-pay | Admitting: *Deleted

## 2016-12-14 NOTE — Telephone Encounter (Signed)
Pt is asking if she can have the mri this week at anyplace that will fit her in?  [phone]

## 2016-12-14 NOTE — Telephone Encounter (Signed)
I spoke with patient and informed her the order was sent to [facility] and she could call them at anytime to schedule and she understood.

## 2016-12-17 ENCOUNTER — Encounter: Payer: Self-pay | Admitting: Physician Assistant

## 2016-12-21 ENCOUNTER — Telehealth: Payer: Self-pay | Admitting: Cardiology

## 2016-12-21 DIAGNOSIS — R42 Dizziness and giddiness: Secondary | ICD-10-CM

## 2016-12-21 DIAGNOSIS — I10 Essential (primary) hypertension: Secondary | ICD-10-CM

## 2016-12-21 NOTE — Telephone Encounter (Signed)
New Message  Pt c/o medication issue:  1. Name of Medication:  amlodipine (norvasc) 2.5 mg tablet pt voiced she stopped taking it due to her bp dropping so low Clonidine (Captapres) .1 mg tablet  2. How are you currently taking this medication (dosage and times per day)? See above  3. Are you having a reaction (difficulty breathing--STAT)? N/A  4. What is your medication issue? Pt voiced she stopped taking amlodipine due to her BP dropping so low.  Please f/u with pt

## 2016-12-21 NOTE — Telephone Encounter (Signed)
Patient would like to "have her life back where she didn't have to wonder about her BP" She would like to get back on chlorthalidone - was taking til Oct but then developed dizziness so this was stopped  She states clonidine makes her dizzy, ears ringing She doesn't know how to wean off clonidine without having rebound HTN  Patient states she stopped Norvasc - last dose on Saturday She states her HR is low at night - in the 40s - feels like she will pass out but has not - On Saturday, she was working on a tractor at her parent's house and had to lie flat on the ground - out of energy, couldn't walk - On Sunday, she had similar episode while eating breakfast - had to lie on the ground She states her BP was fine each episode  Patient states she would be glad to come in for appt - scheduled an appt for 1/4 with Bjorn Loserhonda, [place] but patient would also like PA advice as well

## 2016-12-21 NOTE — Telephone Encounter (Signed)
Please let her know ok to stop the clonidine, dose is too low to cause rebound. With variable HR, possible sx, she needs to wear an event monitor. Can you order and get her in to get it put on? 2 weeks should be enough. Then f/u with me or MD. Thanks

## 2016-12-21 NOTE — Telephone Encounter (Signed)
Returned call to patient. She states she came home from work and checked BP - 170/98  She took half clonidine She does not feel comfortable stopping clonidine as she has already stopped amlodipine on her own She wants to go back on chlorthalidone  She agrees to wear heart monitor - order placed and message sent to scheduler to arrange appt and follow up  Will route to [place]onda, [place] to advise on BP Patient wishes to cancel her 1/4 appt previously scheduled.

## 2016-12-22 ENCOUNTER — Ambulatory Visit: Payer: PRIVATE HEALTH INSURANCE | Admitting: Physician Assistant

## 2016-12-22 NOTE — Telephone Encounter (Signed)
OK to continue the clonidine since it is helping her.

## 2016-12-22 NOTE — Telephone Encounter (Signed)
Spoke to patient Does not want to continue clonidine clarifies that she would actually like to be able to just try chlorthalidone again. notes she stopped it prior to hip replacement due to dizziness x2 days. "now in hindsight", considers it might be something else that was at cause for this (notes at that time preceding surgery, she had been on nitro patches prescribed by ortho) feels like she messed up and never should have stopped chlorthalidone because she was doing well on it.  I spoke to [place] who advised OK to resume the chlorthalidone at 1/2 tablet (previous dose) & hold clonidine. Advised pt to track BP trends for several days, return call if not helping or if she feels she will need additional medication. Recommended she keep clonidine on-hand but don't take unless she has elevations as noted. She will hold for now and contact us if she needs to get recommendation, or if she needs refill on the chlorthalidone.  Pt appreciative of help. Voiced understanding of instructions.

## 2016-12-22 NOTE — Telephone Encounter (Signed)
Rita Berry is calling because she would like to know what other medications she could take or what else she could try once she stops clonidine. She is not comfortable stopping the clonidine. Please call. Thanks.

## 2016-12-23 ENCOUNTER — Ambulatory Visit: Payer: PRIVATE HEALTH INSURANCE | Admitting: Cardiology

## 2016-12-23 ENCOUNTER — Telehealth: Payer: Self-pay | Admitting: Cardiology

## 2016-12-23 NOTE — Telephone Encounter (Signed)
Closed encounter °

## 2016-12-26 ENCOUNTER — Encounter: Payer: PRIVATE HEALTH INSURANCE | Admitting: Sports Medicine

## 2016-12-27 ENCOUNTER — Ambulatory Visit
Admission: RE | Admit: 2016-12-27 | Discharge: 2016-12-27 | Disposition: A | Discharge status: Home / Self Care | Payer: PRIVATE HEALTH INSURANCE | Source: Ambulatory Visit | Attending: Diagnostic Neuroimaging | Admitting: Diagnostic Neuroimaging

## 2016-12-27 DIAGNOSIS — R42 Dizziness and giddiness: Secondary | ICD-10-CM

## 2016-12-27 DIAGNOSIS — H9313 Tinnitus, bilateral: Secondary | ICD-10-CM

## 2016-12-27 DIAGNOSIS — IMO0002 Reserved for concepts with insufficient information to code with codable children: Secondary | ICD-10-CM

## 2016-12-27 MED ORDER — GADOBENATE DIMEGLUMINE 529 MG/ML IV SOLN
10.0000 mL | Freq: Once | INTRAVENOUS | Status: AC | PRN
  Administered 2016-12-27: 10 mL via INTRAVENOUS

## 2016-12-28 ENCOUNTER — Ambulatory Visit (INDEPENDENT_AMBULATORY_CARE_PROVIDER_SITE_OTHER): Payer: PRIVATE HEALTH INSURANCE

## 2016-12-28 DIAGNOSIS — R42 Dizziness and giddiness: Secondary | ICD-10-CM | POA: Diagnosis not present

## 2017-01-02 ENCOUNTER — Other Ambulatory Visit: Payer: Self-pay | Admitting: Physician Assistant

## 2017-01-04 ENCOUNTER — Encounter: Payer: Self-pay | Admitting: Physician Assistant

## 2017-01-06 ENCOUNTER — Telehealth: Payer: Self-pay | Admitting: *Deleted

## 2017-01-06 ENCOUNTER — Other Ambulatory Visit: Payer: Self-pay | Admitting: Student

## 2017-01-06 NOTE — Telephone Encounter (Signed)
Spoke with patient who stated she saw both reports on my chart and had no questions. Confirmed her FU in March. She verbalized understanding, appreciation of call.

## 2017-01-07 ENCOUNTER — Other Ambulatory Visit: Payer: Self-pay | Admitting: Student

## 2017-01-07 ENCOUNTER — Telehealth: Payer: Self-pay | Admitting: Student

## 2017-01-07 MED ORDER — LISINOPRIL 5 MG PO TABS: 5.0000 mg | ORAL_TABLET | Freq: Every day | ORAL | 6 refills | Status: DC

## 2017-01-07 NOTE — Telephone Encounter (Signed)
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 Had increased Chlorthalidone from 12.5mg  daily to 25mg  daily but found she was still having to take Clonidine due to spikes in her BP, which made her feel awful.   Tried Amlodipine but this dropped her HR and she experienced lightheadedness and dizziness throughout the day.   While increased Chlorthalidone is helping with BP, she is urinating very frequently and look like to go back to the half-tablet. With her HR dropping with Amlodipine, would try to avoid BB therapy. Will add low-dose Lisinopril 5mg  daily and titrate as needed if she is able to tolerate this.  Will continue to try to avoid using Clonidine due to the rebound effects and go back to Chlorthalidone 12.5 mg daily.   Signed, Ellsworth LennoxBrittany M Dejane Scheibe, PA-C 01/07/2017, 9:36 AM

## 2017-01-13 ENCOUNTER — Telehealth: Payer: Self-pay | Admitting: Physician Assistant

## 2017-01-13 ENCOUNTER — Ambulatory Visit (INDEPENDENT_AMBULATORY_CARE_PROVIDER_SITE_OTHER): Payer: PRIVATE HEALTH INSURANCE | Admitting: Physician Assistant

## 2017-01-13 ENCOUNTER — Encounter: Payer: Self-pay | Admitting: Physician Assistant

## 2017-01-13 VITALS — BP 120/72 | HR 90 | Ht 67.0 in | Wt 121.2 lb

## 2017-01-13 DIAGNOSIS — I10 Essential (primary) hypertension: Secondary | ICD-10-CM

## 2017-01-13 DIAGNOSIS — R42 Dizziness and giddiness: Secondary | ICD-10-CM

## 2017-01-13 DIAGNOSIS — R002 Palpitations: Secondary | ICD-10-CM | POA: Diagnosis not present

## 2017-01-13 DIAGNOSIS — Z79899 Other long term (current) drug therapy: Secondary | ICD-10-CM | POA: Diagnosis not present

## 2017-01-13 MED ORDER — LISINOPRIL 5 MG PO TABS: 5.0000 mg | ORAL_TABLET | Freq: Two times a day (BID) | ORAL | 6 refills | Status: DC

## 2017-01-13 NOTE — Patient Instructions (Addendum)
Medication Instructions:  INCREASE lisinopril to 5mg  (1 tablet) to two times a day.  Take 2nd dose at lunch time.    CONTINUE chlorthalidone.   Labwork: Have lab work IN [employer] WEEK.  (BMET)  Testing/Procedures: NONE  Follow-Up: Your physician wants you to follow-up in: 1 YEAR WITH DR. SwazilandJORDAN. You will receive a reminder letter in the mail two months in advance. If you don't receive a letter, please call our office to schedule the follow-up appointment.   Any Other Special Instructions Will Be Listed Below (If Applicable).     If you need a refill on your cardiac medications before your next appointment, please call your pharmacy.

## 2017-01-13 NOTE — Telephone Encounter (Signed)
Spoke with pt, aware rhonda said it was okay for a glass of wine daily.

## 2017-01-13 NOTE — Telephone Encounter (Signed)
New message   Pt verbalized that she is calling for permission to hav a glass of wine daily

## 2017-01-13 NOTE — Progress Notes (Signed)
Cardiology Office Note   Date:  01/13/2017   ID:  Rita Berry, DOB [birth year], MRN [id number]  PCP:  Porfirio Oar, PA-C  Cardiologist:  New to Dr. Swaziland at office visit 12/06/2016  Rita Berry, Rita Berry 12/06/2018  Chief Complaint  Patient presents with  . Follow-up    event monitor, blood pressure spikes    History of Present Illness: Rita Berry is a 61 y.o. female with a history of  HTN, CIN, ovarian cyst, OA  11/11, seen for vertigo, felt BPPV 12/04, ER visit for near-syncope and vertigo, BP elevated 175/88 12/05, UC visit for dizziness, HTN, felt chlorthalidone made her dizzy, also on NTG patch. Clonidine rx given. 12/07 UC visit for URI sx, felt viral, BP 140/82 12/15, phone call for dizziness pt felt 2nd clonidine 12/19 office visit and chlorthalidone restarted at one half tablet daily, okay to take one half clonidine when necessary, amlodipine Multiple phone notes since then, last one in 01/20. Chlorthalidone up to 25 mg daily which helped with her blood pressure but she did not like the frequent urination, wished to go back to 12.5 mg daily. She had side effects with amlodipine with lightheadedness and dizziness, wish to stop that. Lisinopril 5 mg daily was added  Rita Berry presents for BP management  Her BP has been better during the day, but is still tracking up in the afternoon. She is taking the lisinopril and the 1/2 chlorthalidone about 5 am, with breakfast. Her BP will start to go up in the afternoon and is in the 140s/90s in the evening.   She wonders if she needs more BP control in the afternoon. She is no longer having vertigo. She has had some nausea with the lisinopril, but that is getting better. 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 She is doing well. Her anxiety is decreased by her BP being under better control. Her vertigo got better once the clonidine was dc'd. In general, she is feeling much better. She is less  worried about her blood pressure.   Past Medical History:  Diagnosis Date  . Arthritis    hip right   . CIN I (cervical intraepithelial neoplasia I)   . Complication of anesthesia   . Humerus distal fracture    left-08-16-16 surgery to repair  . Hypertension   . Ovarian cyst   . PONV (postoperative nausea and vomiting)   . Vertigo    "Positional vertigo"- head elevation best    Past Surgical History:  Procedure Laterality Date  . ABDOMINAL HYSTERECTOMY    . ABDOMINAL SURGERY     Ovarian cyst  . COLPOSCOPY    . GYNECOLOGIC CRYOSURGERY    . JOINT REPLACEMENT    . Joint Replacement Right 10/18/2016   RATH  . OOPHORECTOMY     BSO  . ORIF HUMERUS FRACTURE Left 08/16/2016   Procedure: OPEN TREATMENT OF LEFT DISTAL HUMERUS FRACTURE;  Surgeon: Mack Hook, MD;  Location:  SURGERY CENTER;  Service: Orthopedics;  Laterality: Left;  GENERAL ANESTHESIA WITH PRE-OP BLOCK  . TOTAL HIP ARTHROPLASTY Right 10/18/2016   Procedure: RIGHT TOTAL HIP ARTHROPLASTY ANTERIOR APPROACH;  Surgeon: Durene Romans, MD;  Location: [facility];  Service: Orthopedics;  Laterality: Right;  . TUBAL LIGATION      Current Outpatient Prescriptions  Medication Sig Dispense Refill  . b complex vitamins tablet Take 1 tablet by mouth daily.    . Calcium-Magnesium 500-250 MG TABS Take 1 tablet  by mouth 2 (two) times daily.    . chlorthalidone (HYGROTON) 25 MG tablet Take 12.5 mg by mouth daily.  3  . Cholecalciferol (VITAMIN D) 2000 units CAPS Take 2,000 Units by mouth daily.    . ferrous sulfate 325 (65 FE) MG tablet Take 1 tablet (325 mg total) by mouth 3 (three) times daily after meals.  3  . fluticasone (FLONASE) 50 MCG/ACT nasal spray Place 2 sprays into both nostrils daily. 16 g 6  . lisinopril (PRINIVIL,ZESTRIL) 5 MG tablet Take 1 tablet (5 mg total) by mouth daily. 30 tablet 6  . Multiple Vitamin (MULTIVITAMIN) tablet Take 1 tablet by mouth daily. Reported on 06/14/2016     No current  facility-administered medications for this visit.     Allergies:   Codeine and Latex    Social History:  The patient  reports that she quit smoking about 26 years ago. She has never used smokeless tobacco. She reports that she drinks about 5.4 oz of alcohol per week . She reports that she does not use drugs.   Family History:  The patient's family history includes Bradycardia in her mother; Dementia in her mother; Diabetes in her maternal grandmother and sister; Hyperlipidemia in her father; Hypertension in her father and mother; Stroke in her maternal grandfather and maternal grandmother.    ROS:  Please see the history of present illness. All other systems are reviewed and negative.    PHYSICAL EXAM: VS:  BP 120/72   Pulse 90   Ht 5\' 7"  (1.702 m)   Wt 121 lb 3.2 oz (55 kg)   BMI 18.98 kg/m  , BMI Body mass index is 18.98 kg/m. GEN: Well nourished, well developed, female in no acute distress  HEENT: normal for age  Neck: no JVD, no carotid bruit, no masses Cardiac: RRR; soft murmur, no rubs, or gallops Respiratory:  clear to auscultation bilaterally, normal work of breathing GI: soft, nontender, nondistended, + BS MS: no deformity or atrophy; no edema; distal pulses are 2+ in all 4 extremities   Skin: warm and dry, no rash Neuro:  Strength and sensation are intact Psych: euthymic mood, full affect   EKG:  EKG is not ordered today.  Recent Labs: 10/29/2016: ALT 12 11/21/2016: BUN 11; Creatinine, Ser 0.62; Hemoglobin 13.2; Platelets 233; Potassium 4.1; Sodium 139 12/09/2016: TSH 3.060    Lipid Panel    Component Value Date/Time   CHOL 225 (H) 06/14/2016 1651   TRIG 87 06/14/2016 1651   HDL 90 06/14/2016 1651   CHOLHDL 2.5 06/14/2016 1651   VLDL 17 06/14/2016 1651   LDLCALC 118 06/14/2016 1651     Wt Readings from Last 3 Encounters:  01/13/17 121 lb 3.2 oz (55 kg)  12/09/16 124 lb (56.2 kg)  12/09/16 126 lb 9.6 oz (57.4 kg)     Other studies  Reviewed: Additional studies/ records that were reviewed today include: Office notes and testing.  ASSESSMENT AND PLAN:  1.  Hypertension: She is tolerating the lisinopril pretty well, but her blood pressures not at goal in the afternoon. However, she feels better in general and her anxiety is less. This may help her blood pressure not go so high in the afternoon and evening. We will increase her lisinopril to 5 mg twice a day, with the second dose to be taken at lunch. She is likely to start by taking one half tablet every day at lunch and that is okay. She understands that the maximum dose of lisinopril  was 40 mg a day and she will only be taking a total of 10 mg. Therefore, the dose is not very high and she feels she can tolerate it. Continue the chlorthalidone at one half tablet daily. If she has any issues or concerns, she can take an extra one half-1 lisinopril tablet. Because of the lisinopril, she will need to have a BMET in 2 weeks.  2. Palpitations: She only pressed the button one time while wearing the monitor. The monitor still in process we don't have a final result. I advised her that since I had not received any messages, I felt confident she had no life-threatening arrhythmias. However, advised I will review the report when it was finalized and contact her. She stated that we did not need to contact her if it was okay.   Current medicines are reviewed at length with the patient today.  The patient has concerns regarding medicines. Concerns were addressed  The following changes have been made:  Increase lisinopril  Labs/ tests ordered today include:  No orders of the defined types were placed in this encounter.    Disposition:   FU with Dr. Swazil[place]Jordan in a year or as needed  Signed, Leanna BattlesBarrett, Rhonda, PA-C  01/13/2017 1:50 PM    [facility] Phone: [phone]; Fax: [phone]  This note was written with the assistance of speech recognition software.  Please excuse any transcriptional errors.

## 2017-01-16 ENCOUNTER — Telehealth: Payer: Self-pay | Admitting: Cardiology

## 2017-01-16 NOTE — Telephone Encounter (Signed)
New Message  Pt vorder for labs, pt was in meeting this morning and felt like she was going to pass. Director of nurse took vital and everything was fine. 1305/80 then 118/70. They suggested to have a more compresenhive blood panel for sodium potassium and etc.

## 2017-01-16 NOTE — Telephone Encounter (Signed)
Spoke to patient . She states she is feeling better now.  she was wondering if a CBC  Needs to be obtained.   " ONE OF THE nurse AT HER JOB suggested  After checking inner eyelid and inner mouth.  patient did not states she having any abdominal pain or dark stools.  aware will defer to [facility]. Patient states she will be getting labs on Friday of this week

## 2017-01-16 NOTE — Telephone Encounter (Signed)
Left message to call back  Patient is schedule to have BMP ONLY.

## 2017-01-16 NOTE — Telephone Encounter (Signed)
New Message  Pt c/o Syncope: STAT if syncope occurred within 30 minutes and pt complains of lightheadedness High Priority if episode of passing out, completely, today or in last 24 hours   1. Did you pass out today? Yes  2. When is the last time you passed out? Today  3. Has this occurred multiple times? No  4. Did you have any symptoms prior to passing out? She was upset and felt lightheaded  Pt voiced order for an extensive lab due to pt was in meeting this morning and felt like she was going to pass. Director of nurse took vitals and everything was fine. Pts BP was 135/80 then dropped to 118/70. They suggested to have a more compresenhive blood panel for sodium potassium iron and etc.  Please f/u with pt if needed

## 2017-01-17 ENCOUNTER — Telehealth: Payer: Self-pay | Admitting: Cardiology

## 2017-01-17 NOTE — Telephone Encounter (Signed)
New message   Pt c/o medication issue:  1. Name of Medication: lisinopril  2. How are you currently taking this medication (dosage and times per day)? 5mg    3. Are you having a reaction (difficulty breathing--STAT)? no  4. What is your medication issue? Per pt states she wants to go up on the dosage of medication she is taking. Pt states the 5mg  is not enough. Please call back to discuss

## 2017-01-17 NOTE — Telephone Encounter (Signed)
Spoke with pt states that she would like to go up on her lisinopril 5mg   She is taking it about 5am and noon as directed by Theodore Demarkhonda Barrett. Her BP yesterday in the am 118/72 pulse 72, at lunch she took her lisinopril 5mg  with 1/2 chlorthalidone (12.5mg ) and BP after that was 138/87 pulse 82. Today at 10am 125/77 pulse 77 at noon 146/84 pulse 75 and now [place] is [address] pulse 75. Would you like to change dose? Please advise

## 2017-01-19 ENCOUNTER — Ambulatory Visit: Payer: PRIVATE HEALTH INSURANCE | Admitting: Physician Assistant

## 2017-01-20 ENCOUNTER — Encounter: Payer: Self-pay | Admitting: Physician Assistant

## 2017-01-20 LAB — BASIC METABOLIC PANEL
BUN: 9 mg/dL (ref 7–25)
CALCIUM: 9.2 mg/dL (ref 8.6–10.4)
CO2: 29 mmol/L (ref 20–31)
Chloride: 92 mmol/L — ABNORMAL LOW (ref 98–110)
Creat: 0.63 mg/dL (ref 0.50–0.99)
GLUCOSE: 84 mg/dL (ref 65–99)
Potassium: 4.5 mmol/L (ref 3.5–5.3)
SODIUM: 129 mmol/L — AB (ref 135–146)

## 2017-01-24 ENCOUNTER — Telehealth: Payer: Self-pay | Admitting: Physician Assistant

## 2017-01-24 MED ORDER — LISINOPRIL 10 MG PO TABS: 10.0000 mg | ORAL_TABLET | Freq: Two times a day (BID) | ORAL | 5 refills | Status: DC

## 2017-01-24 MED ORDER — CHLORTHALIDONE 25 MG PO TABS: 12.5000 mg | ORAL_TABLET | Freq: Every day | ORAL | 3 refills | Status: DC

## 2017-01-24 NOTE — Addendum Note (Signed)
Addended by: Ladell HeadsOSE, NATHAN R on: 01/24/2017 01:07 PM   Modules accepted: Orders

## 2017-01-24 NOTE — Telephone Encounter (Signed)
Spoke to patient.  CBC discussed, aware no need to recheck at this time but to call if she does have S&S bleeding, bruising, SOB, increased fatigue, etc.  Also see separate note concerning BMET, advise decrease of chlorthalidone, increase of lisinopril to 10mg  BID OK per Rhonda's discussion w patient previously. Pt will enact changes, encouraged her to follow on BP trends and call if any concerns. She voiced understanding and thanks.

## 2017-01-24 NOTE — Telephone Encounter (Signed)
PT notified she will see how  tolerated

## 2017-01-24 NOTE — Telephone Encounter (Signed)
Spoke to patient, she needed refill of lisinopril at new dose sent to pharmacy prior to leaving on trip. Aware I have sent this in, if questions/new needs to call.

## 2017-01-24 NOTE — Telephone Encounter (Signed)
Increase the pm dose to 7.5 mg, see how tolerated. If needs more, can take 5 mg am, 10 mg pm. Thanks

## 2017-01-24 NOTE — Telephone Encounter (Signed)
Please let her know her CBC was fine in December. If she has no blood loss, should not be anemic.  If she is concerned, ask her PCP. Thanks

## 2017-01-24 NOTE — Telephone Encounter (Signed)
Rita Berry is calling because on Sunday she is going to [place] to camp and want to figure out how not to run out while she is away .

## 2017-01-30 ENCOUNTER — Other Ambulatory Visit: Payer: Self-pay | Admitting: Physician Assistant

## 2017-01-30 DIAGNOSIS — H6091 Unspecified otitis externa, right ear: Secondary | ICD-10-CM

## 2017-02-14 ENCOUNTER — Other Ambulatory Visit: Payer: Self-pay | Admitting: *Deleted

## 2017-02-14 ENCOUNTER — Telehealth: Payer: Self-pay | Admitting: Physician Assistant

## 2017-02-14 NOTE — Telephone Encounter (Signed)
  This medication issue/dose change is being addressed by triage RN in telephone encounter from 02/14/17

## 2017-02-14 NOTE — Telephone Encounter (Signed)
Patient left a msg on the refill vm stating that the lisinopril 20 mg and 10 mg is not scored she would like an rx for 5 mg and 10 mg going forward. Thanks, MI

## 2017-02-14 NOTE — Telephone Encounter (Signed)
Returned call to patient.  We've discussed her BP issues before, she notes some difficulty in managing her pressures - she's had to make small and deliberate changes to BP med dosing.  She takes lisinopril 10mg  BID. States yesterday afternoon she had a reading of 190/100, & states her afternoons typically trend high, though this was highest recent reading.   In response, she took an extra 5mg , (total of 15mg  PM, 25mg  total for day) and thinks this helped her pressure.  She didn't give me a repeat reading from that afternoon.  This AM BP was 135/80.  Asking for advice on this. Informed her it may be reasonable to increase afternoon dose of lisinopril to 20mg  total, or to keep at 10mg  and continue to follow. Would get further advice from pharmD.  She notes also that she's taking chlorthalidone, but would like to leave this dosing unchanged at 12.5mg  daily, due to concern for her serum sodium dropping from higher dose.

## 2017-02-14 NOTE — Telephone Encounter (Signed)
If lisinopril 25mg  in AM and 15mg  in PM is working; okay to continue (total lisinopril dose = 40mg  daily)   Recommend to make clinic appointment with pharmacist HTN clinic if additional problems noted.

## 2017-02-14 NOTE — Telephone Encounter (Signed)
New Message   Pt has been increasing this medication  lisinopril (PRINIVIL,ZESTRIL) 10 MG tablet Take 1 tablet (10 mg total) by mouth 2 (two) times daily.   She took 10mg  in morning and 15mg  in afternoon, is there a way she can 25mg ?

## 2017-02-14 NOTE — Telephone Encounter (Signed)
To clarify, patient taking 10mg  in AM and 15mg  in PM for total of 25mg  daily.  Communicated advice that if the current dosing is working for her, continue this regimen, call if new concerns or if BP does not improve.

## 2017-02-20 ENCOUNTER — Telehealth: Payer: Self-pay | Admitting: Physician Assistant

## 2017-02-20 NOTE — Telephone Encounter (Signed)
New Message  Pt c/o medication issue:  1. Name of Medication: chlorthalidone 25 tablet twice daily  2. How are you currently taking this medication (dosage and times per day)? See above  3. Are you having a reaction (difficulty breathing--STAT)? N/A  4. What is your medication issue? Pt voiced she had heavy dizziness and spoke with pharmacist and they recommended hydrochlorothiazide.  Please f/u with pt

## 2017-02-20 NOTE — Telephone Encounter (Signed)
No answer will retry tomorrow

## 2017-02-21 ENCOUNTER — Encounter: Payer: Self-pay | Admitting: Physician Assistant

## 2017-02-21 NOTE — Telephone Encounter (Signed)
F/U CALL:  Patient is returning  Your call, thanks.

## 2017-02-21 NOTE — Telephone Encounter (Signed)
Please clarify the lisinopril dose.  If she is taking 10 mg am and 25 mg pm, go to 10 mg am, 30 mg pm. Max dose 40 mg qd, she would still be below that. Ok to stop the chlorthalidone. Would like to make sure the lisinopril does not work before adding another medication. Offer her a HTN appt with one of the PharmD's here.

## 2017-02-21 NOTE — Progress Notes (Signed)
To review:  Initially pt on Lasix>>stopped this and Chlorthalidone started>>dose decreased after NTG patch started Then side effects continued and pt switched to clonidine>>more side effects and started coming to cards for BP management.  Did not tolerate amlodipine 2.5 mg. Did not tolerate Lasix 20 mg in 2016/2017 Did not tolerate clonidine Did not tolerate chlorthalidone Hydralazine mentioned in PCP note 12/09/2016 but do not see that she was prescribed it (?if they meant clonidine?).  Most of the time her side effect is dizziness, she has seen specialists with no cause found.  She sometimes also gets weak and lightheaded. 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  Leanna BattlesBarrett, Hughey Rittenberry, PA-C 02/21/2017 3:33 PM Beeper [phone]

## 2017-02-21 NOTE — Telephone Encounter (Signed)
Spoke to patient, had extensive discussion w her about her chlorthalidone dosing and SEs of dizziness at current reported dose. She's been taking 12.5mg  daily, cut down to 6.25mg , and still experiencing dizziness. Voiced that her BPs have been running 130s/70s-80s.  She has f/u w Bjorn Loserhonda April 9th.  [facility] that she might want to ask about being prescribed HCTZ in place of this.  Notes chlorthalidone initially prescribed by Theora Gianottihelle Jeffrey PA-C and she's not been on HCTZ before. Had been on lasix in the past, unsure if she even needs diuretic.  Advised at current time, if she's only been taking 1/4 tab of chlorthalidone, reasonable probably to just hold the chlorthalidone and monitor BPs and for any swelling.   Informed her I'd route for further instruction from ShongopoviRhonda. Pt voiced understanding, and thanks.

## 2017-02-21 NOTE — Telephone Encounter (Signed)
We discussed lisinopril which I forgot to note. Pt taking 2.5mg -5mg  of lisinopril in AM and 20mg  in PM.  (total of 25mg  daily)  I advised OK to move lisinopril up to 10mg  in AM and 20mg  in PM (total of 30mg ), informed her OK per provider instruction to stop the chlorthalidone as well. Recommended she call in a week or so if continued concerns so that we can arrange BP clinic visit. Pt voiced agreement and thanks.

## 2017-02-21 NOTE — Telephone Encounter (Signed)
Left msg to call.

## 2017-02-22 ENCOUNTER — Encounter: Payer: Self-pay | Admitting: Diagnostic Neuroimaging

## 2017-02-22 ENCOUNTER — Ambulatory Visit (INDEPENDENT_AMBULATORY_CARE_PROVIDER_SITE_OTHER): Payer: PRIVATE HEALTH INSURANCE | Admitting: Diagnostic Neuroimaging

## 2017-02-22 VITALS — BP 117/79 | HR 83 | Wt 124.8 lb

## 2017-02-22 DIAGNOSIS — H9313 Tinnitus, bilateral: Secondary | ICD-10-CM | POA: Diagnosis not present

## 2017-02-22 DIAGNOSIS — R42 Dizziness and giddiness: Secondary | ICD-10-CM | POA: Diagnosis not present
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
## 2017-03-06 ENCOUNTER — Ambulatory Visit: Payer: PRIVATE HEALTH INSURANCE | Admitting: Cardiology

## 2017-03-27 ENCOUNTER — Ambulatory Visit (INDEPENDENT_AMBULATORY_CARE_PROVIDER_SITE_OTHER): Payer: PRIVATE HEALTH INSURANCE | Admitting: Physician Assistant

## 2017-03-27 VITALS — BP 116/80 | HR 74 | Ht 67.0 in | Wt 128.0 lb

## 2017-03-27 DIAGNOSIS — Z79899 Other long term (current) drug therapy: Secondary | ICD-10-CM | POA: Diagnosis not present

## 2017-03-27 DIAGNOSIS — I10 Essential (primary) hypertension: Secondary | ICD-10-CM

## 2017-03-27 MED ORDER — LISINOPRIL 10 MG PO TABS: 10.0000 mg | ORAL_TABLET | Freq: Two times a day (BID) | ORAL | 11 refills | Status: DC

## 2017-03-27 MED ORDER — LISINOPRIL 5 MG PO TABS: 5.0000 mg | ORAL_TABLET | Freq: Every day | ORAL | 11 refills | Status: DC

## 2017-03-27 NOTE — Patient Instructions (Signed)
Medication Instructions:  RE-START CHLORTHALIDONE  EVERY OTHER DAY   If you need a refill on your cardiac medications before your next appointment, please call your pharmacy.  Follow-Up: Your physician wants you to follow-up in: 6 MONTHS WITH RHONDA WHEN DR Swaziland IS HERE You will receive a reminder letter in the mail two months in advance. If you don't receive a letter, please call our office July 2018 to schedule the October 2018 follow-up appointment.   Special Instructions: WILL CALL YOU TO SCHEDULE MONITOR PLACEMENT    Thank you for choosing [facility] at [facility]!!    RHONDA BARRETT, Franchot Gallo, LPN

## 2017-03-27 NOTE — Progress Notes (Signed)
Cardiology Office Note   Date:  03/27/2017   ID:  Rita Berry, DOB 1956/07/28, MRN [id number]  PCP:  Porfirio Oar, PA-C  Cardiologist:  Dr. Swaziland reviewed patient with him at office visit 12/06/2016  Rita Berry 01/13/2017  Chief Complaint  Patient presents with  . 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 female with a history of HTN, CIN, ovarian cyst, OA, BPPV  To review BP management:  Initially pt on Lasix>>stopped this and Chlorthalidone started>>dose decreased after NTG patch started Then side effects continued and pt switched to clonidine>>more side effects and started coming to cards for BP management.  Did not tolerate amlodipine 2.5 mg. Did not tolerate Lasix 20 mg in 2016/2017 Did not tolerate clonidine Did not tolerate chlorthalidone Hydralazine mentioned in PCP note 12/09/2016 but do not see that she was prescribed it (?if they meant clonidine?). She has tolerated lisinopril 5 mg>>10 mg>>>>10 mg am, 30 mg pm BP tends to spike in the early afternoon/evening; uncontrolled blood pressure makes her nervous Most of the time her side effect is dizziness, she has seen specialists with no cause found except BPPV  She sometimes also gets weak and lightheaded. 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. She is doing well with this. Her BP is much better controlled now. She feels stabilized on lisinopril 15 mg bid. She is having some problems with lower extremity edema but feels that taking chlorthalidone every day is a  little bit much. She wonders if we can cut that back to every other day.  She is not having significant palpitations, no orthopnea or PND, no dyspnea on exertion. She does have a cough, but that seems to occur shortly after she takes the medication and then goes away. She is not having any chest pain at all. She is getting some mild dizziness that she feels is the chlorthalidone, but not severe.   Past Medical History:  Diagnosis Date  . Arthritis    hip right   . CIN I (cervical intraepithelial neoplasia I)   . Complication of anesthesia   . Humerus distal fracture    left-08-16-16 surgery to repair  . Hypertension   . Ovarian cyst   . PONV (postoperative nausea and vomiting)   . Vertigo    "Positional vertigo"- head elevation best    Past Surgical History:  Procedure Laterality Date  . ABDOMINAL HYSTERECTOMY    . ABDOMINAL SURGERY     Ovarian cyst  . COLPOSCOPY    . GYNECOLOGIC CRYOSURGERY    . OOPHORECTOMY     BSO  . ORIF HUMERUS FRACTURE Left 08/16/2016   Procedure: OPEN TREATMENT OF LEFT DISTAL HUMERUS FRACTURE;  Surgeon: Mack Hook, MD;  Location: [facility];  Service: Orthopedics;  Laterality: Left;  GENERAL ANESTHESIA WITH PRE-OP BLOCK  . TOTAL HIP ARTHROPLASTY Right 10/18/2016   Procedure: RIGHT TOTAL HIP ARTHROPLASTY ANTERIOR APPROACH;  Surgeon: Durene Romans, MD;  Location: [facility];  Service: Orthopedics;  Laterality: Right;  . TUBAL LIGATION      Current  Outpatient Prescriptions  Medication Sig Dispense Refill  . b complex vitamins tablet Take 1 tablet by mouth daily.    . Calcium-Magnesium 500-250 MG TABS Take 1 tablet by mouth 2 (two) times daily.    . Cholecalciferol (VITAMIN D) 2000 units CAPS Take 2,000 Units by mouth daily.    . ferrous sulfate 325 (65 FE) MG tablet Take 1 tablet (325 mg total) by mouth 3 (three) times daily after meals.  3  . fluticasone (FLONASE) 50 MCG/ACT nasal spray Place 2 sprays into both nostrils daily. 16 g 6  .  lisinopril (PRINIVIL,ZESTRIL) 10 MG tablet Take 1 tablet (10 mg total) by mouth 2 (two) times daily. (Patient taking differently: Take 10 mg by mouth 2 (two) times daily. Takes this dose and  in PM.) 60 tablet 5  . lisinopril (PRINIVIL,ZESTRIL) 5 MG tablet Take 5 mg by mouth daily at 2 PM. Takes this dose along with  BID.  5  . Multiple Vitamin (MULTIVITAMIN) tablet Take 1 tablet by mouth daily. Reported on 06/14/2016     No current facility-administered medications for this visit.     Allergies:   Codeine and Latex    Social History:  The patient  reports that she quit smoking about 26 years ago. She has never used smokeless tobacco. She reports that she drinks about 5.4 oz of alcohol per week . She reports that she does not use drugs.   Family History:  The patient's family history includes Bradycardia in her mother; Dementia in her mother; Diabetes in her maternal grandmother and sister; Hyperlipidemia in her father; Hypertension in her father and mother; Stroke in her maternal grandfather and maternal grandmother.    ROS:  Please see the history of present illness. All other systems are reviewed and negative.    PHYSICAL EXAM: VS:  BP 116/80 (BP Location: Left Arm, Patient Position: Sitting, Cuff Size: Normal)   Pulse 74   Ht  (1.702 m)   Wt 128 lb (58.1 kg)   BMI 20.05 kg/m  , BMI Body mass index is 20.05 kg/m. GEN: Well nourished, well developed, female in no acute distress  HEENT: normal for age  Neck: no JVD, no carotid bruit, no masses Cardiac: RRR; no murmur, no rubs, or gallops Respiratory:  clear to auscultation bilaterally, normal work of breathing GI: soft, nontender, nondistended, + BS MS: no deformity or atrophy; no edema; distal pulses are 2+ in all 4 extremities   Skin: warm and dry, no rash Neuro:  Strength and sensation are intact Psych: euthymic mood, full affect   EKG:  EKG is not ordered today.  Recent Labs: 10/29/2016: ALT 12 11/21/2016:  Hemoglobin 13.2; Platelets 233 12/09/2016: TSH 3.060 01/19/2017: BUN 9; Creat 0.63; Potassium 4.5; Sodium 129    Lipid Panel    Component Value Date/Time   CHOL 225 (H) 06/14/2016 1651   TRIG 87 06/14/2016 1651   HDL 90 06/14/2016 1651   CHOLHDL 2.5 06/14/2016 1651   VLDL 17 06/14/2016 1651   LDLCALC 118 06/14/2016 1651     Wt Readings from Last 3 Encounters:  03/27/17 128 lb (58.1 kg)  02/22/17 124 lb 12.8 oz (56.6 kg)  01/13/17 121 lb 3.2 oz (55 kg)     Other studies Reviewed: Additional studies/ records that were reviewed today include: Office notes, hospital records and testing.  ASSESSMENT AND PLAN:  1.  HTN: She will continue the lisinopril at 15 mg bid, she has a mild cough, worse recently but  she feels that is allergies. Chlorthalidone will be 1/2 of 25 mg tablet qod. She is currently taking 1/2 tab qd, is still getting dizzy at times. She is to contact us if she needs additional medication adjustments. Her renal function and electrolytes have been stable on the chlorthalidone and the lisinopril.   Current medicines are reviewed at length with the patient today.  The patient has concerns regarding medicines. Concerns were addressed  The following changes have been made:  Chlorthalidone 25 mg, one half tab every other day  Labs/ tests ordered today include:  No orders of the defined types were placed in this encounter.    Disposition:   FU with Dr. Swaziland or myself  Signed, Rita Berry  03/27/2017 2:52 PM    [facility] Phone: [phone]; Fax: [phone]  This note was written with the assistance of speech recognition software. Please excuse any transcriptional errors.

## 2017-03-30 ENCOUNTER — Other Ambulatory Visit: Payer: Self-pay | Admitting: Physician Assistant

## 2017-03-30 NOTE — Telephone Encounter (Signed)
New message     Could you send over 2 prescriptions for the  lisinopril (PRINIVIL,ZESTRIL) 10 MG tablet Take 1 tablet (10 mg total) by mouth 2 (two) times daily. Takes this dose and      at  and 5 mg , so she doesn't have to cut them in half   [facility] [place]

## 2017-03-31 NOTE — Telephone Encounter (Signed)
Medication Detail    Disp Refills Start End   lisinopril (PRINIVIL,ZESTRIL) 10 MG tablet 60 tablet 11 03/27/2017    Sig - Route: Take 1 tablet (10 mg total) by mouth 2 (two) times daily. Takes this dose and  in PM. - Oral   Notes to Pharmacy: New dose.   E-Prescribing Status: Receipt confirmed by pharmacy (03/27/2017 3:15 PM EDT)   Pharmacy   [id number] - [place], [place] - [address]   Medication Detail    Disp Refills Start End   lisinopril (PRINIVIL,ZESTRIL) 5 MG tablet 30 tablet 11 03/27/2017    Sig - Route: Take 1 tablet (5 mg total) by mouth daily at 2 PM. Takes this dose along with  BID. - Oral   E-Prescribing Status: Receipt confirmed by pharmacy (03/27/2017 3:15 PM EDT)   Pharmacy   [id number] - [place], [place] - [address] AT [address]

## 2017-04-02 ENCOUNTER — Encounter: Payer: Self-pay | Admitting: Physician Assistant

## 2017-04-03 ENCOUNTER — Other Ambulatory Visit: Payer: Self-pay | Admitting: *Deleted

## 2017-04-03 ENCOUNTER — Other Ambulatory Visit: Payer: Self-pay | Admitting: Physician Assistant

## 2017-04-03 MED ORDER — LISINOPRIL 30 MG PO TABS: 30.0000 mg | ORAL_TABLET | Freq: Every day | ORAL | 3 refills | Status: DC

## 2017-04-17 ENCOUNTER — Encounter (HOSPITAL_COMMUNITY): Payer: Self-pay | Admitting: Emergency Medicine

## 2017-04-17 ENCOUNTER — Emergency Department (HOSPITAL_COMMUNITY)
Admission: EM | Admit: 2017-04-17 | Discharge: 2017-04-17 | Disposition: A | Discharge status: Home / Self Care | Payer: PRIVATE HEALTH INSURANCE | Attending: Emergency Medicine | Admitting: Emergency Medicine

## 2017-04-17 DIAGNOSIS — R55 Syncope and collapse: Secondary | ICD-10-CM | POA: Insufficient documentation

## 2017-04-17 DIAGNOSIS — Z91040 Latex allergy status: Secondary | ICD-10-CM | POA: Diagnosis not present

## 2017-04-17 DIAGNOSIS — I10 Essential (primary) hypertension: Secondary | ICD-10-CM | POA: Insufficient documentation

## 2017-04-17 DIAGNOSIS — Z79899 Other long term (current) drug therapy: Secondary | ICD-10-CM | POA: Insufficient documentation

## 2017-04-17 DIAGNOSIS — Z96641 Presence of right artificial hip joint: Secondary | ICD-10-CM | POA: Diagnosis not present

## 2017-04-17 LAB — BASIC METABOLIC PANEL
ANION GAP: 7 (ref 5–15)
BUN: 12 mg/dL (ref 6–20)
CO2: 29 mmol/L (ref 22–32)
Calcium: 8.9 mg/dL (ref 8.9–10.3)
Chloride: 95 mmol/L — ABNORMAL LOW (ref 101–111)
Creatinine, Ser: 0.58 mg/dL (ref 0.44–1.00)
GFR calc Af Amer: >60 mL/min (ref >60)
GLUCOSE: 98 mg/dL (ref 65–99)
Potassium: 3.5 mmol/L (ref 3.5–5.1)
Sodium: 131 mmol/L — ABNORMAL LOW (ref 135–145)

## 2017-04-17 LAB — CBC
HEMATOCRIT: 39 % (ref 36.0–46.0)
Hemoglobin: 13.6 g/dL (ref 12.0–15.0)
MCH: 32.4 pg (ref 26.0–34.0)
MCHC: 34.9 g/dL (ref 30.0–36.0)
MCV: 92.9 fL (ref 78.0–100.0)
Platelets: 236 10*3/uL (ref 150–400)
RBC: 4.2 MIL/uL (ref 3.87–5.11)
RDW: 12.8 % (ref 11.5–15.5)
WBC: 6 10*3/uL (ref 4.0–10.5)

## 2017-04-17 NOTE — Discharge Instructions (Signed)
An appointment has been scheduled for you for the office at [facility] for Wednesday, May 2 at 2 PM. Return to the emergency department if your condition worsens or if concern for any reason

## 2017-04-17 NOTE — ED Provider Notes (Signed)
[facility]-EMERGENCY DEPT Provider Note   CSN: [id number] Arrival date & time: 04/17/17  1102     History   Chief Complaint Chief Complaint  Patient presents with  . Near Syncope    HPI Rita Berry is a 61 y.o. female.  HPI Patient became lightheaded and suffer near syncopal event several minutes after becoming lightheaded this morning at approximately 10:15 AM. She reports that she had taken her chlorthalidone approximate 1 hour earlier than normal at 9:30 AM today he denies headache denies chest pain denies abdominal pain presently feels well. She's not lightheaded on standing. No other associated symptoms. Past Medical History:  Diagnosis Date  . Arthritis    hip right   . CIN I (cervical intraepithelial neoplasia I)   . Complication of anesthesia   . Humerus distal fracture    left-08-16-16 surgery to repair  . Hypertension   . Ovarian cyst   . PONV (postoperative nausea and vomiting)   . Vertigo    "Positional vertigo"- head elevation best    Patient Active Problem List   Diagnosis Date Noted  . 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 Osteoarthritis of right hip 06/14/2016  . Essential hypertension 06/12/2015  . Diverticulosis 06/12/2015  . Pain in joint, ankle and foot 09/16/2014  . Bilateral bunions 09/16/2014  . Iliotibial band syndrome of left side 10/30/2013  . Greater trochanteric bursitis of both hips 10/30/2013  . Abnormality of gait 06/18/2013  . Eucalcemic Hyperparathyroidism 04/19/2013  . Osteopenia 04/19/2013  . CIN I (cervical intraepithelial neoplasia I)   . Ovarian cyst     Past Surgical History:  Procedure Laterality Date  . ABDOMINAL HYSTERECTOMY    . ABDOMINAL SURGERY     Ovarian cyst  . COLPOSCOPY    . GYNECOLOGIC CRYOSURGERY    . OOPHORECTOMY     BSO  . ORIF HUMERUS FRACTURE Left 08/16/2016   Procedure: OPEN TREATMENT OF LEFT DISTAL HUMERUS FRACTURE;  Surgeon: Mack Hook, MD;  Location: [facility];  Service: Orthopedics;   Laterality: Left;  GENERAL ANESTHESIA WITH PRE-OP BLOCK  . TOTAL HIP ARTHROPLASTY Right 10/18/2016   Procedure: RIGHT TOTAL HIP ARTHROPLASTY ANTERIOR APPROACH;  Surgeon: Durene Romans, MD;  Location: [facility];  Service: Orthopedics;  Laterality: Right;  . TUBAL LIGATION      OB History    Gravida Para Term Preterm AB Living   0             SAB TAB Ectopic Multiple Live Births                   Home Medications    Prior to Admission medications   Medication Sig Start Date End Date Taking? Authorizing Provider  b complex vitamins tablet Take 1 tablet by mouth daily.    Historical Provider, MD  Calcium-Magnesium 500-250 MG TABS Take 1 tablet by mouth 2 (two) times daily.    Historical Provider, MD  chlorthalidone (HYGROTON) 25 MG tablet Take 25 mg by mouth every other day. 03/18/17   Historical Provider, MD  Cholecalciferol (VITAMIN D) 2000 units CAPS Take 2,000 Units by mouth daily.    Historical Provider, MD  ferrous sulfate 325 (65 FE) MG tablet Take 1 tablet (325 mg total) by mouth 3 (three) times daily after meals. 10/19/16   Lanney Gins, PA-C  fluticasone (FLONASE) 50 MCG/ACT nasal spray Place 2 sprays into both nostrils daily. 11/24/16   Collene Gobble, MD  lisinopril (PRINIVIL,ZESTRIL) 30 MG tablet TAKE  1 TABLET BY MOUTH EVERY DAY ALONG WITH THE  TABLET IN THE EVENING 04/03/17   Rhonda G Barrett, PA-C  Multiple Vitamin (MULTIVITAMIN) tablet Take 1 tablet by mouth daily. Reported on 06/14/2016    Historical Provider, MD    Family History Family History  Problem Relation Age of Onset  . Hypertension Mother   . Dementia Mother   . Bradycardia Mother   . Hypertension Father   . Hyperlipidemia Father   . Diabetes Sister     gestational  . Diabetes Maternal Grandmother   . Stroke Maternal Grandmother   . Stroke Maternal Grandfather     Social History Social History  Substance Use Topics  . Smoking status: Former Smoker    Quit date: 10/10/1990  . Smokeless tobacco: Never  Used  . Alcohol use 5.4 oz/week    5 Glasses of wine, 4 Standard drinks or equivalent per week     Comment: social     Allergies   Codeine and Latex   Review of Systems Review of Systems  Constitutional: Negative.   HENT: Negative.   Respiratory: Negative.   Cardiovascular: Negative.   Gastrointestinal: Negative.   Musculoskeletal: Positive for arthralgias.       Wearing left thumb splint secondary to arthritis  Skin: Negative.   Neurological: Positive for light-headedness.  Psychiatric/Behavioral: Negative.   All other systems reviewed and are negative.    Physical Exam Updated Vital Signs BP (!) 142/77   Pulse 73   Temp 98 F (36.7 C) (Oral)   Resp 18   Ht  (1.702 m)   Wt 128 lb (58.1 kg)   SpO2 100%   BMI 20.05 kg/m   Physical Exam  Constitutional: She appears well-developed and well-nourished.  HENT:  Head: Normocephalic and atraumatic.  Eyes: Conjunctivae are normal. Pupils are equal, round, and reactive to light.  Neck: Neck supple. No tracheal deviation present. No thyromegaly present.  Cardiovascular: Normal rate and regular rhythm.   No murmur heard. Pulmonary/Chest: Effort normal and breath sounds normal.  Abdominal: Soft. Bowel sounds are normal. She exhibits no distension. There is no tenderness.  Musculoskeletal: Normal range of motion. She exhibits no edema or tenderness.  Neurological: She is alert. Coordination normal.  Skin: Skin is warm and dry. No rash noted.  Psychiatric: She has a normal mood and affect.  Nursing note and vitals reviewed.    ED Treatments / Results  Labs (all labs ordered are listed, but only abnormal results are displayed) Labs Reviewed  CBC  BASIC METABOLIC PANEL  URINALYSIS, ROUTINE W REFLEX MICROSCOPIC  CBG MONITORING, ED    EKG  EKG Interpretation  Date/Time:  Monday April 17 2017 11:19:00 EDT Ventricular Rate:  71 PR Interval:    QRS Duration: 94 QT Interval:  411 QTC Calculation: 447 R  Axis:   26 Text Interpretation:  Sinus rhythm Probable left atrial enlargement No significant change since last tracing Confirmed by Ethelda Chick  MD, Persephone Schriever [phone]) on 04/17/2017 12:23:34 PM       Radiology No results found.  Procedures Procedures (including critical care time)  Medications Ordered in ED Medications - No data to display   Initial Impression / Assessment and Plan / ED Course  I have reviewed the triage vital signs and the nursing notes.  Pertinent labs & imaging results that were available during my care of the patient were reviewed by me and considered in my medical decision making (see chart for details).   patient remained in  sinus rhythm throughout her entire emergency department stay  1:40 PM patient asymptomatic. No distress. I spoke with [facility] via telephone. I've arranged for patient have outpatient follow-up appointment scheduled for 04/19/2017 at 2 PM Final Clinical Impressions(s) / ED Diagnoses  Diagnoses near syncope Final diagnoses:  None    New Prescriptions New Prescriptions   No medications on file     Doug Sou, MD 04/17/17 1341

## 2017-04-17 NOTE — ED Triage Notes (Addendum)
Pt from work via [employer] with c/o dizziness followed by a near syncopal event while sitting at a table.  Pt laid her head down because she reports "it felt so heavy I had to."  Pt reports day has been relatively normal.  Hx of the same per coworker approx 6 months ago with unexplained cause/hx vertigo.  Denies all complaints at this time.  NAD, A&O.

## 2017-04-19 ENCOUNTER — Ambulatory Visit (INDEPENDENT_AMBULATORY_CARE_PROVIDER_SITE_OTHER): Payer: PRIVATE HEALTH INSURANCE | Admitting: Nurse Practitioner

## 2017-04-19 ENCOUNTER — Encounter: Payer: Self-pay | Admitting: Nurse Practitioner

## 2017-04-19 VITALS — BP 122/72 | HR 85 | Ht 67.0 in | Wt 129.8 lb

## 2017-04-19 DIAGNOSIS — I10 Essential (primary) hypertension: Secondary | ICD-10-CM

## 2017-04-19 DIAGNOSIS — R55 Syncope and collapse: Secondary | ICD-10-CM

## 2017-04-19 MED ORDER — LOSARTAN POTASSIUM 25 MG PO TABS: 25.0000 mg | ORAL_TABLET | Freq: Two times a day (BID) | ORAL | 3 refills | Status: DC

## 2017-04-19 NOTE — Patient Instructions (Signed)
Medication Instructions: Stop taking lisinopril Start taking Losartan 25 mg twice daily. Do not take the evening dose on the days you take Chlorthalidone.  Follow-Up: Your physician recommends that you schedule a follow-up appointment in: one month with Theodore Demark, PA   If you need a refill on your cardiac medications before your next appointment, please call your pharmacy.

## 2017-04-19 NOTE — Progress Notes (Signed)
Office Visit    Patient Name: Rita Berry Date of Encounter: 04/19/2017  Primary Care Provider:  Porfirio Oar, PA-C Primary Cardiologist:  P. [place], MD   Chief Complaint    61 year old female with a prior history of hypertension who presents for follow-up after recent presyncopal spell.  Past Medical History    Past Medical History:  Diagnosis Date  . Arthritis    hip right   . CIN I (cervical intraepithelial neoplasia I)   . Complication of anesthesia   . Essential hypertension   . Humerus distal fracture    left-08-16-16 surgery to repair  . Ovarian cyst   . PONV (postoperative nausea and vomiting)   . Pre-syncope    a. ? orthostasis/dehydration 03/2017.  Marland Kitchen Vertigo    "Positional vertigo"- head elevation best   Past Surgical History:  Procedure Laterality Date  . ABDOMINAL HYSTERECTOMY    . ABDOMINAL SURGERY     Ovarian cyst  . COLPOSCOPY    . GYNECOLOGIC CRYOSURGERY    . OOPHORECTOMY     BSO  . ORIF HUMERUS FRACTURE Left 08/16/2016   Procedure: OPEN TREATMENT OF LEFT DISTAL HUMERUS FRACTURE;  Surgeon: Mack Hook, MD;  Location: [facility];  Service: Orthopedics;  Laterality: Left;  GENERAL ANESTHESIA WITH PRE-OP BLOCK  . TOTAL HIP ARTHROPLASTY Right 10/18/2016   Procedure: RIGHT TOTAL HIP ARTHROPLASTY ANTERIOR APPROACH;  Surgeon: Durene Romans, MD;  Location: [facility];  Service: Orthopedics;  Laterality: Right;  . TUBAL LIGATION      Allergies  Allergies  Allergen Reactions  . Codeine Nausea And Vomiting  . Latex Itching    History of Present Illness    61 year old female with the above past medical history including hypertension, which has been treated with chlorthalidone for several years. She says that over that time, she has had intermittent dizziness, which she always attributed to chlorthalidone therapy. Since having right hip surgery in the fall 2017, blood pressure has been variable. In December 2017, she was placed on clonidine  but was subsequently noted to have some bradycardia. She had event monitoring in early 2018 which was unrevealing. Eventually, clonidine was weaned off secondary to intermittent spikes in blood pressure in rebound hypertension. She was subsequently placed on amlodipine, but this seemed to make intermittent dizzy spells worse. Lisinopril was subsequently added and titrated. She is currently taking chlorthalidone 12.5 mg every other day, though sometimes she takes only 6.25 mg (quarter of 25 mg tablet). She is also taking lisinopril 15 mg twice a day. She notes that often on the days where she states taking chlorthalidone, but her blood pressure is on the lower side in the evenings and she is forgoing her evening lisinopril dose or at the very least taking a lower dose.   On April 30, she took both lisinopril 15 mg and chlorthalidone 12.5 mg in the morning. While at work and sitting at a meeting later in the morning, she felt lightheaded and rested her head on the table. She says she did not lose consciousness. She was attended to by coworkers. She simply felt lightheaded. When her blood pressure was checked it was reportedly normal. EMS was called and she was taken to the emergency department. There, ECG was nonacute. Sodium was mildly low at 131. Renal function was stable. H&H were normal. Blood pressures were stable to high and she was subsequently discharged home and follow-up with cardiology was recommended. She has had no recurrence of lightheadedness since that  day. She did take both lisinopril and chlorthalidone today. She is most interested in finding some happy medium where she is able to tolerate her blood pressure medications without swings in blood pressure. We discussed this at length today. She notes that in the absence of a diuretic, she does have some swelling of her hands and ankles and therefore would like to stay on some dose of a diuretic. She does have a cough on lisinopril and up to this point  has just been tolerating it. She denies chest pain, palpitations, dyspnea, PND, orthopnea, syncope, or early satiety.  Home Medications    Prior to Admission medications   Medication Sig Start Date End Date Taking? Authorizing Provider  b complex vitamins tablet Take 1 tablet by mouth daily.   Yes Historical Provider, MD  Calcium-Magnesium 500-250 MG TABS Take 1 tablet by mouth 2 (two) times daily.   Yes Historical Provider, MD  chlorthalidone (HYGROTON) 25 MG tablet Take 25 mg by mouth every other day. 03/18/17  Yes Historical Provider, MD  Cholecalciferol (VITAMIN D) 2000 units CAPS Take 2,000 Units by mouth daily.   Yes Historical Provider, MD  ferrous sulfate 325 (65 FE) MG tablet Take 1 tablet (325 mg total) by mouth 3 (three) times daily after meals. 10/19/16  Yes Matthew Babish, PA-C  fluticasone (FLONASE) 50 MCG/ACT nasal spray Place 2 sprays into both nostrils daily. 11/24/16  Yes Collene Gobble, MD  Multiple Vitamin (MULTIVITAMIN) tablet Take 1 tablet by mouth daily. Reported on 06/14/2016   Yes Historical Provider, MD  losartan (COZAAR) 25 MG tablet Take 1 tablet (25 mg total) by mouth 2 (two) times daily. Do not take the evening dose on the days you take Chlorthalidone. 04/19/17 07/18/17  Ok Anis, NP    Review of Systems    Presyncope/lightheadedness as outlined above.  All other systems reviewed and are otherwise negative except as noted above.  Physical Exam    VS:  BP 122/72   Pulse 85   Ht  (1.702 m)   Wt 129 lb 12.8 oz (58.9 kg)   BMI 20.33 kg/m  , BMI Body mass index is 20.33 kg/m. GEN: Well nourished, well developed, in no acute distress.  HEENT: normal.  Neck: Supple, no JVD, carotid bruits, or masses. Cardiac: RRR, no murmurs, rubs, or gallops. No clubbing, cyanosis, edema.  Radials/DP/PT 2+ and equal bilaterally.  Respiratory:  Respirations regular and unlabored, clear to auscultation bilaterally. GI: Soft, nontender, nondistended, BS + x 4. MS: no  deformity or atrophy. Skin: warm and dry, no rash. Neuro:  Strength and sensation are intact. Psych: Normal affect.  Accessory Clinical Findings    ECG - EKG from April 30 reviewed-regular sinus rhythm, 71, left atrial enlargement. No acute changes.  Assessment & Plan    1.  Essential hypertension: Patient has a history of swings in blood pressure and also notes intermittent lightheadedness with lower blood pressures on day she takes chlorthalidone. We discussed this at length today. She does feel that some diuretic will be important as she does have some hand and lower extremity swelling if she is not on a diuretic. She is currently also on lisinopril and this does cause her to cough some. I have recommended the following: She'll begin taking losartan 25 mg twice a day and every other day, as she is doing now, she will take chlorthalidone 12.5 mg daily. I recommended that on the days she is taking chlorthalidone, she should not take the  afternoon dose of losartan. I suspect that if she were willing to try it, we could probably manage her with losartan 25 mg daily in addition to chlorthalidone 12.5 mg daily. For now, she prefers to stick with chlorthalidone every other day. She also prefers trying losartan bid instead of a higher qd dose.  She is encouraged by this current plan. Hopefully she will feel better on the chlorthalidone that she currently does on lisinopril and she will not have the cough. Renal function was stable on recent evaluation.  2. Presyncope: She has previously documented lower blood pressures on days that she takes both lisinopril and chlorthalidone. She had lightheadedness this past Monday and notes that she was outside most of the day on Sunday working in the yard, mowing her parents yard, and then going for a long walk. She suspects that she was not adequately hydrated. Her BUN and creatinine were normal in the ER. Changes to blood pressure medications as outlined above. Of  note, she was not having any palpitations prior to this event. Documented vital signs at the time of event were normal. There were no arrhythmias related by EMS or in the emergency department. She has previously worn an event monitor without any significant findings.  3. Disposition: Patient follow-up in one month.   Nicolasa Ducking, NP 04/19/2017, 5:19 PM

## 2017-04-24 ENCOUNTER — Encounter: Payer: Self-pay | Admitting: Nurse Practitioner

## 2017-05-01 ENCOUNTER — Other Ambulatory Visit: Payer: Self-pay | Admitting: Nurse Practitioner

## 2017-05-01 DIAGNOSIS — I10 Essential (primary) hypertension: Secondary | ICD-10-CM

## 2017-05-01 MED ORDER — HYDROCHLOROTHIAZIDE 12.5 MG PO CAPS: 12.5000 mg | ORAL_CAPSULE | Freq: Every day | ORAL | 3 refills | Status: DC

## 2017-05-01 NOTE — Progress Notes (Signed)
Pt has been having difficulty splitting chlorthalidone tablets and has requested HCTZ 12.5 mg tabs.  A full 25 mg of chlorthalidone drops her bp too low and thus she doesn't tolerate, thus we'll try HCTZ.

## 2017-05-05 ENCOUNTER — Ambulatory Visit: Payer: PRIVATE HEALTH INSURANCE | Attending: Otolaryngology | Admitting: Physical Therapy

## 2017-05-05 ENCOUNTER — Encounter: Payer: Self-pay | Admitting: Nurse Practitioner

## 2017-05-05 ENCOUNTER — Encounter: Payer: Self-pay | Admitting: Physical Therapy

## 2017-05-05 DIAGNOSIS — R42 Dizziness and giddiness: Secondary | ICD-10-CM | POA: Insufficient documentation

## 2017-05-05 DIAGNOSIS — R208 Other disturbances of skin sensation: Secondary | ICD-10-CM | POA: Diagnosis present
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
## 2017-05-08 ENCOUNTER — Other Ambulatory Visit: Payer: Self-pay | Admitting: Nurse Practitioner

## 2017-05-08 MED ORDER — LOSARTAN POTASSIUM 50 MG PO TABS
50.0000 mg | ORAL_TABLET | Freq: Two times a day (BID) | ORAL | 3 refills | Status: DC
Start: 2017-05-08 — End: 2017-11-16

## 2017-05-16 ENCOUNTER — Encounter: Payer: Self-pay | Admitting: Nurse Practitioner

## 2017-05-16 ENCOUNTER — Other Ambulatory Visit: Payer: Self-pay | Admitting: Physician Assistant

## 2017-05-16 ENCOUNTER — Other Ambulatory Visit: Payer: Self-pay | Admitting: Nurse Practitioner

## 2017-05-16 DIAGNOSIS — I10 Essential (primary) hypertension: Secondary | ICD-10-CM

## 2017-05-16 MED ORDER — CHLORTHALIDONE 25 MG PO TABS: 12.5000 mg | ORAL_TABLET | Freq: Every day | ORAL | 3 refills | Status: DC

## 2017-05-26 ENCOUNTER — Ambulatory Visit
Payer: PRIVATE HEALTH INSURANCE | Attending: Otolaryngology | Admitting: Rehabilitative and Restorative Service Providers"

## 2017-05-26 DIAGNOSIS — R42 Dizziness and giddiness: Secondary | ICD-10-CM | POA: Diagnosis not present

## 2017-05-26 DIAGNOSIS — R208 Other disturbances of skin sensation: Secondary | ICD-10-CM | POA: Insufficient documentation
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
## 2017-05-26 NOTE — Patient Instructions (Addendum)
1) Tennis BJ'sBall Roll: Place tennis ball at level of tightness and lean against it at a wall.  Move to your tolerance for trigger point release.  2) child's pose   THREAD THE RIGHT ARM UNDER THE LEFT ARM AND PLACE YOUR RIGHT CHEEK ON THE FLOOR/BED. Hold for 30 seconds.  Rest and repeat 2 more times. Copyright  VHI. All rights reserved.   3) Flexors, Supine    Lie on back, head on small, rolled towel. Nod head, tipping chin down. Tighten muscles in back of throat. Hold _5__ seconds. Repeat _10__ times per session. Do ___ sessions per day. 2 Copyright  VHI. All rights reserved.   4) Door frame stretching *see patient's pictures on her iphone.   Ice Massage and Cold Packs Home Program  Ice and cold packs are used so that we can return the muscle to it's natural resting state without causing more pain, which can lead to more spasm, etc.  Icing and cold packs are also used to reduce swelling, which can lead to pain and stiffness.  COLD PACKS Cold packs should be placed circumferentially around the swollen area (ie., the wrist, etc.).  A paper towel can be placed over the area before the cold pack is applied.  A towel may be wrapped around the outside of the cold pack to keep the cold in.  The swollen area should be elevated with the cold pack, if possible.  ICE MASSAGE Fill a 4 ounce paper cup three-quarters full and put it in a freezer until it is frozen.  When ready to use, tear off about 1 inch of the cup so that some of the ice is showing while the bottom of the cup can be used to hold onto. Massage the entire muscle area as instructed by your therapist.  You may use circular or up and down strokes, but do not hold the ice in one spot.  Four phases to the ice massage and cold pack application: 1. Cold: which you feel when you first apply the ice. 2. Ache: after a few minutes 3. Burning: after approximately five minutes, it will feel like your skin is burning.  At this point, remove  the ice for a minute or so. 4. Numbness: THIS IS THE CRUCIAL PHASE!!! Return the ice or cold pack and massage until all the burning disappears.  This signals the end of cryotherapy.  The entire procedure should take ten to fifteen minutes while using the cold pack.  Do Not perform the ice massage for more than seven minutes on a small area or more than ten minutes on a large area.  Cryotherapy should be performed after exercises, when edema occurs, or when an area is painful.

## 2017-05-29 ENCOUNTER — Ambulatory Visit: Payer: PRIVATE HEALTH INSURANCE | Admitting: Physical Therapy

## 2017-05-29 ENCOUNTER — Encounter: Payer: Self-pay | Admitting: Physical Therapy

## 2017-05-29 DIAGNOSIS — R208 Other disturbances of skin sensation: Secondary | ICD-10-CM

## 2017-05-29 DIAGNOSIS — R42 Dizziness and giddiness: Secondary | ICD-10-CM | POA: Diagnosis not present
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
## 2017-05-30 ENCOUNTER — Ambulatory Visit: Payer: PRIVATE HEALTH INSURANCE | Admitting: Physician Assistant

## 2017-06-02 ENCOUNTER — Encounter: Payer: Self-pay | Admitting: Physical Therapy

## 2017-06-02 ENCOUNTER — Ambulatory Visit: Payer: PRIVATE HEALTH INSURANCE | Admitting: Physical Therapy

## 2017-06-02 DIAGNOSIS — R42 Dizziness and giddiness: Secondary | ICD-10-CM

## 2017-06-02 DIAGNOSIS — R208 Other disturbances of skin sensation: Secondary | ICD-10-CM

## 2017-06-02 NOTE — Patient Instructions (Addendum)
Random Direction Head Motion    Perform without assistive device. Walking on solid surface, have person cue you to move head and eyes in random directions every __3-4__ steps. Repeat sequence _4___ times per session. Do __2__ sessions per day. Repeat in dimly lit room or on thick carpet.  Copyright  VHI. All rights reserved.
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
## 2017-06-05 ENCOUNTER — Ambulatory Visit: Payer: PRIVATE HEALTH INSURANCE | Admitting: Physical Therapy

## 2017-06-05 ENCOUNTER — Encounter: Payer: Self-pay | Admitting: Physical Therapy

## 2017-06-05 DIAGNOSIS — R42 Dizziness and giddiness: Secondary | ICD-10-CM

## 2017-06-05 DIAGNOSIS — R208 Other disturbances of skin sensation: Secondary | ICD-10-CM
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
## 2017-06-07 ENCOUNTER — Encounter: Payer: PRIVATE HEALTH INSURANCE | Admitting: Physical Therapy

## 2017-06-13 ENCOUNTER — Encounter: Payer: PRIVATE HEALTH INSURANCE | Admitting: Physical Therapy

## 2017-06-16 ENCOUNTER — Encounter: Payer: PRIVATE HEALTH INSURANCE | Admitting: Physical Therapy

## 2017-06-19 ENCOUNTER — Encounter: Payer: Self-pay | Admitting: Physical Therapy

## 2017-06-19 ENCOUNTER — Ambulatory Visit: Payer: PRIVATE HEALTH INSURANCE | Attending: Otolaryngology | Admitting: Physical Therapy

## 2017-06-19 DIAGNOSIS — R42 Dizziness and giddiness: Secondary | ICD-10-CM

## 2017-06-19 DIAGNOSIS — M546 Pain in thoracic spine: Secondary | ICD-10-CM | POA: Insufficient documentation

## 2017-06-19 DIAGNOSIS — R208 Other disturbances of skin sensation: Secondary | ICD-10-CM | POA: Insufficient documentation

## 2017-06-19 NOTE — Patient Instructions (Signed)
Habituation - Tip Card  1.The goal of habituation training is to assist in decreasing symptoms of vertigo, dizziness, or nausea provoked by specific head and body motions. 2.These exercises may initially increase symptoms; however, be persistent and work through symptoms. With repetition and time, the exercises will assist in reducing or eliminating symptoms. 3.Exercises should be stopped and discussed with the therapist if you experience any of the following: - Sudden change or fluctuation in hearing - New onset of ringing in the ears, or increase in current intensity - Any fluid discharge from the ear - Severe pain in neck or back - Extreme nausea    Habituation - Sit to Side-Lying   Sit on edge of bed. Turn head to left, Lie down onto the right side and hold until dizziness stops, plus 20 seconds.  Return to sitting and wait until dizziness stops, plus 20 seconds.  Repeat, turn head to right and lie down to the left side. Repeat sequence 5 times per session.   Copyright  VHI. All rights reserved.
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
## 2017-06-20 ENCOUNTER — Ambulatory Visit: Payer: PRIVATE HEALTH INSURANCE | Admitting: Physical Therapy

## 2017-06-20 ENCOUNTER — Encounter: Payer: Self-pay | Admitting: Physical Therapy

## 2017-06-20 DIAGNOSIS — R42 Dizziness and giddiness: Secondary | ICD-10-CM

## 2017-06-20 DIAGNOSIS — R208 Other disturbances of skin sensation: Secondary | ICD-10-CM

## 2017-06-20 DIAGNOSIS — M546 Pain in thoracic spine: Secondary | ICD-10-CM
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
## 2017-06-23 ENCOUNTER — Encounter: Payer: PRIVATE HEALTH INSURANCE | Admitting: Rehabilitative and Restorative Service Providers"

## 2017-06-26 ENCOUNTER — Encounter: Payer: PRIVATE HEALTH INSURANCE | Admitting: Physical Therapy

## 2017-06-28 ENCOUNTER — Encounter: Payer: PRIVATE HEALTH INSURANCE | Admitting: Rehabilitative and Restorative Service Providers"

## 2017-06-28 ENCOUNTER — Telehealth: Payer: Self-pay | Admitting: Nurse Practitioner

## 2017-06-28 ENCOUNTER — Encounter: Payer: Self-pay | Admitting: Nurse Practitioner

## 2017-06-28 DIAGNOSIS — I10 Essential (primary) hypertension: Secondary | ICD-10-CM

## 2017-06-28 DIAGNOSIS — E871 Hypo-osmolality and hyponatremia: Secondary | ICD-10-CM

## 2017-06-28 NOTE — Telephone Encounter (Signed)
New message     Pt is calling about a urinalysis test that was suppose to be done today,  Is she to come to office to have it done or [facility]?

## 2017-06-28 NOTE — Telephone Encounter (Signed)
Follow up    Pt is calling back about this.

## 2017-06-28 NOTE — Telephone Encounter (Signed)
Returned call, no answer, lmtcb.  Advised unsure what orders are needed and am trying to get clarified.  Advised I would return call when I am aware of what to order.

## 2017-06-29 ENCOUNTER — Other Ambulatory Visit: Payer: Self-pay | Admitting: *Deleted

## 2017-06-29 ENCOUNTER — Encounter: Payer: PRIVATE HEALTH INSURANCE | Admitting: Physical Therapy

## 2017-06-29 ENCOUNTER — Ambulatory Visit: Payer: PRIVATE HEALTH INSURANCE | Admitting: Physical Therapy

## 2017-06-29 DIAGNOSIS — R42 Dizziness and giddiness: Secondary | ICD-10-CM

## 2017-06-29 DIAGNOSIS — E871 Hypo-osmolality and hyponatremia: Secondary | ICD-10-CM

## 2017-06-29 DIAGNOSIS — I10 Essential (primary) hypertension: Secondary | ICD-10-CM

## 2017-06-29 DIAGNOSIS — R208 Other disturbances of skin sensation: Secondary | ICD-10-CM

## 2017-06-29 DIAGNOSIS — M546 Pain in thoracic spine: Secondary | ICD-10-CM

## 2017-06-29 LAB — BASIC METABOLIC PANEL
BUN/Creatinine Ratio: 24 (ref 12–28)
BUN: 13 mg/dL (ref 8–27)
CHLORIDE: 93 mmol/L — AB (ref 96–106)
CO2: 27 mmol/L (ref 20–29)
CREATININE: 0.54 mg/dL — AB (ref 0.57–1.00)
Calcium: 9.3 mg/dL (ref 8.7–10.3)
GFR calc Af Amer: 117 mL/min/{1.73_m2} (ref >59)
GFR calc non Af Amer: 101 mL/min/{1.73_m2} (ref >59)
GLUCOSE: 88 mg/dL (ref 65–99)
Potassium: 4.6 mmol/L (ref 3.5–5.2)
Sodium: 135 mmol/L (ref 134–144)

## 2017-06-29 NOTE — Telephone Encounter (Signed)
Please see prior my char note. [facility] DX: HTN and hyponatremia.

## 2017-06-29 NOTE — Telephone Encounter (Signed)
Orders entered for UA and Bmet for clinic collect.

## 2017-06-29 NOTE — Telephone Encounter (Signed)
Patient made aware and will have this collected today.

## 2017-06-30 ENCOUNTER — Encounter: Payer: PRIVATE HEALTH INSURANCE | Admitting: Rehabilitative and Restorative Service Providers"

## 2017-06-30 LAB — URINALYSIS
Bilirubin, UA: NEGATIVE
GLUCOSE, UA: NEGATIVE
Ketones, UA: NEGATIVE
Leukocytes, UA: NEGATIVE
Nitrite, UA: NEGATIVE
PH UA: 6.5 (ref 5.0–7.5)
Protein, UA: NEGATIVE
RBC, UA: NEGATIVE
Specific Gravity, UA: 1.009 (ref 1.005–1.030)
UUROB: 0.2 mg/dL (ref 0.2–1.0)
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
## 2017-07-05 ENCOUNTER — Ambulatory Visit: Payer: PRIVATE HEALTH INSURANCE | Admitting: Rehabilitative and Restorative Service Providers"

## 2017-07-06 ENCOUNTER — Ambulatory Visit: Payer: PRIVATE HEALTH INSURANCE | Admitting: Physical Therapy

## 2017-07-06 ENCOUNTER — Encounter: Payer: Self-pay | Admitting: Physical Therapy

## 2017-07-06 DIAGNOSIS — R42 Dizziness and giddiness: Secondary | ICD-10-CM

## 2017-07-06 DIAGNOSIS — M546 Pain in thoracic spine: Secondary | ICD-10-CM

## 2017-07-06 DIAGNOSIS — R208 Other disturbances of skin sensation: Secondary | ICD-10-CM

## 2017-07-06 NOTE — Patient Instructions (Signed)
Pt shown bil shoulder extension isometric into wall with scapular squeeze engaging serratus anterior and lower trap.  Stand with back against wall 15 times   3 x a week  Rita LahLawrie Berry, PT Certified Exercise Expert for the Aging Adult  07/06/17 12:36 PM Phone: [phone] Fax: [phone]
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
## 2017-07-10 ENCOUNTER — Encounter: Payer: Self-pay | Admitting: Physical Therapy

## 2017-07-12 ENCOUNTER — Encounter: Payer: PRIVATE HEALTH INSURANCE | Admitting: Physical Therapy

## 2017-07-14 ENCOUNTER — Encounter: Payer: Self-pay | Admitting: Physical Therapy

## 2017-07-14 ENCOUNTER — Ambulatory Visit: Payer: PRIVATE HEALTH INSURANCE | Admitting: Physical Therapy

## 2017-07-14 DIAGNOSIS — M546 Pain in thoracic spine: Secondary | ICD-10-CM

## 2017-07-14 DIAGNOSIS — R42 Dizziness and giddiness: Secondary | ICD-10-CM

## 2017-07-14 DIAGNOSIS — R208 Other disturbances of skin sensation: Secondary | ICD-10-CM
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
## 2017-07-20 ENCOUNTER — Ambulatory Visit
Payer: PRIVATE HEALTH INSURANCE | Attending: Otolaryngology | Admitting: Rehabilitative and Restorative Service Providers"

## 2017-07-20 DIAGNOSIS — R42 Dizziness and giddiness: Secondary | ICD-10-CM | POA: Insufficient documentation

## 2017-07-20 DIAGNOSIS — M546 Pain in thoracic spine: Secondary | ICD-10-CM | POA: Insufficient documentation

## 2017-07-20 DIAGNOSIS — R208 Other disturbances of skin sensation: Secondary | ICD-10-CM | POA: Insufficient documentation
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
## 2017-07-20 NOTE — Patient Instructions (Addendum)
1) Tennis BJ'sBall Roll: Place tennis ball at level of tightness and lean against it at a wall.  Move to your tolerance for trigger point release.  2) child's pose   THREAD THE RIGHT ARM UNDER THE LEFT ARM AND PLACE YOUR RIGHT CHEEK ON THE FLOOR/BED. Hold for 30 seconds.  Rest and repeat 2 more times. Copyright  VHI. All rights reserved.   3) Flexors, Supine    Lie on back, head on small, rolled towel. Nod head, tipping chin down. Tighten muscles in back of throat. Hold _5__ seconds. Repeat _10__ times per session. Do ___ sessions per day. 2 Copyright  VHI. All rights reserved.   4) Door frame stretching *see patient's pictures on her iphone.  Scapular Retraction: Flexion (Prone)    Lie with arms forward. Pinch shoulder blades together and raise arms a few inches from floor. Repeat __10__ times per set. Do __2__ sets per session. Do __2__ sessions per day.  http://orth.exer.us/961   Copyright  VHI. All rights reserved.   Ball Roll Back, Supine: Advanced    Sit on therapy ball. Slide down, stretching middle back over ball. Hold _60__ seconds. Repeat __3_ times per session. Do _2__ sessions per day.  Copyright  VHI. All rights reserved.   Rib: Inferior Glide / Cervical Rotation    Holding towel or strap on top of right shoulder close to base of neck, pull down, rotating head to same side. Repeat __5__ times per set.  Do __2__ sessions per week.  Copyright  VHI. All rights reserved.

## 2017-07-24 ENCOUNTER — Encounter: Payer: Self-pay | Admitting: Physical Therapy

## 2017-07-24 ENCOUNTER — Ambulatory Visit: Payer: PRIVATE HEALTH INSURANCE | Admitting: Physical Therapy

## 2017-07-24 DIAGNOSIS — R42 Dizziness and giddiness: Secondary | ICD-10-CM

## 2017-07-24 DIAGNOSIS — R208 Other disturbances of skin sensation: Secondary | ICD-10-CM

## 2017-07-24 DIAGNOSIS — M546 Pain in thoracic spine: Secondary | ICD-10-CM

## 2017-07-24 NOTE — Therapy (Signed)
[facility] [address] [place], [place], [address] Phone: [phone]   Fax:  [phone]  Physical Therapy Treatment  Patient Details  Name: Rita Berry MRN: [id number] Date of Birth: 03/19/1956 Referring Provider: Christia Reading, MD  Encounter Date: 07/24/2017      PT End of Session - 07/24/17 1523    Visit Number 12   Number of Visits 25   Date for PT Re-Evaluation 08/04/17   PT Start Time 1503   PT Stop Time 1545   PT Time Calculation (min) 42 min   Activity Tolerance Patient tolerated treatment well   Behavior During Therapy [facility] for tasks assessed/performed      Past Medical History:  Diagnosis Date  . Arthritis    hip right   . CIN I (cervical intraepithelial neoplasia I)   . Complication of anesthesia   . Essential hypertension   . Humerus distal fracture    left-08-16-16 surgery to repair  . Ovarian cyst   . PONV (postoperative nausea and vomiting)   . Pre-syncope    a. ? orthostasis/dehydration 03/2017.  Marland Kitchen Vertigo    "Positional vertigo"- head elevation best    Past Surgical History:  Procedure Laterality Date  . ABDOMINAL HYSTERECTOMY    . ABDOMINAL SURGERY     Ovarian cyst  . COLPOSCOPY    . GYNECOLOGIC CRYOSURGERY    . OOPHORECTOMY     BSO  . ORIF HUMERUS FRACTURE Left 08/16/2016   Procedure: OPEN TREATMENT OF LEFT DISTAL HUMERUS FRACTURE;  Surgeon: Mack Hook, MD;  Location: [facility];  Service: Orthopedics;  Laterality: Left;  GENERAL ANESTHESIA WITH PRE-OP BLOCK  . TOTAL HIP ARTHROPLASTY Right 10/18/2016   Procedure: RIGHT TOTAL HIP ARTHROPLASTY ANTERIOR APPROACH;  Surgeon: Durene Romans, MD;  Location[facility];  Service: Orthopedics;  Laterality: Right;  . TUBAL LIGATION      There were no vitals filed for this visit.      Subjective Assessment - 07/24/17 1508    Subjective "I am doing much better, no vertigo. still feel tight in the neck but overall I am much"   Currently in Pain? No/denies                         [facility] Adult PT Treatment/Exercise - 07/24/17 0001      Neck Exercises: Supine   Other Supine Exercise thoracic rotation 2 x 10 with arms crossed the      Manual Therapy   Joint Mobilization 1st rib mobs on the Right grade 3 with breathing   McConnell R upper trap inhibition taping          Trigger Point Dry Needling - 07/24/17 1508    Consent Given? Yes   Education Handout Provided No   Muscles Treated Upper Body --   Upper Trapezius Response Twitch reponse elicited;Palpable increased muscle length  R only   Levator Scapulae Response Twitch response elicited;Palpable increased muscle length  R only   Longissimus Response Twitch response elicited;Palpable increased muscle length  C3 bil, R C4-C7                PT Short Term Goals - 05/29/17 1944      PT SHORT TERM GOAL #1   Title TARGET DATE FOR STG 05/26/17: Pt will participate in FGA for dynamic gait/balance assessment.   Status Achieved     PT SHORT TERM GOAL #2   Title Pt will participate  in SOT assessment to determine if pt has impaired use of various sensory systems   Status Achieved     PT SHORT TERM GOAL #3   Title Pt will report dizziness as <4/10 when performing neck/postural ROM/strengthening and proprioceptive exercises   Status Achieved           PT Long Term Goals - 06/20/17 1548      PT LONG TERM GOAL #1   Title NEW TARGET DATE FOR ALL LTG IS 08/04/2017  Pt will demonstrate independence with cervico-thoracic strengthening/ROM/proprioception and vestibular HEP   Time 6   Period Weeks   Status On-going     PT LONG TERM GOAL #2   Title Pt will improve FGA to >28/30 for decreased falls risk during community gait   Baseline 28/30   Time 6   Period Weeks   Status On-going     PT LONG TERM GOAL #3   Title Pt will improve SOT composite score by 8 points for improved use of all sensory systems for balance   Baseline 72 but  improved use of vestibular system   Time 6   Period Weeks   Status On-going     PT LONG TERM GOAL #4   Title Pt will improve DHI score by 18 points (34 at eval-target for DHI 16)   Baseline 18   Time 6   Period Weeks   Status On-going     PT LONG TERM GOAL #5   Title Pt will report <2/10 dizziness when performing cervical/thoracic and vestibular exercises   Baseline 2-3/10   Time 6   Period Weeks   Status On-going               Plan - 07/24/17 1523    Clinical Impression Statement pt reports no pain today but continues to have tightness in the neck today. Continued DN in ther cervical paraspinals, levator scap, upper trap, and rhomboids. Performed IASTM techniques and rib mobs. she was reported decreased pain / tightness following thoracic mobility exercise. pt declined modalities post session.    PT Next Visit Plan assess response to DN, check HEP, postural stabilization.   NEURO PT:  check related LTGs to d/c dizzy/balance goals as no current c/o imbalance + dizziness   PT Home Exercise Plan 1st rib mobs, thoracic rotation      Patient will benefit from skilled therapeutic intervention in order to improve the following deficits and impairments:  Dizziness, Impaired sensation, Pain, Decreased strength, Decreased range of motion, Decreased balance, Postural dysfunction  Visit Diagnosis: Dizziness and giddiness  Other disturbances of skin sensation  Pain in thoracic spine     Problem List Patient Active Problem List   Diagnosis Date Noted  . 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 Osteoarthritis of right hip 06/14/2016  . Essential hypertension 06/12/2015  . Diverticulosis 06/12/2015  . Pain in joint, ankle and foot 09/16/2014  . Bilateral bunions 09/16/2014  . Iliotibial band syndrome of left side 10/30/2013  . Greater trochanteric bursitis of both hips 10/30/2013  . Abnormality of gait 06/18/2013  . Eucalcemic Hyperparathyroidism 04/19/2013  . Osteopenia  04/19/2013  . CIN I (cervical intraepithelial neoplasia I)   . Ovarian cyst    Lulu RidingKristoffer Brynnley Dayrit PT, DPT, LAT, ATC  07/24/17  4:05 PM      [facility] [address] [place], [place], [address] Phone: [phone]   Fax:  [phone]  Name: Rita Berry MRN: [id number] Date of  Birth: [birth year]

## 2017-07-26 ENCOUNTER — Ambulatory Visit: Payer: PRIVATE HEALTH INSURANCE | Admitting: Rehabilitative and Restorative Service Providers"

## 2017-08-01 ENCOUNTER — Encounter: Payer: PRIVATE HEALTH INSURANCE | Admitting: Physical Therapy

## 2017-08-04 ENCOUNTER — Encounter: Payer: PRIVATE HEALTH INSURANCE | Admitting: Rehabilitative and Restorative Service Providers"

## 2017-08-08 ENCOUNTER — Encounter: Payer: Self-pay | Admitting: Physical Therapy

## 2017-08-08 ENCOUNTER — Encounter: Payer: PRIVATE HEALTH INSURANCE | Admitting: Rehabilitative and Restorative Service Providers"

## 2017-08-08 ENCOUNTER — Ambulatory Visit: Payer: PRIVATE HEALTH INSURANCE | Admitting: Physical Therapy

## 2017-08-08 DIAGNOSIS — R42 Dizziness and giddiness: Secondary | ICD-10-CM | POA: Diagnosis not present

## 2017-08-08 DIAGNOSIS — M546 Pain in thoracic spine: Secondary | ICD-10-CM

## 2017-08-08 DIAGNOSIS — R208 Other disturbances of skin sensation: Secondary | ICD-10-CM

## 2017-08-08 NOTE — Therapy (Signed)
[place], [place], [address] Phone: [phone]   Fax:  [phone]  Physical Therapy Treatment / Re-certification  Patient Details  Name: Rita Berry MRN: [id number] Date of Birth: Apr 09, 1956 Referring Provider: Melida Quitter, MD  Encounter Date: 08/08/2017      PT End of Session - 08/08/17 1425    Visit Number 13   Number of Visits 25   Date for PT Re-Evaluation 09/05/17   Authorization Type [employer] 20% co-insurance, 0 visit limit, 0 auth required   PT Start Time 1422   PT Stop Time 1502   PT Time Calculation (min) 40 min   Activity Tolerance Patient tolerated treatment well   Behavior During Therapy [facility] for tasks assessed/performed      Past Medical History:  Diagnosis Date  . Arthritis    hip right   . CIN I (cervical intraepithelial neoplasia I)   . Complication of anesthesia   . Essential hypertension   . Humerus distal fracture    left-08-16-16 surgery to repair  . Ovarian cyst   . PONV (postoperative nausea and vomiting)   . Pre-syncope    a. ? orthostasis/dehydration 03/2017.  Marland Kitchen Vertigo    "Positional vertigo"- head elevation best    Past Surgical History:  Procedure Laterality Date  . ABDOMINAL HYSTERECTOMY    . ABDOMINAL SURGERY     Ovarian cyst  . COLPOSCOPY    . GYNECOLOGIC CRYOSURGERY    . OOPHORECTOMY     BSO  . ORIF HUMERUS FRACTURE Left 08/16/2016   Procedure: OPEN TREATMENT OF LEFT DISTAL HUMERUS FRACTURE;  Surgeon: Milly Jakob, MD;  Location: [place];  Service: Orthopedics;  Laterality: Left;  GENERAL ANESTHESIA WITH PRE-OP BLOCK  . TOTAL HIP ARTHROPLASTY Right 10/18/2016   Procedure: RIGHT TOTAL HIP ARTHROPLASTY ANTERIOR APPROACH;  Surgeon: Paralee Cancel, MD;  [facility];  Service: Orthopedics;  Laterality: Right;  . TUBAL LIGATION      There were no vitals filed for this visit.      Subjective Assessment - 08/08/17 1427    Subjective  "The pain has improve since the last session,    Currently in Pain? Yes   Pain Score 0-No pain  3/10 with pressure   Pain Orientation Right   Pain Type Chronic pain   Aggravating Factors  N/A   Pain Relieving Factors N/A            [facility] PT Assessment - 08/08/17 1429      AROM   Cervical Flexion 80   Cervical Extension 68   Cervical - Right Side Bend 50   Cervical - Left Side Bend 50   Cervical - Right Rotation 80   Cervical - Left Rotation 76                     [facility] Adult PT Treatment/Exercise - 08/08/17 1439      Self-Care   Other Self-Care Comments  manual trigger point release using theracane     Neck Exercises: Sidelying   Other Sidelying Exercise book opening 2 x 10 bil     Shoulder Exercises: Seated   Other Seated Exercises thoracic extension over the back of the chair   cues to keep the chin tucked for proper form     Manual Therapy   Manual therapy comments sub-occipital release x 5 min, manual trigger point release over the Rupper trap and how to perofrm at home  how to perform at home                PT Education - 08/08/17 1501    Education provided Yes   Education Details manual trigger point release techniques and sub-occipital release to relieve muscle tension   Person(s) Educated Patient   Methods Explanation;Verbal cues   Comprehension Verbalized understanding;Verbal cues required          PT Short Term Goals - 05/29/17 1944      PT SHORT TERM GOAL #1   Title TARGET DATE FOR STG 05/26/17: Pt will participate in FGA for dynamic gait/balance assessment.   Status Achieved     PT SHORT TERM GOAL #2   Title Pt will participate in SOT assessment to determine if pt has impaired use of various sensory systems   Status Achieved     PT SHORT TERM GOAL #3   Title Pt will report dizziness as <4/10 when performing neck/postural ROM/strengthening and proprioceptive exercises   Status Achieved           PT Long Term Goals  - 08/08/17 1503      PT LONG TERM GOAL #1   Title NEW TARGET DATE FOR ALL LTG IS 08/04/2017  Pt will demonstrate independence with cervico-thoracic strengthening/ROM/proprioception and vestibular HEP   Time 6   Period Weeks   Status Partially Met     PT LONG TERM GOAL #2   Title Pt will improve FGA to >28/30 for decreased falls risk during community gait   Time 6   Period Weeks   Status Unable to assess     PT LONG TERM GOAL #3   Title Pt will improve SOT composite score by 8 points for improved use of all sensory systems for balance   Time 6   Period Weeks   Status Unable to assess     PT LONG TERM GOAL #4   Title Pt will improve DHI score by 18 points (34 at eval-target for [place] 16)   Time 6   Period Weeks   Status Unable to assess     PT LONG TERM GOAL #5   Title Pt will report <2/10 dizziness when performing cervical/thoracic and vestibular exercises   Time 6   Period Weeks   Status Achieved     Additional Long Term Goals   Additional Long Term Goals Yes     PT LONG TERM GOAL #6   Title pt will demonstrate functional mobility in all cervical planes report no pain or tightness to promote safety with driving and ADLs    Time 3   Period Weeks   Status New               Plan - 08/08/17 1502    Clinical Impression Statement Focused todays session on treatment that can be performed athome opting to hold off on DN. She has made significant improvements in cevical mobility. educated on manual trigger point release and sub-occipital release techniques. she has progressed well in a short period of time and may trial 1-2 weeks away from PT to assess HEP. Plan to continue with current POC to work to remaining goals and independent exercise.    PT Frequency 2x / week   PT Duration 4 weeks   PT Treatment/Interventions ADLs/Self Care Home Management;Canalith Repostioning;Cryotherapy;Electrical Stimulation;Moist Heat;Traction;Therapeutic activities;Therapeutic exercise;Balance  training;Neuromuscular re-education;Patient/family education;Manual techniques;Passive range of motion;Taping;Vestibular;Dry needling   PT Next Visit Plan DN PRN, check HEP, postural stabilization.  thoracic foam roll  routine   Consulted and Agree with Plan of Care Patient      Patient will benefit from skilled therapeutic intervention in order to improve the following deficits and impairments:     Visit Diagnosis: Dizziness and giddiness - Plan: PT plan of care cert/re-cert  Other disturbances of skin sensation - Plan: PT plan of care cert/re-cert  Pain in thoracic spine - Plan: PT plan of care cert/re-cert     Problem List Patient Active Problem List   Diagnosis Date Noted  . 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 Osteoarthritis of right hip 06/14/2016  . Essential hypertension 06/12/2015  . Diverticulosis 06/12/2015  . Pain in joint, ankle and foot 09/16/2014  . Bilateral bunions 09/16/2014  . Iliotibial band syndrome of left side 10/30/2013  . Greater trochanteric bursitis of both hips 10/30/2013  . Abnormality of gait 06/18/2013  . Eucalcemic Hyperparathyroidism 04/19/2013  . Osteopenia 04/19/2013  . CIN I (cervical intraepithelial neoplasia I)   . Ovarian cyst    Starr Lake PT, DPT, LAT, ATC  08/08/17  3:09 PM      [facility] [facility] [address] [place], [place], [address] Phone: [phone]   Fax:  [phone]  Name: Rita Berry MRN: [id number] Date of Birth: 06/01/1956

## 2017-08-10 ENCOUNTER — Ambulatory Visit: Payer: PRIVATE HEALTH INSURANCE | Admitting: Physical Therapy

## 2017-08-14 ENCOUNTER — Ambulatory Visit: Payer: PRIVATE HEALTH INSURANCE | Admitting: Physical Therapy

## 2017-08-16 ENCOUNTER — Encounter: Payer: PRIVATE HEALTH INSURANCE | Admitting: Physical Therapy

## 2017-08-18 ENCOUNTER — Ambulatory Visit: Payer: PRIVATE HEALTH INSURANCE | Admitting: Physical Therapy

## 2017-08-18 ENCOUNTER — Encounter: Payer: Self-pay | Admitting: Physical Therapy

## 2017-08-18 DIAGNOSIS — M546 Pain in thoracic spine: Secondary | ICD-10-CM

## 2017-08-18 DIAGNOSIS — R208 Other disturbances of skin sensation: Secondary | ICD-10-CM

## 2017-08-18 DIAGNOSIS — R42 Dizziness and giddiness: Secondary | ICD-10-CM

## 2017-08-18 NOTE — Therapy (Signed)
[place], [place], [address] Phone: [phone]   Fax:  [phone]  Physical Therapy Treatment / Discharge Summary  Patient Details  Name: Rita Berry MRN: [id number] Date of Birth: 1956-11-26 Referring Provider: Melida Quitter, MD  Encounter Date: 08/18/2017      PT End of Session - 08/18/17 0843    Visit Number 14   Number of Visits 25   Date for PT Re-Evaluation 09/05/17   PT Start Time 0805   PT Stop Time 0843   PT Time Calculation (min) 38 min   Activity Tolerance Patient tolerated treatment well   Behavior During Therapy [facility] for tasks assessed/performed      Past Medical History:  Diagnosis Date  . Arthritis    hip right   . CIN I (cervical intraepithelial neoplasia I)   . Complication of anesthesia   . Essential hypertension   . Humerus distal fracture    left-08-16-16 surgery to repair  . Ovarian cyst   . PONV (postoperative nausea and vomiting)   . Pre-syncope    a. ? orthostasis/dehydration 03/2017.  Marland Kitchen Vertigo    "Positional vertigo"- head elevation best    Past Surgical History:  Procedure Laterality Date  . ABDOMINAL HYSTERECTOMY    . ABDOMINAL SURGERY     Ovarian cyst  . COLPOSCOPY    . GYNECOLOGIC CRYOSURGERY    . OOPHORECTOMY     BSO  . ORIF HUMERUS FRACTURE Left 08/16/2016   Procedure: OPEN TREATMENT OF LEFT DISTAL HUMERUS FRACTURE;  Surgeon: Milly Jakob, MD;  Location: [facility];  Service: Orthopedics;  Laterality: Left;  GENERAL ANESTHESIA WITH PRE-OP BLOCK  . TOTAL HIP ARTHROPLASTY Right 10/18/2016   Procedure: RIGHT TOTAL HIP ARTHROPLASTY ANTERIOR APPROACH;  Surgeon: Paralee Cancel, MD;  Location: [facility];  Service: Orthopedics;  Laterality: Right;  . TUBAL LIGATION      There were no vitals filed for this visit.      Subjective Assessment - 08/18/17 0807    Subjective "I feel like I am doing good, and I got a theracane and I think I am still  moving better"    Currently in Pain? Yes   Pain Score 0-No pain  with touching 4/10   Pain Location Shoulder   Pain Orientation Right   Pain Type Chronic pain   Aggravating Factors  N/A   Pain Relieving Factors N/A            [facility] PT Assessment - 08/18/17 0836      AROM   Cervical Flexion 82   Cervical Extension 70   Cervical - Right Side Bend 58   Cervical - Left Side Bend 58   Cervical - Right Rotation 80   Cervical - Left Rotation 80                     [facility] Adult PT Treatment/Exercise - 08/18/17 0813      Neck Exercises: Supine   Other Supine Exercise supine foam roll routine, ceiling punches, horizontal add, abd, alternating ceiling punches, snow angels, back stroke 2 x 12 ea.     Shoulder Exercises: ROM/Strengthening   UBE (Upper Arm Bike) L1 x 4 min   changing direction at 2 min     Manual Therapy   Manual therapy comments manual trigger point release over the Rupper trap and how to perofrm at home  PT Education - 08/18/17 [phone]    Education provided Yes   Education Details foam roll routine and benefits of increasing repetition of stretching to combat any tightness.   Person(s) Educated Patient   Methods Explanation;Verbal cues;Handout   Comprehension Verbalized understanding;Verbal cues required          PT Short Term Goals - 05/29/17 1944      PT SHORT TERM GOAL #1   Title TARGET DATE FOR STG 05/26/17: Pt will participate in FGA for dynamic gait/balance assessment.   Status Achieved     PT SHORT TERM GOAL #2   Title Pt will participate in SOT assessment to determine if pt has impaired use of various sensory systems   Status Achieved     PT SHORT TERM GOAL #3   Title Pt will report dizziness as <4/10 when performing neck/postural ROM/strengthening and proprioceptive exercises   Status Achieved           PT Long Term Goals - 08/18/17 0854      PT LONG TERM GOAL #1   Title NEW TARGET DATE FOR ALL LTG IS  08/04/2017  Pt will demonstrate independence with cervico-thoracic strengthening/ROM/proprioception and vestibular HEP   Time 6   Period Weeks   Status Partially Met     PT LONG TERM GOAL #2   Title Pt will improve FGA to >28/30 for decreased falls risk during community gait   Time 6   Period Weeks   Status Unable to assess     PT LONG TERM GOAL #3   Title Pt will improve SOT composite score by 8 points for improved use of all sensory systems for balance   Time 6   Period Weeks   Status Unable to assess     PT LONG TERM GOAL #4   Title Pt will improve DHI score by 18 points (34 at eval-target for [place] 16)   Time 6   Period Weeks   Status Unable to assess     PT LONG TERM GOAL #5   Title Pt will report <2/10 dizziness when performing cervical/thoracic and vestibular exercises   Time 6   Period Weeks   Status Achieved     PT LONG TERM GOAL #6   Title pt will demonstrate functional mobility in all cervical planes report no pain or tightness to promote safety with driving and ADLs    Period Weeks   Status Achieved               Plan - 08/18/17 0844    Clinical Impression Statement pt reports intermittent tightness in the upper trap. she reports improvement iwth stretching and manual triggerpoint release and bought herself a theracane. Following foam roll routine she reported decreased pain in the shoulder. she has met or partially met goals set by orthopedic PT and reports she is able to maintain and progress her current level of function independently and will be discharged from PT today.    PT Next Visit Plan D/C   PT Home Exercise Plan 1st rib mobs, thoracic rotation, foam roll routine   Consulted and Agree with Plan of Care Patient      Patient will benefit from skilled therapeutic intervention in order to improve the following deficits and impairments:  Dizziness, Impaired sensation, Pain, Decreased strength, Decreased range of motion, Decreased balance, Postural  dysfunction  Visit Diagnosis: Dizziness and giddiness  Other disturbances of skin sensation  Pain in thoracic spine     Problem List  Patient Active Problem List   Diagnosis Date Noted  . 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 Osteoarthritis of right hip 06/14/2016  . Essential hypertension 06/12/2015  . Diverticulosis 06/12/2015  . Pain in joint, ankle and foot 09/16/2014  . Bilateral bunions 09/16/2014  . Iliotibial band syndrome of left side 10/30/2013  . Greater trochanteric bursitis of both hips 10/30/2013  . Abnormality of gait 06/18/2013  . Eucalcemic Hyperparathyroidism 04/19/2013  . Osteopenia 04/19/2013  . CIN I (cervical intraepithelial neoplasia I)   . Ovarian cyst    Starr Lake PT, DPT, LAT, ATC  08/18/17  9:19 AM       [facility] [address] [place], [place], [address] Phone: [phone]   Fax:  [phone]  Name: Rita Berry MRN: [id number] Date of Birth: 08-31-1956      PHYSICAL THERAPY DISCHARGE SUMMARY  Visits from Start of Care: 14  Current functional level related to goals / functional outcomes: See goals   Remaining deficits: Intermittent tightness in the upper trap that is relieved with stretching and exercise.    Education / Equipment: HEP, theraband, posture, manual trigger point release,   Plan: Patient agrees to discharge.  Patient goals were met. Patient is being discharged due to being pleased with the current functional level.  ?????    Alicyn Klann PT, DPT, LAT, ATC  08/18/17  9:21 AM

## 2017-08-26 ENCOUNTER — Other Ambulatory Visit: Payer: Self-pay | Admitting: Emergency Medicine

## 2017-10-09 ENCOUNTER — Ambulatory Visit (INDEPENDENT_AMBULATORY_CARE_PROVIDER_SITE_OTHER): Payer: PRIVATE HEALTH INSURANCE | Admitting: Sports Medicine

## 2017-10-09 ENCOUNTER — Encounter: Payer: Self-pay | Admitting: Sports Medicine

## 2017-10-09 VITALS — BP 118/72 | Ht 67.0 in | Wt 135.0 lb

## 2017-10-09 DIAGNOSIS — R29898 Other symptoms and signs involving the musculoskeletal system: Secondary | ICD-10-CM

## 2017-10-09 NOTE — Progress Notes (Signed)
   Subjective:    Patient ID: Rita Lowerseresa B Flegel, female    DOB: 09/24/1956, 61 y.o.   MRN: [id number]  HPI Rita Berry presents today with L hip pain. This has been going on for 2 months. No acute injury. She increased her walking activity to 7-8 miles of walking per day as her husband has increased cholesterol. Feels a tight sensation. Thought it was her piriformis, tried exercises for this and theracane, with some help. Also used compounded ointment of diclofenac, gabapentin, baclofen, lidocaine, and ketamine. Has also tried ice and heat. History of replacement of R hip 2/2 OA.    Review of Systems     Objective:   Physical Exam BP 118/72   Ht 5\' 7"  (1.702 m)   Wt 135 lb (61.2 kg)   BMI 21.14 kg/m    Well-developed, well-nourished. No acute distress. Sitting comfortable in exam room.  L hip: TTP over L iliac crest and gluteus medius muscles over posterior hip. Excellent hip ROM without pain. No groin pain. Significant hip weakness to resisted lateral lying raise. + Trendelenburg when standing on R leg.   Right hip: Smooth painless hip range of motion with a negative logroll. Good strength. Negative Trendelenburg.  Walking without a limp.      Assessment & Plan:  1. L pelvic stabilizer weakness: has established with [facility] and enjoys this for her R shoulder. Referred again for L hip pelvic stabilizer weakness. Follow up in 6 weeks to reassess.  2. Status post right total hip arthroplasty-doing well.  Loni MuseKate Timberlake, MD   Patient seen and evaluated with the resident. I agree with the above plan of care. Patient has noticeable weakness of her pelvic stabilizers on the left. She has done well with physical therapy in the past when referred for her cervical spine. I will refer her back over to [facility]. She has had excellent results with dry needling in the past as well and I'll defer the decision to dry needle her hip to the physical therapist. Patient will  follow-up with me in 6 weeks for reevaluation. I do not think imaging is needed at this time but if pain persists follow-up I would consider getting an x-ray of the left hip.

## 2017-11-16 ENCOUNTER — Ambulatory Visit: Payer: PRIVATE HEALTH INSURANCE | Admitting: Physician Assistant

## 2017-11-16 ENCOUNTER — Encounter: Payer: Self-pay | Admitting: Physician Assistant

## 2017-11-16 VITALS — BP 122/82 | HR 73 | Ht 67.0 in | Wt 135.0 lb

## 2017-11-16 DIAGNOSIS — Z79899 Other long term (current) drug therapy: Secondary | ICD-10-CM

## 2017-11-16 DIAGNOSIS — I10 Essential (primary) hypertension: Secondary | ICD-10-CM

## 2017-11-16 NOTE — Progress Notes (Signed)
Cardiology Office Note   Date:  11/16/2017   ID:  Rita Berry, DOB 1956-10-28, MRN [id number]  PCP:  Porfirio Oar, PA-C  Cardiologist: Dr. Swaziland, he was DOD at her initial visit 12/06/2016 Theodore Demark, PA-C 03/27/2017   History of Present Illness: Rita Berry is a 61 y.o. female with a history of HTN  To review BP management:  Initially pt on Lasix>>Chlorthalidone started>>dose decreased after NTG patch started Side effects continued>>switched to clonidine>>more side effects>>cards referral  Did not tolerate amlodipine 2.5 mg. Did not tolerate Lasix 20 mg 2016/2017 Did not tolerate clonidine Did not tolerate chlorthalidone at 25 mg qd Hydralazine mentioned in PCP note 12/09/2016 but do not see that she was prescribed it  She has tolerated lisinopril 5 mg>10 mg>10 mg am, 30 mg pm BP tends to spike in the early afternoon/evening; uncontrolled blood pressure makes her nervous Most of the time her side effect is dizziness, she has seen specialists with no cause found except BPPV  04/09 office visit, chlorthalidone 12.5 mg qod, lisinopril 15 mg am and 15 mg pm 05/02 office visit for dizziness and presyncope, lisinopril changed to losartan 25 mg bid and continue chlorthalidone 12.5 mg qod  Rita Berry presents for cardiology follow up.  She is doing a run/walk in the am, sometimes does another one in the afternoon. She feels this helps her BP stabilize. She is tolerating the losartan well. She does not feel she is having BP spikes like she was. No chest pain. She takes 50 mg Cozaar when she gets up, takes the chlorthalidone about 10 am, takes the losartan 25 mg at 1 pm.   She wants to check her cholesterol today, she has been fasting. She has not had any blood work recently.   Her mother's health was worsening, they called in Hospice a couple of weeks ago. She died Thanksgiving day. She is coping with that pretty well, knowing her mother is no longer in pain. Her father is  71, is devastated. Through all of that, her BP remained stable.  She is not having any chest pain. No DOE, orthopnea or PND. No LE edema.  Other than the recent family stress, she is doing very well.   Past Medical History:  Diagnosis Date  . Arthritis    hip right   . CIN I (cervical intraepithelial neoplasia I)   . Complication of anesthesia   . Essential hypertension   . Humerus distal fracture    left-08-16-16 surgery to repair  . Ovarian cyst   . PONV (postoperative nausea and vomiting)   . Pre-syncope    a. ? orthostasis/dehydration 03/2017.  Marland Kitchen Vertigo    "Positional vertigo"- head elevation best    Past Surgical History:  Procedure Laterality Date  . ABDOMINAL HYSTERECTOMY    . ABDOMINAL SURGERY     Ovarian cyst  . COLPOSCOPY    . GYNECOLOGIC CRYOSURGERY    . OOPHORECTOMY     BSO  . ORIF HUMERUS FRACTURE Left 08/16/2016   Procedure: OPEN TREATMENT OF LEFT DISTAL HUMERUS FRACTURE;  Surgeon: Mack Hook, MD;  Location: [facility];  Service: Orthopedics;  Laterality: Left;  GENERAL ANESTHESIA WITH PRE-OP BLOCK  . TOTAL HIP ARTHROPLASTY Right 10/18/2016   Procedure: RIGHT TOTAL HIP ARTHROPLASTY ANTERIOR APPROACH;  Surgeon: Durene Romans, MD;  Location: [facility];  Service: Orthopedics;  Laterality: Right;  . TUBAL LIGATION      Current Outpatient Medications  Medication Sig Dispense Refill  .  b complex vitamins tablet Take 1 tablet by mouth daily.    . Calcium-Magnesium 500-250 MG TABS Take 1 tablet by mouth 2 (two) times daily.    . chlorthalidone (HYGROTON) 25 MG tablet Take 0.5 tablets (12.5 mg total) by mouth daily. 30 tablet 3  . Cholecalciferol (VITAMIN D) 2000 units CAPS Take 2,000 Units by mouth daily.    . fluticasone (FLONASE) 50 MCG/ACT nasal spray Place 2 sprays into both nostrils daily. 16 g 6  . losartan (COZAAR) 25 MG tablet Take 1 tablet by mouth every evening.  3  . losartan (COZAAR) 50 MG tablet Take 1 tablet by mouth every morning.  3  .  Multiple Vitamin (MULTIVITAMIN) tablet Take 1 tablet by mouth daily. Reported on 06/14/2016     No current facility-administered medications for this visit.     Allergies:   Codeine and Latex    Social History:  The patient  reports that she quit smoking about 27 years ago. she has never used smokeless tobacco. She reports that she drinks about 5.4 oz of alcohol per week. She reports that she does not use drugs.   Family History:  The patient's family history includes Bradycardia in her mother; Dementia in her mother; Diabetes in her maternal grandmother and sister; Hyperlipidemia in her father; Hypertension in her father and mother; Stroke in her maternal grandfather and maternal grandmother.    ROS:  Please see the history of present illness. All other systems are reviewed and negative.    PHYSICAL EXAM: VS:  BP 122/82 (BP Location: Left Arm, Patient Position: Sitting, Cuff Size: Normal)   Pulse 73   Ht 5\' 7"  (1.702 m)   Wt 135 lb (61.2 kg)   BMI 21.14 kg/m  , BMI Body mass index is 21.14 kg/m. GEN: Well nourished, well developed, female in no acute distress  HEENT: normal for age  Neck: no JVD, no carotid bruit, no masses Cardiac: RRR; no murmur, no rubs, or gallops Respiratory:  clear to auscultation bilaterally, normal work of breathing GI: soft, nontender, nondistended, + BS MS: no deformity or atrophy; no edema; distal pulses are 2+ in all 4 extremities   Skin: warm and dry, no rash Neuro:  Strength and sensation are intact Psych: euthymic mood, full affect   EKG:  EKG is ordered today. The ekg ordered today demonstrates SR, HR 73, no ischemic changes   Recent Labs: 12/09/2016: TSH 3.060 04/17/2017: Hemoglobin 13.6; Platelets 236 06/29/2017: BUN 13; Creatinine, Ser 0.54; Potassium 4.6; Sodium 135    Lipid Panel    Component Value Date/Time   CHOL 225 (H) 06/14/2016 1651   TRIG 87 06/14/2016 1651   HDL 90 06/14/2016 1651   CHOLHDL 2.5 06/14/2016 1651   VLDL 17  06/14/2016 1651   LDLCALC 118 06/14/2016 1651     Wt Readings from Last 3 Encounters:  11/16/17 135 lb (61.2 kg)  10/09/17 135 lb (61.2 kg)  04/19/17 129 lb 12.8 oz (58.9 kg)     Other studies Reviewed: Additional studies/ records that were reviewed today include: Office notes and testing.  ASSESSMENT AND PLAN:  1.  Hypertension: Her blood pressure is the most stable it has been.  Feels she is finally on a regimen that is working for her.  Continue current therapy, spreading out the medications during the day to avoid side effects.  No further cardiac workup is indicated.   Current medicines are reviewed at length with the patient today.  The patient does not  have concerns regarding medicines.  The following changes have been made:  no change  Labs/ tests ordered today include:  No orders of the defined types were placed in this encounter.    Disposition:   FU with Dr. SwazilandJordan in 1 year  Signed, Theodore DemarkRhonda Doll Frazee, PA-C  11/16/2017 3:35 PM     [facility] Phone: [phone]; Fax: [phone]  This note was written with the assistance of speech recognition software. Please excuse any transcriptional errors.

## 2017-11-16 NOTE — Patient Instructions (Addendum)
Your physician recommends that you continue on your current medications as directed. Please refer to the Current Medication list given to you today.  Your physician recommends that you return for lab work in:  TODAY LIPID CMET  Your physician wants you to follow-up in: YEAR WITH DR SwazilandJORDAN You will receive a reminder letter in the mail two months in advance. If you don't receive a letter, please call our office to schedule the follow-up appointment.

## 2017-11-17 LAB — COMPREHENSIVE METABOLIC PANEL
ALBUMIN: 4.7 g/dL (ref 3.6–4.8)
ALK PHOS: 49 IU/L (ref 39–117)
ALT: 23 IU/L (ref 0–32)
AST: 27 IU/L (ref 0–40)
Albumin/Globulin Ratio: 1.9 (ref 1.2–2.2)
BUN / CREAT RATIO: 13 (ref 12–28)
BUN: 8 mg/dL (ref 8–27)
Bilirubin Total: 0.6 mg/dL (ref 0.0–1.2)
CHLORIDE: 95 mmol/L — AB (ref 96–106)
CO2: 28 mmol/L (ref 20–29)
Calcium: 9.5 mg/dL (ref 8.7–10.3)
Creatinine, Ser: 0.61 mg/dL (ref 0.57–1.00)
GFR calc Af Amer: 112 mL/min/{1.73_m2} (ref >59)
GFR calc non Af Amer: 97 mL/min/{1.73_m2} (ref >59)
GLUCOSE: 85 mg/dL (ref 65–99)
Globulin, Total: 2.5 g/dL (ref 1.5–4.5)
Potassium: 4.5 mmol/L (ref 3.5–5.2)
Sodium: 137 mmol/L (ref 134–144)
Total Protein: 7.2 g/dL (ref 6.0–8.5)

## 2017-11-17 LAB — LIPID PANEL
CHOL/HDL RATIO: 2.8 ratio (ref 0.0–4.4)
Cholesterol, Total: 255 mg/dL — ABNORMAL HIGH (ref 100–199)
HDL: 90 mg/dL (ref >39)
LDL CALC: 151 mg/dL — AB (ref 0–99)
TRIGLYCERIDES: 70 mg/dL (ref 0–149)
VLDL CHOLESTEROL CAL: 14 mg/dL (ref 5–40)

## 2017-11-18 IMAGING — DX DG ELBOW COMPLETE 3+V*L*
4 series · 4 of 4 positions shown · non-contrast
Comparison: None.

CLINICAL DATA: Fall on left side morning.

EXAM:
LEFT ELBOW - COMPLETE 3+ VIEW

[elbow ap]
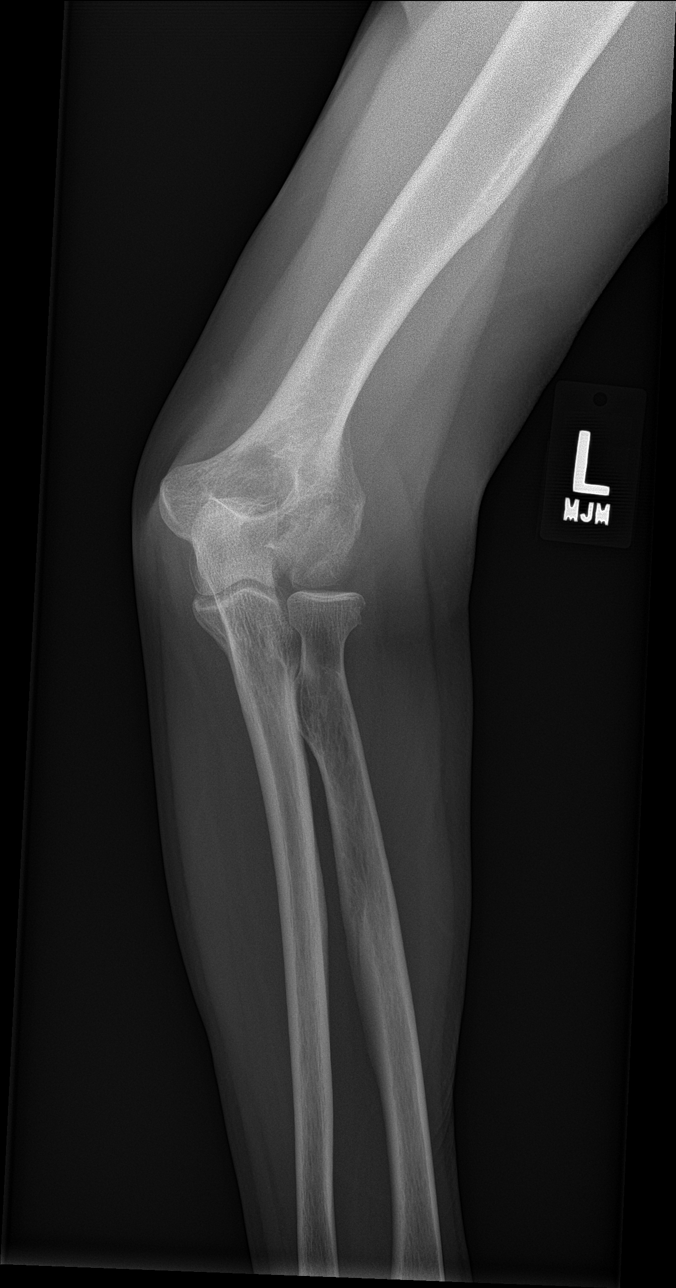

[elbow obl (1 of 2)]
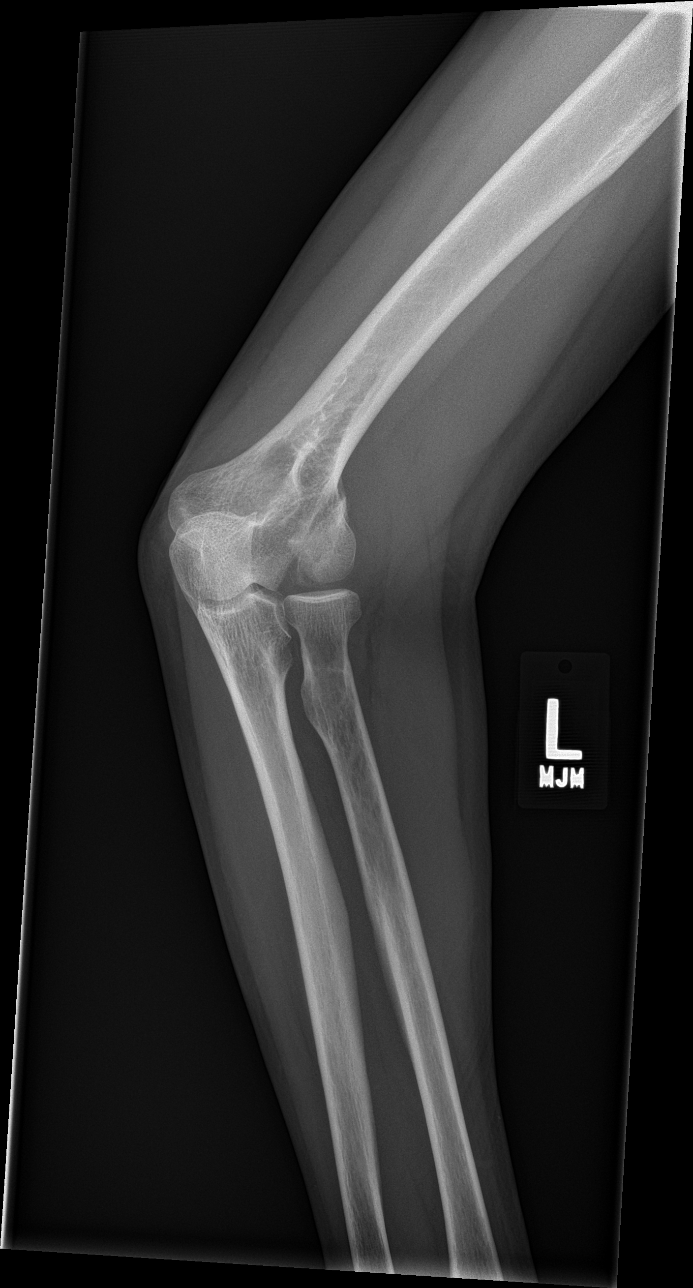

[elbow obl (2 of 2)]
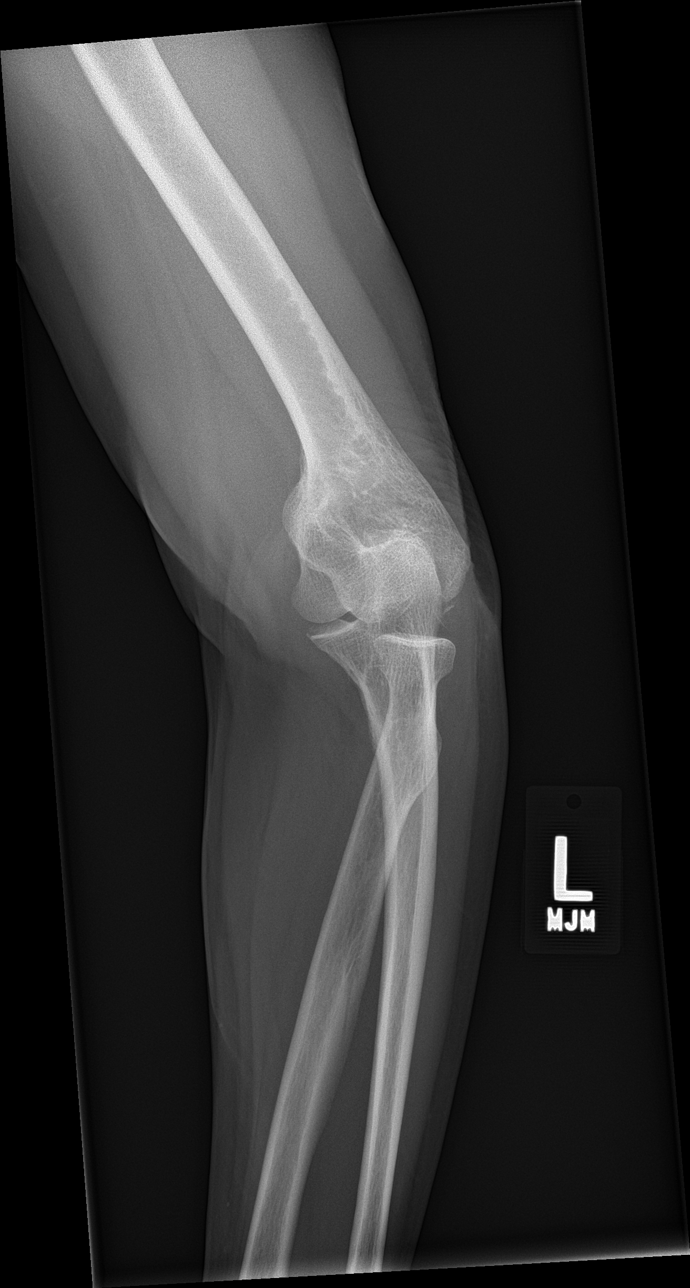

[elbow lat]
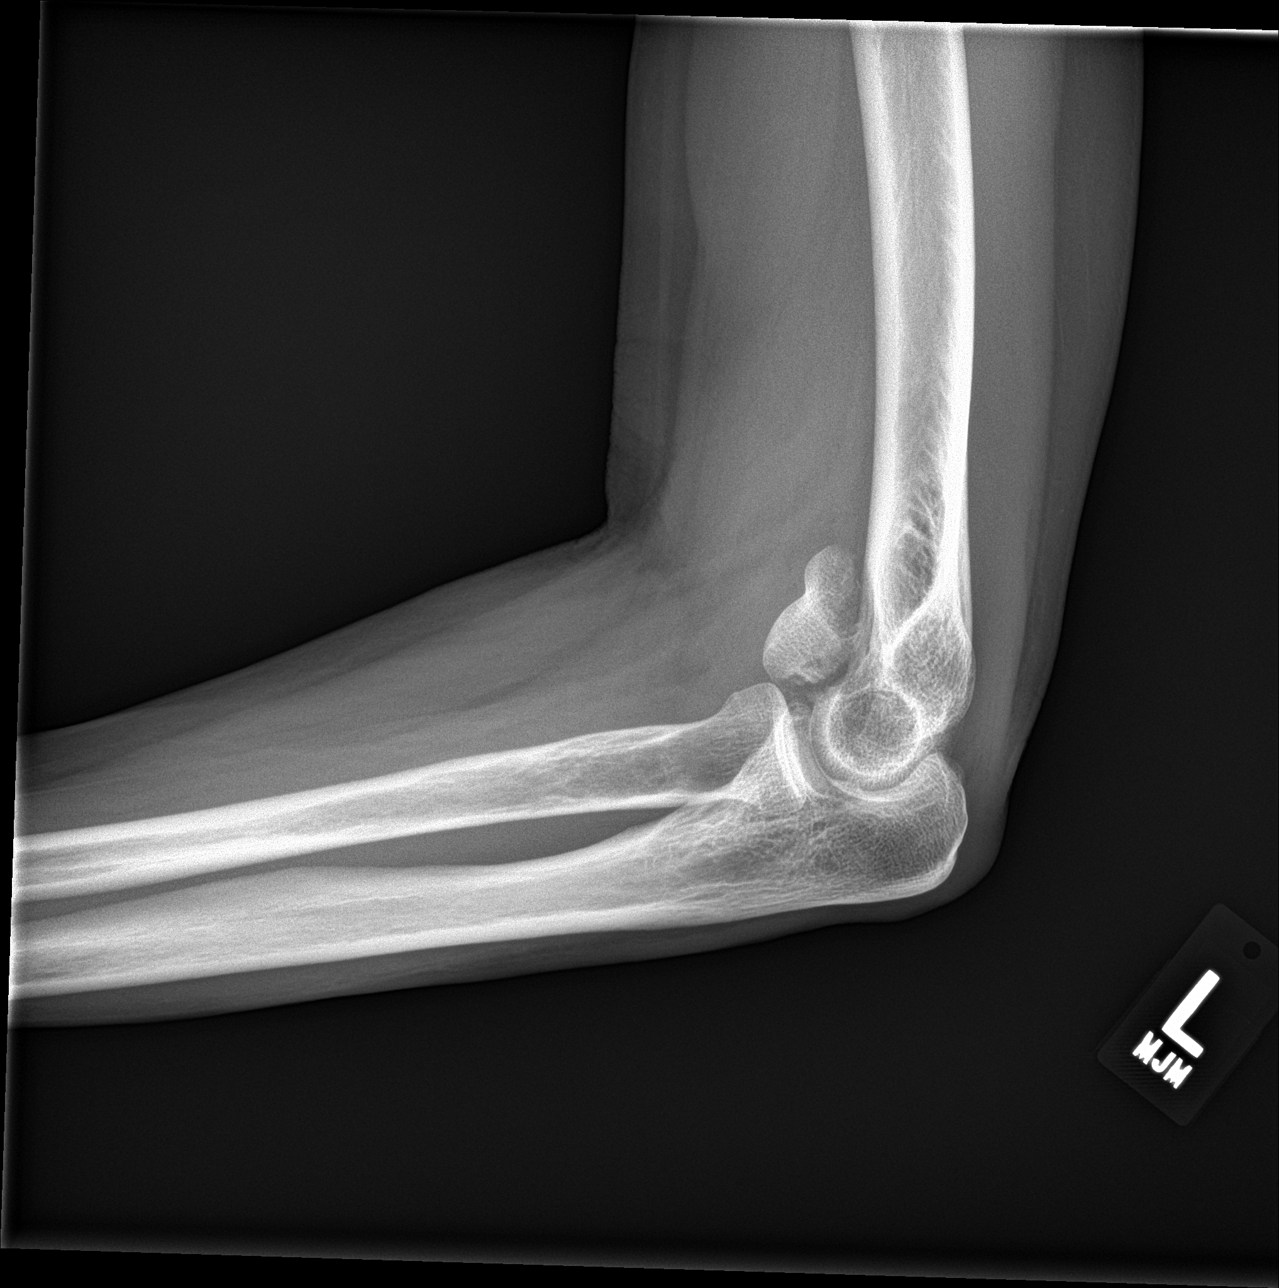

[4 of 4 positions shown; findings below may reference images not displayed]

FINDINGS: There is a displaced fracture through the lateral condyles of the
distal left humerus PC. Fracture fragment is displaced anteriorly,
best seen on the lateral view. No additional fracture noted. No
subluxation or dislocation.
IMPRESSION: Displaced fracture through the lateral condyle of the distal left
humerus.

## 2017-12-15 ENCOUNTER — Encounter: Payer: Self-pay | Admitting: Physician Assistant

## 2017-12-15 LAB — HM MAMMOGRAPHY: HM Mammogram: NORMAL (ref 0–4)

## 2017-12-27 ENCOUNTER — Telehealth: Payer: Self-pay | Admitting: Physician Assistant

## 2017-12-27 NOTE — Telephone Encounter (Signed)
Copied from CRM [id number]. Topic: Quick Communication - See Telephone Encounter >> Dec 27, 2017  4:52 PM Arlyss Gandyichardson, Lorence Nagengast N, NT wrote: CRM for notification. See Telephone encounter for: Pt was calling to get results for her bone density scan that she had done on the same day as her mammogram on 12/15/17. The results are not in MyChart nor does it look like they are in Epic. Please advise.   12/27/17.

## 2017-12-28 ENCOUNTER — Encounter: Payer: Self-pay | Admitting: Physician Assistant

## 2017-12-28 DIAGNOSIS — Z23 Encounter for immunization: Secondary | ICD-10-CM

## 2017-12-29 MED ORDER — ZOSTER VAC RECOMB ADJUVANTED 50 MCG/0.5ML IM SUSR: 0.5000 mL | Freq: Once | INTRAMUSCULAR | 1 refills | Status: AC

## 2017-12-29 MED FILL — SHINGRIX VIAL KIT: 50 | 1 days supply | Qty: 1 | Fill #0

## 2017-12-29 NOTE — Telephone Encounter (Signed)
Called [facility] - they are faxing over reports. [facility] and put copy in for scanning to pt's chart.

## 2018-01-03 ENCOUNTER — Ambulatory Visit: Payer: PRIVATE HEALTH INSURANCE | Attending: Sports Medicine | Admitting: Physical Therapy

## 2018-01-03 ENCOUNTER — Encounter: Payer: Self-pay | Admitting: Physical Therapy

## 2018-01-03 DIAGNOSIS — M6281 Muscle weakness (generalized): Secondary | ICD-10-CM | POA: Diagnosis present

## 2018-01-03 DIAGNOSIS — M25552 Pain in left hip: Secondary | ICD-10-CM | POA: Insufficient documentation

## 2018-01-03 DIAGNOSIS — R293 Abnormal posture: Secondary | ICD-10-CM | POA: Diagnosis present

## 2018-01-03 NOTE — Therapy (Signed)
[place], [place], [address] Phone: [phone]   Fax:  [phone]  Physical Therapy Evaluation  Patient Details  Name: Rita Berry MRN: [id number] Date of Birth: 05-14-1956 Referring Provider: Lilia Argue MD   Encounter Date: 01/03/2018  PT End of Session - 01/03/18 1458    Visit Number  1    Number of Visits  6    Date for PT Re-Evaluation  02/14/18    PT Start Time  1416    PT Stop Time  1458    PT Time Calculation (min)  42 min    Activity Tolerance  Patient tolerated treatment well    Behavior During Therapy  [facility] for tasks assessed/performed       Past Medical History:  Diagnosis Date  . Arthritis    hip right   . CIN I (cervical intraepithelial neoplasia I)   . Complication of anesthesia   . Essential hypertension   . Humerus distal fracture    left-08-16-16 surgery to repair  . Ovarian cyst   . PONV (postoperative nausea and vomiting)   . Pre-syncope    a. ? orthostasis/dehydration 03/2017.  Marland Kitchen Vertigo    "Positional vertigo"- head elevation best    Past Surgical History:  Procedure Laterality Date  . ABDOMINAL HYSTERECTOMY    . ABDOMINAL SURGERY     Ovarian cyst  . COLPOSCOPY    . GYNECOLOGIC CRYOSURGERY    . OOPHORECTOMY     BSO  . ORIF HUMERUS FRACTURE Left 08/16/2016   Procedure: OPEN TREATMENT OF LEFT DISTAL HUMERUS FRACTURE;  Surgeon: Milly Jakob, MD;  Location: [place];  Service: Orthopedics;  Laterality: Left;  GENERAL ANESTHESIA WITH PRE-OP BLOCK  . TOTAL HIP ARTHROPLASTY Right 10/18/2016   Procedure: RIGHT TOTAL HIP ARTHROPLASTY ANTERIOR APPROACH;  Surgeon: Paralee Cancel, MD;  Location: [facility];  Service: Orthopedics;  Laterality: Right;  . TUBAL LIGATION      There were no vitals filed for this visit.   Subjective Assessment - 01/03/18 1420    Subjective  pt 62 y.o F with CC of L hip pain that started as she increased her activity following  the R THA and started insidiously. Pain starts on the Lateral aspect of the hip, and radiates to the mid lateral thigh. pt rpeorts she feels it could be coming from the back of the hip. pt denies N/T and reports its mostly soreness. she states she feels like it is getting better but it fluctuates depending on activity.     How long can you sit comfortably?  unlimited    How long can you stand comfortably?  unlimited    How long can you walk comfortably?  unlimited    Diagnostic tests  N/A    Patient Stated Goals  to calm down pain down     Currently in Pain?  Yes    Pain Score  1  at worst pain gets to 4-5/10    Pain Location  Hip    Pain Orientation  Left    Pain Descriptors / Indicators  Burning;Sore;Aching    Pain Type  Chronic pain    Pain Onset  More than a month ago    Pain Frequency  Constant    Aggravating Factors   stairs, prolonged walking    Pain Relieving Factors  medication,          [facility] PT Assessment - 01/03/18 1421  Assessment   Medical Diagnosis  L hip pain    Referring Provider  Lilia Argue MD    Onset Date/Surgical Date  -- October 2017    Hand Dominance  Right    Next MD Visit  make on PRN    Prior Therapy  yes      Precautions   Precautions  None      Restrictions   Weight Bearing Restrictions  No      Balance Screen   Has the patient fallen in the past 6 months  No    Has the patient had a decrease in activity level because of a fear of falling?   No    Is the patient reluctant to leave their home because of a fear of falling?   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       Cognition   Overall Cognitive Status  Within Functional Limits for tasks assessed      Observation/Other  Assessments   Focus on Therapeutic Outcomes (FOTO)   37% limited predicted 29% limited      Posture/Postural Control   Posture/Postural Control  Postural limitations    Postural Limitations  Forward head      ROM / Strength   AROM / PROM / Strength  AROM;PROM;Strength      AROM   AROM Assessment Site  Hip    Right/Left Hip  Right;Left      PROM   PROM Assessment Site  Hip    Right/Left Hip  Left      Strength   Strength Assessment Site  Hip;Knee    Right/Left Hip  Right;Left    Right Hip Flexion  4/5    Right Hip Extension  3+/5    Right Hip ABduction  4-/5    Left Hip Flexion  4+/5    Left Hip Extension  3+/5    Left Hip ABduction  4-/5    Right/Left Knee  Right;Left    Right Knee Flexion  4+/5    Right Knee Extension  4+/5    Left Knee Flexion  4+/5    Left Knee Extension  4+/5      Palpation   SI assessment   deeper L sacral ala compared bil, and tenderness at the PSIS on L    Palpation comment  L PSIS, greater trochanter and glute medius      Special Tests    Special Tests  Hip Special Tests;Sacrolliac Tests    Other special tests  forward flexion testing (+) on the L side with decreased upward movement, gillets test negative    Sacroiliac Tests   Gaenslen's Test    Hip Special Tests   Hip Scouring;Ober's Test      Sacral thrust    Findings  Negative      Gaenslen's test   Findings  Positive    Side   Left      Ober's Test   Findings  Not tested      Hip Scouring   Findings  Negative    Side  Left             Objective measurements  completed on examination: See above findings.      [facility] Adult PT Treatment/Exercise - 01/03/18 1421      Lumbar Exercises: Supine   Other Supine Lumbar Exercises  posterior pelvic tilt 2 x 10 holding 5 sec ea.    Other Supine Lumbar Exercises  SLR 1 x 15      Knee/Hip Exercises: Stretches   Active Hamstring Stretch  30 seconds;3 reps PNF contract/ relax contracting for 10 sec      Manual Therapy   Manual  Therapy  Muscle Energy Technique;Joint mobilization    Joint Mobilization  R inferior angle of sacrum grade 3-4 PA mobs    Muscle Energy Technique  resisted L hip flexion 5 x 10 sec hold               PT Short Term Goals - 01/03/18 1559      PT SHORT TERM GOAL #1   Title  pt to be I with inital HEP     Time  3    Period  Weeks    Status  New    Target Date  01/24/18        PT Long Term Goals - 01/03/18 1601      PT LONG TERM GOAL #1   Title  increase bil hip abductor/ extensor strength to >/=4+/5 to promote hip stability during CKC activities    Time  6    Period  Weeks    Status  New    Target Date  02/14/18      PT LONG TERM GOAL #2   Title  to reduce hip tightness to reduce pain to </= 1/10 and promote function    Time  6    Period  Weeks    Status  New    Target Date  02/14/18      PT LONG TERM GOAL #3   Title  increase FOTO score to </= 20% limited to demo improvement  in function     Baseline  -    Time  6    Period  Weeks    Status  New      PT LONG TERM GOAL #4   Title  pt to be I with all HEP given as of last visit     Baseline  -    Time  6    Period  Weeks    Status  New    Target Date  02/14/18             Plan - 01/03/18 1458    Clinical Impression Statement  pt presents to [facility] with CC of L hip pain that started back in 2017 with insidious onset  following her R THA. hip mobility is WFL, with limited strength in bil hip abductors/ extensors. TTP along L PSIS, greater trochanter and glute medius. Special testing suggest positive likelihood of posterior innominate rotation on L. She would benefit from physical therapy to reduce L hip pain, improve strength and maximize her function by addressing the deficits listed.     Clinical Presentation  Stable    Clinical Decision Making  Low    PT Frequency  1x / week    PT Duration  6 weeks    PT Treatment/Interventions  ADLs/Self Care Home Management;Cryotherapy;Electrical  Stimulation;Iontophoresis '4mg'$ /ml Dexamethasone;Moist Heat;Ultrasound;Therapeutic exercise;Therapeutic activities;Manual techniques;Dry needling;Taping;Patient/family education;Neuromuscular re-education    PT Next Visit Plan  review and update HEP, possible L posterior innominate rotation, SLR, hamstring stretching, DN  for glute med?,     PT Home Exercise Plan  self MET for L hip flexor, hamstring stretching (supine/ sitting), SLR, posterior pelvic titl     Consulted and Agree with Plan of Care  Patient       Patient will benefit from skilled therapeutic intervention in order to improve the following deficits and impairments:  Pain, Decreased strength, Postural dysfunction, Improper body mechanics  Visit Diagnosis: Pain in left hip  Muscle weakness (generalized)  Abnormal posture     Problem List Patient Active Problem List   Diagnosis Date Noted  . 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 Osteoarthritis of right hip 06/14/2016  . Essential hypertension 06/12/2015  . Diverticulosis 06/12/2015  . Pain in joint, ankle and foot 09/16/2014  . Bilateral bunions 09/16/2014  . Iliotibial band syndrome of left side 10/30/2013  . Greater trochanteric bursitis of both hips 10/30/2013  . Abnormality of gait 06/18/2013  . Eucalcemic Hyperparathyroidism 04/19/2013  . Osteopenia 04/19/2013  . CIN I (cervical intraepithelial neoplasia I)   . Ovarian cyst    Starr Lake PT, DPT, LAT, ATC  01/03/18  4:05 PM      [facility] [address] [place], [place], [address] Phone: [phone]   Fax:  [phone]  Name: SARINA ROBLETO MRN: [id number] Date of Birth: 03-Feb-1956

## 2018-01-05 ENCOUNTER — Encounter: Payer: Self-pay | Admitting: Physician Assistant

## 2018-01-05 NOTE — Telephone Encounter (Signed)
Pt concerned because of fluctuations of her BP.   She is taking Losartan 50 mg q am Chlorthalidone 1/2 tab at 10 am Losartan 25 mg in the afternoon.  However, her BP is up to 140/90 in the afternoon sometimes.  She wonders if she should take the 25 mg in the morning and 50 mg at lunch. She feels this might help her not have the afternoon peak.   Advised ok to try this if she wishes, however, she is also advised that these BPs are not critically high, don't worry too much about this.   Theodore DemarkRhonda Pranish Akhavan, PA-C 01/05/2018 6:40 PM Beeper [phone]

## 2018-01-11 ENCOUNTER — Other Ambulatory Visit: Payer: Self-pay

## 2018-01-11 MED ORDER — CHLORTHALIDONE 25 MG PO TABS: 12.5000 mg | ORAL_TABLET | Freq: Every day | ORAL | 3 refills | Status: DC

## 2018-01-17 ENCOUNTER — Ambulatory Visit: Payer: PRIVATE HEALTH INSURANCE | Admitting: Physical Therapy

## 2018-01-17 ENCOUNTER — Encounter: Payer: Self-pay | Admitting: Physical Therapy

## 2018-01-17 DIAGNOSIS — M6281 Muscle weakness (generalized): Secondary | ICD-10-CM

## 2018-01-17 DIAGNOSIS — R293 Abnormal posture: Secondary | ICD-10-CM

## 2018-01-17 DIAGNOSIS — M25552 Pain in left hip: Secondary | ICD-10-CM

## 2018-01-17 NOTE — Therapy (Addendum)
[place], [place], [address] Phone: [phone]   Fax:  [phone]  Physical Therapy Treatment  Patient Details  Name: Rita Berry MRN: [id number] Date of Birth: 20-Nov-1956 Referring Provider: Lilia Argue MD   Encounter Date: 01/17/2018  PT End of Session - 01/17/18 1331    Visit Number  2    Number of Visits  6    Date for PT Re-Evaluation  02/14/18    PT Start Time  1331    PT Stop Time  1413    PT Time Calculation (min)  42 min    Activity Tolerance  Patient tolerated treatment well    Behavior During Therapy  [facility] for tasks assessed/performed       Past Medical History:  Diagnosis Date  . Arthritis    hip right   . CIN I (cervical intraepithelial neoplasia I)   . Complication of anesthesia   . Essential hypertension   . Humerus distal fracture    left-08-16-16 surgery to repair  . Ovarian cyst   . PONV (postoperative nausea and vomiting)   . Pre-syncope    a. ? orthostasis/dehydration 03/2017.  Marland Kitchen Vertigo    "Positional vertigo"- head elevation best    Past Surgical History:  Procedure Laterality Date  . ABDOMINAL HYSTERECTOMY    . ABDOMINAL SURGERY     Ovarian cyst  . COLPOSCOPY    . GYNECOLOGIC CRYOSURGERY    . OOPHORECTOMY     BSO  . ORIF HUMERUS FRACTURE Left 08/16/2016   Procedure: OPEN TREATMENT OF LEFT DISTAL HUMERUS FRACTURE;  Surgeon: Milly Jakob, MD;  Location: [facility];  Service: Orthopedics;  Laterality: Left;  GENERAL ANESTHESIA WITH PRE-OP BLOCK  . TOTAL HIP ARTHROPLASTY Right 10/18/2016   Procedure: RIGHT TOTAL HIP ARTHROPLASTY ANTERIOR APPROACH;  Surgeon: Paralee Cancel, MD;  Location[facility];  Service: Orthopedics;  Laterality: Right;  . TUBAL LIGATION      There were no vitals filed for this visit.  Subjective Assessment - 01/17/18 1330    Subjective  "the exercise I have bone every day, I would say it is 50-60% improvement"    Currently in  Pain?  Yes    Pain Score  1     Pain Orientation  Left    Pain Descriptors / Indicators  Burning;Sore;Aching    Pain Type  Chronic pain    Pain Frequency  Intermittent    Aggravating Factors   prolonged walking     Pain Relieving Factors  medication, stretch,                       [facility] Adult PT Treatment/Exercise - 01/17/18 1341      Lumbar Exercises: Supine   Dead Bug  -- 4 reps holding 15 sec     Bridge with clamshell  -- 2 x 10 with red theraband, second set with arms crossed      Knee/Hip Exercises: Stretches   Other Knee/Hip Stretches  Glute med 2 x 30 along       Knee/Hip Exercises: Aerobic   Elliptical  L1 x 5 min with elevation fo 10 min  forward 2 min and backward      Manual Therapy   Manual Therapy  Joint mobilization    Joint Mobilization  Long axis distraction of L hip grade 5    Muscle Energy Technique  resisted L hip flexion 5 x 10 sec hold in  R sidelying with grade 3-4 anterior hip mobilization             PT Education - 01/17/18 1414    Education provided  Yes    Education Details  reviewed previously provided HEP, and updated HEP today.     Person(s) Educated  Patient    Methods  Explanation;Verbal cues    Comprehension  Verbalized understanding;Verbal cues required       PT Short Term Goals - 01/17/18 1415      PT SHORT TERM GOAL #1   Title  pt to be I with inital HEP     Time  3    Period  Weeks    Status  Achieved        PT Long Term Goals - 01/03/18 1601      PT LONG TERM GOAL #1   Title  increase bil hip abductor/ extensor strength to >/=4+/5 to promote hip stability during CKC activities    Time  6    Period  Weeks    Status  New    Target Date  02/14/18      PT LONG TERM GOAL #2   Title  to reduce hip tightness to reduce pain to </= 1/10 and promote function    Time  6    Period  Weeks    Status  New    Target Date  02/14/18      PT LONG TERM GOAL #3   Title  increase FOTO score to </= 20% limited to demo  improvement  in function     Baseline  -    Time  6    Period  Weeks    Status  New      PT LONG TERM GOAL #4   Title  pt to be I with all HEP given as of last visit     Baseline  -    Time  6    Period  Weeks    Status  New    Target Date  02/14/18            Plan - 01/17/18 1354    Clinical Impression Statement  pt reports significant relief of pain since the last session and reports consistency with HEP. continued with manual techniqeus to address innmoinate posterior rotation. Continued hip strengthening to provide stability which she performed well. upgraded HEP for core strengthening and IT band stretching. If she continues to progress well plan to discharge in the next 1-2 visits.     PT Next Visit Plan  review and update HEP, possible L posterior innominate rotation, SLR, hamstring stretching,    PT Home Exercise Plan  self MET for L hip flexor, hamstring stretching (supine/ sitting), SLR, posterior pelvic titl     Consulted and Agree with Plan of Care  Patient       Patient will benefit from skilled therapeutic intervention in order to improve the following deficits and impairments:     Visit Diagnosis: Pain in left hip  Muscle weakness (generalized)  Abnormal posture     Problem List Patient Active Problem List   Diagnosis Date Noted  . 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 Osteoarthritis of right hip 06/14/2016  . Essential hypertension 06/12/2015  . Diverticulosis 06/12/2015  . Pain in joint, ankle and foot 09/16/2014  . Bilateral bunions 09/16/2014  . Iliotibial band syndrome of left side 10/30/2013  . Greater trochanteric bursitis of both hips 10/30/2013  .  Abnormality of gait 06/18/2013  . Eucalcemic Hyperparathyroidism 04/19/2013  . Osteopenia 04/19/2013  . CIN I (cervical intraepithelial neoplasia I)   . Ovarian cyst    Starr Lake PT, DPT, LAT, ATC  01/17/18  2:17 PM      [facility] [address] [place], [place], [address] Phone: [phone]   Fax:  [phone]  Name: Rita Berry MRN: [id number] Date of Birth: 1956/07/29     PHYSICAL THERAPY DISCHARGE SUMMARY  Visits from Start of Care: 2  Current functional level related to goals / functional outcomes: See goals   Remaining deficits: Pt reports she is doing well as of this visit and reports she no longer needs PT.    Education / Equipment: HEP, posture, medication  Plan: Patient agrees to discharge.  Patient goals were partially met. Patient is being discharged due to being pleased with the current functional level.  ?????         Jourdain Guay PT, DPT, LAT, ATC  02/20/18  1:12 PM

## 2018-01-21 NOTE — Telephone Encounter (Signed)
[facility].  OSTEOPENIA (low bone mass, NOT osteoporosis). Advise that patient continue calcium, vitamin D and weight bearing exercise. Repeat in 2 years.

## 2018-01-23 NOTE — Telephone Encounter (Signed)
Left pt a VM with results of Dexa scan

## 2018-02-14 ENCOUNTER — Ambulatory Visit: Payer: PRIVATE HEALTH INSURANCE | Admitting: Physical Therapy

## 2018-03-01 MED FILL — SHINGRIX VIAL KIT: 50 | 1 days supply | Qty: 1 | Fill #1

## 2018-03-04 IMAGING — DX DG SINUSES 1-2V
1 series · 1 of 1 positions shown · non-contrast
Comparison: CT 11/21/2016

CLINICAL DATA: Sore throat with tightness and sinus tenderness.

EXAM:
PARANASAL SINUSES - 1-2 VIEW

[skull waters]
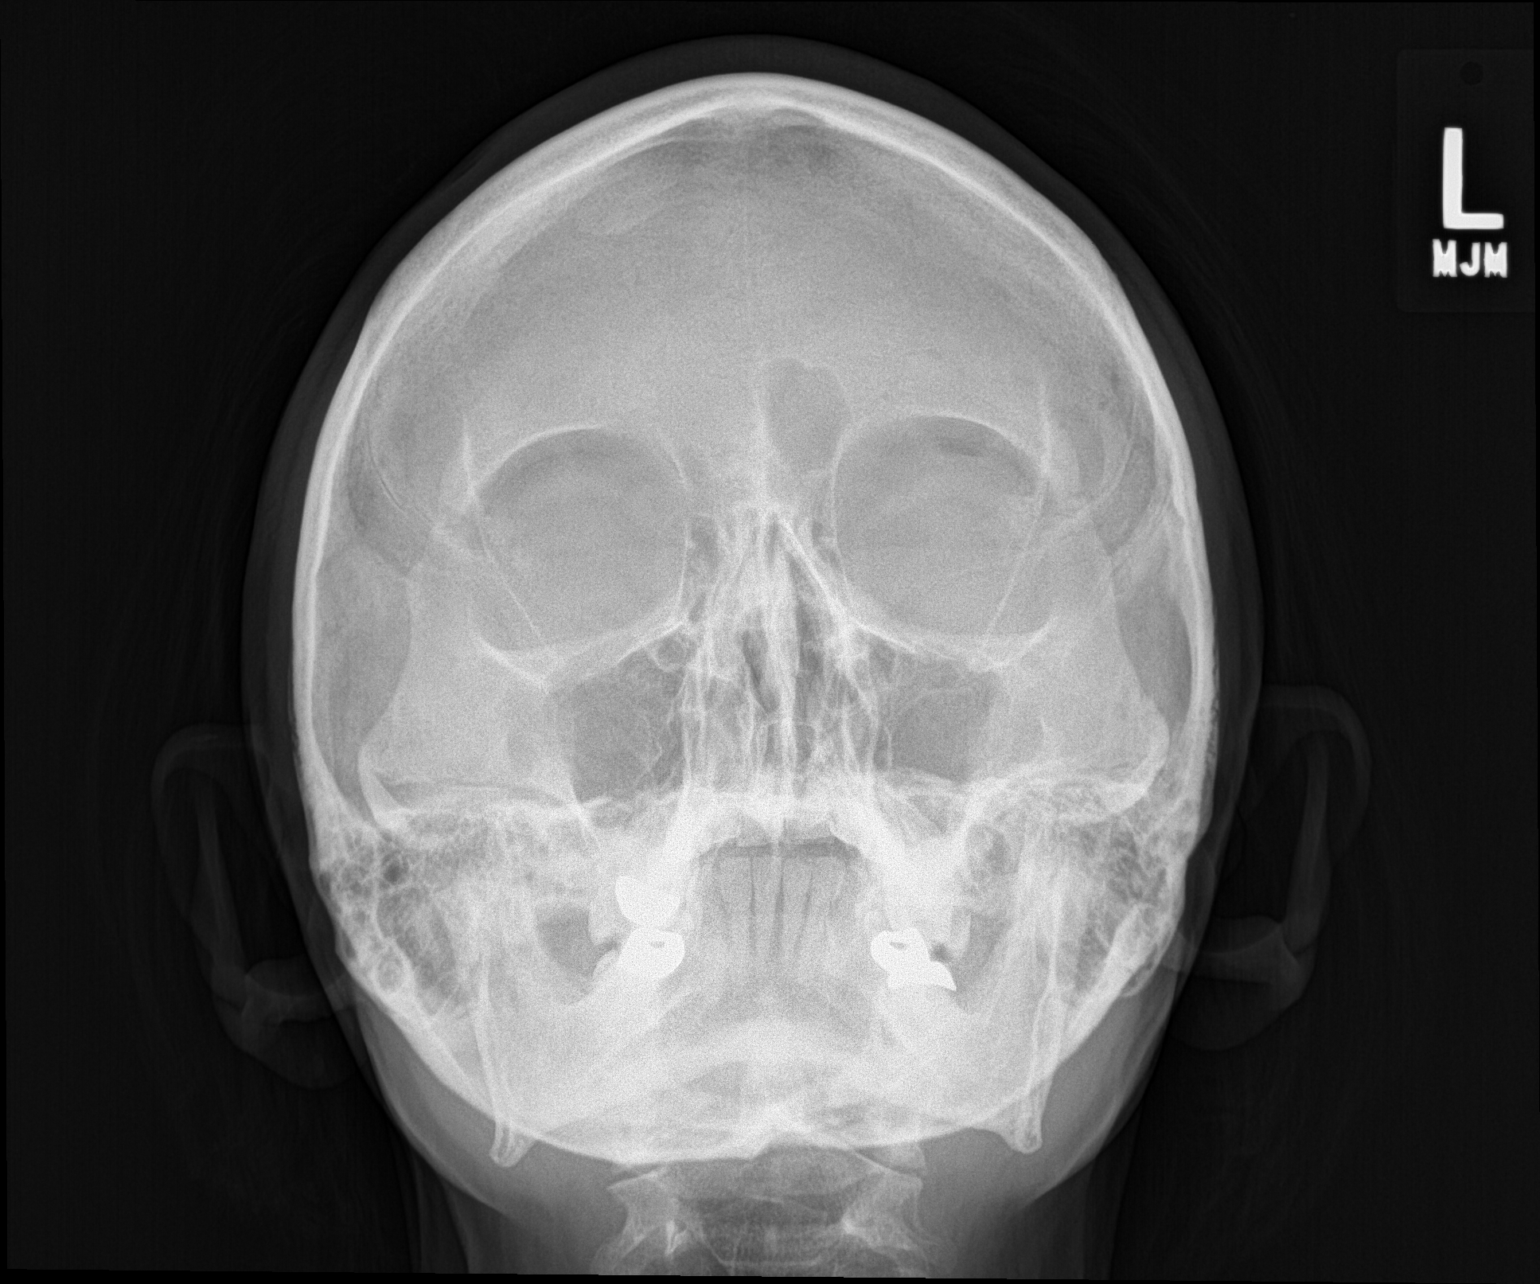

[1 of 1 positions shown; findings below may reference images not displayed]

FINDINGS: The paranasal sinus are aerated. There is no evidence of sinus
opacification air-fluid levels or mucosal thickening. No significant
bone abnormalities are seen.
IMPRESSION: Negative.

## 2018-03-26 ENCOUNTER — Encounter: Payer: Self-pay | Admitting: Physician Assistant

## 2018-03-27 ENCOUNTER — Telehealth: Payer: Self-pay | Admitting: Sports Medicine

## 2018-03-27 ENCOUNTER — Other Ambulatory Visit: Payer: Self-pay | Admitting: *Deleted

## 2018-03-27 MED ORDER — AMBULATORY NON FORMULARY MEDICATION: 3 refills | Status: DC

## 2018-03-27 NOTE — Telephone Encounter (Signed)
[facility] for the topical compound prescription she uses for pain.  She completed PT now and is now doing home exercises.  (If necessary she will make an appt, just let her know)

## 2018-04-10 ENCOUNTER — Other Ambulatory Visit: Payer: Self-pay | Admitting: Nurse Practitioner

## 2018-05-15 ENCOUNTER — Encounter: Payer: Self-pay | Admitting: Family Medicine

## 2018-05-15 ENCOUNTER — Other Ambulatory Visit: Payer: Self-pay | Admitting: Nurse Practitioner

## 2018-05-15 NOTE — Telephone Encounter (Signed)
Rx sent to pharmacy   

## 2018-06-18 ENCOUNTER — Encounter: Payer: Self-pay | Admitting: Physician Assistant

## 2018-06-18 DIAGNOSIS — E7800 Pure hypercholesterolemia, unspecified: Secondary | ICD-10-CM

## 2018-06-18 DIAGNOSIS — Z79899 Other long term (current) drug therapy: Secondary | ICD-10-CM

## 2018-08-31 ENCOUNTER — Other Ambulatory Visit: Payer: Self-pay | Admitting: Physician Assistant

## 2018-08-31 MED ORDER — LOSARTAN POTASSIUM 25 MG PO TABS: 25.0000 mg | ORAL_TABLET | Freq: Two times a day (BID) | ORAL | 6 refills | Status: DC

## 2018-10-10 ENCOUNTER — Other Ambulatory Visit: Payer: Self-pay | Admitting: *Deleted

## 2018-10-10 MED ORDER — CHLORTHALIDONE 25 MG PO TABS: 12.5000 mg | ORAL_TABLET | Freq: Every day | ORAL | 1 refills | Status: DC

## 2019-01-21 ENCOUNTER — Other Ambulatory Visit: Payer: Self-pay

## 2019-01-21 MED ORDER — AMBULATORY NON FORMULARY MEDICATION: 1 refills | Status: AC

## 2019-01-23 ENCOUNTER — Other Ambulatory Visit: Payer: Self-pay | Admitting: *Deleted

## 2019-01-23 MED ORDER — CHLORTHALIDONE 25 MG PO TABS: 12.5000 mg | ORAL_TABLET | Freq: Every day | ORAL | 0 refills | Status: DC

## 2019-02-11 ENCOUNTER — Ambulatory Visit (INDEPENDENT_AMBULATORY_CARE_PROVIDER_SITE_OTHER): Payer: PRIVATE HEALTH INSURANCE | Admitting: Sports Medicine

## 2019-02-11 VITALS — BP 126/82 | Ht 67.0 in | Wt 130.0 lb

## 2019-02-11 DIAGNOSIS — M25552 Pain in left hip: Secondary | ICD-10-CM | POA: Diagnosis not present

## 2019-02-11 NOTE — Patient Instructions (Signed)
Thank you for coming in to see [place] today. Please see below to review our plan for today's visit.  Continue the PT exercises we discussed and follow up as needed.  Durward Parcel, DO [facility], PGY-3

## 2019-02-11 NOTE — Assessment & Plan Note (Signed)
Acute.  Findings also consistent with gluteus medius weakness.  Does have history of total right hip.  No signs of acetabular involvement. - Discussed home PT exercises - Continue compound pain topical formula as needed - RTC as needed

## 2019-02-11 NOTE — Progress Notes (Signed)
  Rita Berry - 63 y.o. female MRN [id number]  Date of birth: December 21, 1955  SUBJECTIVE:  Including CC & ROS.  Rita Berry is a 63 year old female presenting with acute right hip pain.  She has a history of total right hip replacement back in 2017.  She is very active and usually runs and bikes.  She recently began having lateral hip pain approximately 4-5 weeks ago with gradual onset.  She denies any radiation to the groin.  Pain seems to be worse when taking stairs.  She has been icing and using a topical compound pain cream with some improvement along with Tylenol and Aleve.  Her friend who works as a Adult nurse diagnosed her with greater trochanter pain syndrome and thought this could be also piriformis.   HISTORY: Past Medical, Surgical, Social, and Family History Reviewed & Updated per EMR.   Pertinent Historical Findings include: None.  PHYSICAL EXAM:  VS: BP:126/82  HR: bpm  TEMP: ( )  RESP:   HT:5\' 7"  (170.2 cm)   WT:130 lb (59 kg)  BMI:20.36 PHYSICAL EXAM: General: well nourished, well developed, NAD with non-toxic appearance HEENT: normocephalic, atraumatic, moist mucous membranes Lungs: normal work of breathing Skin: warm, dry, no rashes or lesions Extremities: warm and well perfused, normal tone, lower extremity exam revealing tenderness to right trochanter on palpation along with piriformis tenderness, there is a positive Trendelenburg with lifting of the left leg, 5/5 motor strength on lower extremities bilaterally, range of motion remains intact, neurovascular intact  ASSESSMENT & PLAN: See problem based charting & AVS for pt instructions.  Greater trochanteric pain syndrome of left lower extremity Acute.  Findings also consistent with gluteus medius weakness.  Does have history of total right hip.  No signs of acetabular involvement. - Discussed home PT exercises - Continue compound pain topical formula as needed - RTC as needed

## 2019-02-12 ENCOUNTER — Ambulatory Visit: Payer: PRIVATE HEALTH INSURANCE | Admitting: Sports Medicine

## 2019-03-14 ENCOUNTER — Encounter: Payer: Self-pay | Admitting: Family Medicine

## 2019-03-14 ENCOUNTER — Ambulatory Visit: Payer: Self-pay | Admitting: Family Medicine

## 2019-03-14 DIAGNOSIS — J309 Allergic rhinitis, unspecified: Secondary | ICD-10-CM

## 2019-03-14 MED ORDER — CETIRIZINE HCL 10 MG PO TABS: 10.0000 mg | ORAL_TABLET | Freq: Every day | ORAL | 0 refills | Status: DC

## 2019-03-14 MED ORDER — FLUTICASONE PROPIONATE 50 MCG/ACT NA SUSP: 2.0000 | Freq: Every day | NASAL | 0 refills | Status: AC

## 2019-03-14 NOTE — Patient Instructions (Signed)

## 2019-03-14 NOTE — Progress Notes (Signed)
Rita Berry is a 63 y.o. female who presents today with 14 days of eye itching, nasal congestion, and runny nose. Patient is between PCP's and does report a history of seasonal allergies. She is not currently medicated but reports using some over the counter antihistamines and nasal sprays with success. She currently reports that she has been rinsing out her eyes each morning to help with the itchiness and this has resolved some of her symptoms. She reports daily early morning walks of 3 miles with her dogs and this activity may contribute to her symptoms.  Review of Systems  Constitutional: Negative for chills, fever and malaise/fatigue.  HENT: Negative for congestion, ear discharge, ear pain, sinus pain and sore throat.        Sneezing, runny nose, nasal congestion  Eyes: Negative.  Negative for blurred vision, double vision, photophobia, pain, discharge and redness.       Itchy watery eyes  Respiratory: Negative for cough, sputum production and shortness of breath.   Cardiovascular: Negative.  Negative for chest pain.  Gastrointestinal: Negative for abdominal pain, diarrhea, nausea and vomiting.  Genitourinary: Negative for dysuria, frequency, hematuria and urgency.  Musculoskeletal: Negative for myalgias.  Skin: Negative.   Neurological: Negative for headaches.  Endo/Heme/Allergies: Negative.   Psychiatric/Behavioral: Negative.     Rita Berry has a current medication list which includes the following prescription(s): AMBULATORY NON FORMULARY MEDICATION, b complex vitamins, calcium-magnesium, cetirizine, chlorthalidone, vitamin d, fluticasone, losartan, and multivitamin. Also is allergic to codeine and latex.  Rita Berry  has a past medical history of Arthritis, CIN I (cervical intraepithelial neoplasia I), Complication of anesthesia, Essential hypertension, Humerus distal fracture, Ovarian cyst, PONV (postoperative nausea and vomiting), Pre-syncope, and Vertigo. Also  has a past surgical history  that includes Gynecologic cryosurgery; Colposcopy; Abdominal hysterectomy; Abdominal surgery; Oophorectomy; Tubal ligation; ORIF humerus fracture (Left, 08/16/2016); and Total hip arthroplasty (Right, 10/18/2016).    O: Vitals:   03/14/19 1151  BP: 132/86  Pulse: 86  Temp: 98.2 F (36.8 C)    Physical Exam Vitals signs reviewed.  Constitutional:      General: She is not in acute distress.    Appearance: Normal appearance. She is well-developed and normal weight. She is not ill-appearing, toxic-appearing or diaphoretic.  HENT:     Head: Normocephalic.     Right Ear: Hearing, tympanic membrane, ear canal and external ear normal. No middle ear effusion. There is no impacted cerumen. No PE tube. Tympanic membrane is not injected, erythematous, retracted or bulging.     Left Ear: Hearing, ear canal and external ear normal.  No middle ear effusion. There is no impacted cerumen. No PE tube. Tympanic membrane is not injected, erythematous, retracted or bulging.     Nose: Rhinorrhea present. No congestion.     Right Sinus: No maxillary sinus tenderness or frontal sinus tenderness.     Left Sinus: No maxillary sinus tenderness or frontal sinus tenderness.     Mouth/Throat:     Lips: Pink.     Mouth: Mucous membranes are moist.     Pharynx: Uvula midline. Oropharyngeal exudate present.     Tonsils: No tonsillar exudate or tonsillar abscesses. 1+ on the right. 1+ on the left.     Comments: Evidence of grayish mucus down back of posterior pharynx consistent with post nasal drip Eyes:     General: Lids are normal. No allergic shiner.    Extraocular Movements: Extraocular movements intact.     Conjunctiva/sclera:     Right eye:  Right conjunctiva is injected.     Left eye: Left conjunctiva is injected.     Pupils: Pupils are equal, round, and reactive to light.     Comments: Mild eye erythema/irritation  Neck:     Musculoskeletal: Normal range of motion and neck supple.     Thyroid: No thyroid  mass, thyromegaly or thyroid tenderness.     Trachea: Trachea and phonation normal.  Cardiovascular:     Rate and Rhythm: Normal rate and regular rhythm.     Pulses: Normal pulses.     Heart sounds: Normal heart sounds.  Pulmonary:     Effort: Pulmonary effort is normal.     Breath sounds: Normal breath sounds.  Abdominal:     General: Abdomen is flat. Bowel sounds are normal.     Palpations: Abdomen is soft.  Musculoskeletal: Normal range of motion.  Lymphadenopathy:     Head:     Right side of head: No submental, submandibular or tonsillar adenopathy.     Left side of head: No submental, submandibular or tonsillar adenopathy.     Cervical: No cervical adenopathy.     Right cervical: No superficial cervical adenopathy.    Left cervical: No superficial cervical adenopathy.  Skin:    General: Skin is warm.  Neurological:     Mental Status: She is alert and oriented to person, place, and time.  Psychiatric:        Mood and Affect: Mood normal.        Behavior: Behavior is cooperative.    A: 1. Allergic rhinitis, unspecified seasonality, unspecified trigger    P: Discussed the benefit of having an allergy regimen versus treating as needed, patient may benefit from an eye drop as well.  1. Allergic rhinitis, unspecified seasonality, unspecified trigger - cetirizine (ZYRTEC) 10 MG tablet; Take 1 tablet (10 mg total) by mouth daily. - fluticasone (FLONASE) 50 MCG/ACT nasal spray; Place 2 sprays into both nostrils daily.   Discussed with patient exam findings, suspected diagnosis etiology and  reviewed recommended treatment plan and follow up, including complications and indications for urgent medical follow up and evaluation. Medications including use and indications reviewed with patient. Patient provided relevant patient education on diagnosis and/or relevant related condition that were discussed and reviewed with patient at discharge. Patient verbalized understanding of  information provided and agrees with plan of care (POC), all questions answered.

## 2019-03-18 ENCOUNTER — Other Ambulatory Visit: Payer: Self-pay

## 2019-03-18 MED ORDER — CHLORTHALIDONE 25 MG PO TABS: 12.5000 mg | ORAL_TABLET | Freq: Every day | ORAL | 2 refills | Status: DC

## 2019-04-01 ENCOUNTER — Other Ambulatory Visit: Payer: Self-pay

## 2019-04-01 ENCOUNTER — Ambulatory Visit (INDEPENDENT_AMBULATORY_CARE_PROVIDER_SITE_OTHER): Payer: PRIVATE HEALTH INSURANCE | Admitting: Sports Medicine

## 2019-04-01 ENCOUNTER — Encounter: Payer: Self-pay | Admitting: Sports Medicine

## 2019-04-01 VITALS — BP 130/68 | HR 81 | Temp 97.3°F | Ht 67.0 in | Wt 128.0 lb

## 2019-04-01 DIAGNOSIS — M25552 Pain in left hip: Secondary | ICD-10-CM

## 2019-04-01 MED ORDER — CHLORTHALIDONE 25 MG PO TABS: 12.5000 mg | ORAL_TABLET | Freq: Every day | ORAL | 2 refills | Status: DC

## 2019-04-01 NOTE — Progress Notes (Signed)
  Virtual Visit via Video Note  I connected with Rita Berry on 04/01/19 at  1:30 PM EDT by a video enabled telemedicine application and verified that I am speaking with the correct person using two identifiers.   I discussed the limitations of evaluation and management by telemedicine and the availability of in person appointments. The patient expressed understanding and agreed to proceed.  History of Present Illness: Rita Berry is following up today on right hip pain secondary to greater trochanteric pain syndrome.  She has been very diligent about doing her home exercises.  Her pain has decreased from an 8 out of 10 to a 2 out of 10.  She still has pain with certain activities such as walking hills but overall her symptoms seem to be slowly resolving.  She continues to localize her pain to the lateral hip.  She continues to deny groin pain.  She has a history of a total hip arthroplasty in 2017 but her current pain is much different in nature than what she experienced at that time.    Observations/Objective: Sitting comfortably.  No acute distress.  Hip exam is unobtainable secondary to the nature of this visit   Assessment and Plan: Greater trochanteric pain syndrome, right hip  Patient is encouraged to continue with her home exercises.  She may continue to increase activity as tolerated using pain as her guide.  We did discuss formal physical therapy but given the current circumstances with the COVID-19 pandemic we will obviously defer that for now.  Once the pandemic comes to an end, we could reconsider this if needed.  Otherwise, follow-up for ongoing or recalcitrant issues.   Follow Up Instructions:    I discussed the assessment and treatment plan with the patient. The patient was provided an opportunity to ask questions and all were answered. The patient agreed with the plan and demonstrated an understanding of the instructions.   The patient was advised to call back or seek an in-person  evaluation if the symptoms worsen or if the condition fails to improve as anticipated.    Marsa Aris, DO

## 2019-04-16 ENCOUNTER — Other Ambulatory Visit: Payer: Self-pay | Admitting: Sports Medicine

## 2019-04-23 ENCOUNTER — Other Ambulatory Visit: Payer: Self-pay | Admitting: Physician Assistant

## 2019-04-23 NOTE — Telephone Encounter (Signed)
Losartan 25 mg refilled. 

## 2019-05-29 ENCOUNTER — Other Ambulatory Visit: Payer: Self-pay | Admitting: Physician Assistant

## 2019-09-03 ENCOUNTER — Ambulatory Visit: Payer: BC Managed Care – PPO | Admitting: Physician Assistant

## 2019-09-03 ENCOUNTER — Other Ambulatory Visit: Payer: Self-pay | Admitting: Physician Assistant

## 2019-09-03 NOTE — Progress Notes (Deleted)
Cardiology Office Note   Date:  09/03/2019   ID:  RAJ WHITMER, DOB 1956-07-05, MRN [id number]  PCP:  No primary care provider on file. Cardiologist:  Peter Swaz[place], MD he was DOD at her initial visit 12/06/2016 Theodore Demark, PA-C 11/16/2017    No chief complaint on file.   History of Present Illness: Rita Berry is a 63 y.o. female with a history of HTN  To reviewBP management:  Initially pt on Lasix>>Chlorthalidone started>>dose decreased after NTG patch started Side effects continued>>switched to clonidine>>more side effects>>cards referral  Did not tolerate amlodipine 2.5 mg. Did not tolerate Lasix 20 mg 2016/2017 Did not tolerate clonidine Did not tolerate chlorthalidone at 25 mg qd Hydralazine mentioned in PCP note 12/09/2016 but do not see that she was prescribed it  She has tolerated lisinopril 5 mg>10 mg>10 mg am, 30 mg pm BP tends to spike in the early afternoon/evening;uncontrolled blood pressure makes her nervous Most of the time her side effect is dizziness, she has seen specialists with no cause foundexcept [facility] 04/09 office visit, chlorthalidone 12.5 mg qod, lisinopril 15 mg am and 15 mg pm 05/02 office visit for dizziness and presyncope, lisinopril changed to losartan 25 mg bid and continue chlorthalidone 12.5 mg qod 01/05/2018 phone note, losartan 50 mg qd, 25 mg pm and chlorthalidone 12.5 mg at 10 am  Rita Bath B Berry presents for ***   Past Medical History:  Diagnosis Date  . Arthritis    hip right   . CIN I (cervical intraepithelial neoplasia I)   . Complication of anesthesia   . Essential hypertension   . Humerus distal fracture    left-08-16-16 surgery to repair  . Ovarian cyst   . PONV (postoperative nausea and vomiting)   . Pre-syncope    a. ? orthostasis/dehydration 03/2017.  Marland Kitchen Vertigo    "Positional vertigo"- head elevation best    Past Surgical History:  Procedure Laterality Date  . ABDOMINAL HYSTERECTOMY    . ABDOMINAL SURGERY      Ovarian cyst  . COLPOSCOPY    . GYNECOLOGIC CRYOSURGERY    . OOPHORECTOMY     BSO  . ORIF HUMERUS FRACTURE Left 08/16/2016   Procedure: OPEN TREATMENT OF LEFT DISTAL HUMERUS FRACTURE;  Surgeon: Mack Hook, MD;  Location: [facility];  Service: Orthopedics;  Laterality: Left;  GENERAL ANESTHESIA WITH PRE-OP BLOCK  . TOTAL HIP ARTHROPLASTY Right 10/18/2016   Procedure: RIGHT TOTAL HIP ARTHROPLASTY ANTERIOR APPROACH;  Surgeon: Durene Romans, MD;  Location: [facility];  Service: Orthopedics;  Laterality: Right;  . TUBAL LIGATION      Current Outpatient Medications  Medication Sig Dispense Refill  . AMBULATORY NON FORMULARY MEDICATION Baclofen 2% Cream Apply 2g topically two times daily. This is to be compounded with Diclofenac 5%, Gabapentin 6%, Ketamine 10%, Tetracaine 3%. 120 g 1  . b complex vitamins tablet Take 1 tablet by mouth daily.    . Calcium-Magnesium 500-250 MG TABS Take 1 tablet by mouth 2 (two) times daily.    . cetirizine (ZYRTEC) 10 MG tablet Take 1 tablet (10 mg total) by mouth daily. 90 tablet 0  . chlorthalidone (HYGROTON) 25 MG tablet Take 0.5 tablets (12.5 mg total) by mouth daily. NEED OV. 15 tablet 2  . Cholecalciferol (VITAMIN D) 2000 units CAPS Take 2,000 Units by mouth daily.    . fluticasone (FLONASE) 50 MCG/ACT nasal spray Place 2 sprays into both nostrils daily. 16 g 0  . Ketamine  HCl POWD APPLY 2 GRAMS TWICE A DAY. 120 g 3  . losartan (COZAAR) 25 MG tablet Take 1 tablet (25 mg total) by mouth 2 (two) times a day. 60 tablet 0  . Multiple Vitamin (MULTIVITAMIN) tablet Take 1 tablet by mouth daily. Reported on 06/14/2016     No current facility-administered medications for this visit.     Allergies:   Codeine and Latex    Social History:  The patient  reports that she quit smoking about 28 years ago. She has never used smokeless tobacco. She reports current alcohol use of about 9.0 standard drinks of alcohol per week. She reports that she does not  use drugs.   Family History:  The patient's family history includes Bradycardia in her mother; Dementia in her mother; Diabetes in her maternal grandmother and sister; Hyperlipidemia in her father; Hypertension in her father and mother; Stroke in her maternal grandfather and maternal grandmother.  She indicated that her mother is alive. She indicated that her father is alive. She indicated that her sister is alive. She indicated that her maternal grandmother is deceased. She indicated that her maternal grandfather is deceased. She indicated that her paternal grandmother is deceased. She indicated that her paternal grandfather is deceased.    ROS:  Please see the history of present illness. All other systems are reviewed and negative.    PHYSICAL EXAM: VS:  There were no vitals taken for this visit. , BMI There is no height or weight on file to calculate BMI. GEN: Well nourished, well developed, female in no acute distress HEENT: normal for age  Neck: no JVD, no carotid bruit, no masses Cardiac: RRR; no murmur, no rubs, or gallops Respiratory:  clear to auscultation bilaterally, normal work of breathing GI: soft, nontender, nondistended, + BS MS: no deformity or atrophy; no edema; distal pulses are 2+ in all 4 extremities  Skin: warm and dry, no rash Neuro:  Strength and sensation are intact Psych: euthymic mood, full affect   EKG:  EKG {ACTION; IS/IS [id number]}OT:[id number]} ordered today. The ekg ordered today demonstrates ***  ECHO: ***  CATH: ***  MONITOR: ***   Recent Labs: No results found for requested labs within last 8760 hours.  CBC    Component Value Date/Time   WBC 6.0 04/17/2017 1130   RBC 4.20 04/17/2017 1130   HGB 13.6 04/17/2017 1130   HCT 39.0 04/17/2017 1130   PLT 236 04/17/2017 1130   MCV 92.9 04/17/2017 1130   MCV 92.8 10/29/2016 1531   MCH 32.4 04/17/2017 1130   MCHC 34.9 04/17/2017 1130   RDW 12.8 04/17/2017 1130   LYMPHSABS 1.4 11/21/2016 1756   MONOABS  0.3 11/21/2016 1756   EOSABS 0.1 11/21/2016 1756   BASOSABS 0.0 11/21/2016 1756   CMP Latest Ref Rng & Units 11/16/2017 06/29/2017 04/17/2017  Glucose 65 - 99 mg/dL 85 88 98  BUN 8 - 27 mg/dL 8 13 12   Creatinine 0.57 - 1.00 mg/dL 9.810.61 1.91(Y0.54(L) 7.820.58  Sodium 134 - 144 mmol/L 137 135 131(L)  Potassium 3.5 - 5.2 mmol/L 4.5 4.6 3.5  Chloride 96 - 106 mmol/L 95(L) 93(L) 95(L)  CO2 20 - 29 mmol/L 28 27 29   Calcium 8.7 - 10.3 mg/dL 9.5 9.3 8.9  Total Protein 6.0 - 8.5 g/dL 7.2 - -  Total Bilirubin 0.0 - 1.2 mg/dL 0.6 - -  Alkaline Phos 39 - 117 IU/L 49 - -  AST 0 - 40 IU/L 27 - -  ALT 0 -  32 IU/L 23 - -     Lipid Panel Lab Results  Component Value Date   CHOL 255 (H) 11/16/2017   HDL 90 11/16/2017   LDLCALC 151 (H) 11/16/2017   TRIG 70 11/16/2017   CHOLHDL 2.8 11/16/2017      Wt Readings from Last 3 Encounters:  04/01/19 128 lb (58.1 kg)  02/11/19 130 lb (59 kg)  11/16/17 135 lb (61.2 kg)     Other studies Reviewed: Additional studies/ records that were reviewed today include: ***.  ASSESSMENT AND PLAN:  1.  ***   Current medicines are reviewed at length with the patient today.  The patient {ACTIONS; HAS/DOES NOT HAVE:19233} concerns regarding medicines.  The following changes have been made:  {PLAN; NO CHANGE:13088:s}  Labs/ tests ordered today include: *** No orders of the defined types were placed in this encounter.    Disposition:   FU with Peter Martinique, MD  Signed, Rosaria Ferries, PA-C  09/03/2019 7:47 AM    [place] Phone: [phone]; Fax: [phone]

## 2019-09-09 ENCOUNTER — Telehealth (INDEPENDENT_AMBULATORY_CARE_PROVIDER_SITE_OTHER): Payer: BC Managed Care – PPO | Admitting: Physician Assistant

## 2019-09-09 VITALS — BP 127/72 | HR 81 | Ht 67.0 in | Wt 130.0 lb

## 2019-09-09 DIAGNOSIS — I10 Essential (primary) hypertension: Secondary | ICD-10-CM

## 2019-09-09 DIAGNOSIS — E7800 Pure hypercholesterolemia, unspecified: Secondary | ICD-10-CM

## 2019-09-09 MED ORDER — LOSARTAN POTASSIUM 25 MG PO TABS: 25.0000 mg | ORAL_TABLET | Freq: Three times a day (TID) | ORAL | 3 refills | Status: DC

## 2019-09-09 MED ORDER — CHLORTHALIDONE 25 MG PO TABS: 12.5000 mg | ORAL_TABLET | Freq: Every day | ORAL | 2 refills | Status: DC

## 2019-09-09 NOTE — Patient Instructions (Signed)
Medication Instructions:  Your physician recommends that you continue on your current medications as directed. Please refer to the Current Medication list given to you today.'  If you need a refill on your cardiac medications before your next appointment, please call your pharmacy.   Lab work: Your physician recommends that you return for a FASTING lipid profile and CMET at your earliest convenience. Your lab slips will be mailed out to you today. You do not need an appointment for bloodwork done in our office.  If you have labs (blood work) drawn today and your tests are completely normal, you will receive your results only by: Marland Kitchen MyChart Message (if you have MyChart) OR . A paper copy in the mail If you have any lab test that is abnormal or we need to change your treatment, we will call you to review the results.   Follow-Up: At [facility], you and your health needs are our priority.  As part of our continuing mission to provide you with exceptional heart care, we have created designated Provider Care Teams.  These Care Teams include your primary Cardiologist (physician) and Advanced Practice Providers (APPs -  Physician Assistants and Nurse Practitioners) who all work together to provide you with the care you need, when you need it. . Please follow-up with Rosaria Ferries, PA in 1 year. Please call to schedule this appointment 2 months in advance.

## 2019-09-09 NOTE — Progress Notes (Signed)
Virtual Visit via Video Note   This visit type was conducted due to national recommendations for restrictions regarding the COVID-19 Pandemic (e.g. social distancing) in an effort to limit this patient's exposure and mitigate transmission in our community.  Due to her co-morbid illnesses, this patient is at least at moderate risk for complications without adequate follow up.  This format is felt to be most appropriate for this patient at this time.  All issues noted in this document were discussed and addressed.  A limited physical exam was performed with this format.  Please refer to the patient's chart for her consent to telehealth for [facility].  Evaluation Performed:  Follow-up visit  This visit type was conducted due to national recommendations for restrictions regarding the COVID-19 Pandemic (e.g. social distancing).  This format is felt to be most appropriate for this patient at this time.  All issues noted in this document were discussed and addressed.  No physical exam was performed (except for noted visual exam findings with Video Visits).  Please refer to the patient's chart (MyChart message for video visits and phone note for telephone visits) for the patient's consent to telehealth for [facility].  Date:  09/09/2019   ID:  Rita Berry, DOB 08-28-1956, MRN [id number]  Patient Location:  Home  Provider location:   Off-site  PCP:  Porfirio OarJeffery, [place], PA  Cardiologist:  Peter SwazilandJordan, MD (he was DOD at her initial visit 2017, she has not actually seen him) R.  Shiro Ellerman, PA-C, 11/16/2017 Electrophysiologist:  None   Chief Complaint: Yearly follow-up  History of Present Illness:    Rita Lowerseresa B Campi is a 63 y.o. female who presents via Web designeraudio/video conferencing for a telehealth visit today.    63 y.o. yo female who has a hx of HTN, intolerant to multiple medications, HLD, not currently on a statin.  See 11/16/2017 note for more details.  Hx palpitations, but monitor  without arrhythmia  Her BP is more stable now. She takes the chlorthalidone at 5 am, losartan 25 mg at 10 am, 2 pm, 6 pm.  She feels that her blood pressure control is steady taking small doses of medicine several hours apart.  She is not having the spikes in the late afternoon that she was having and if she is having them, they are not as high.  She is not having any side effects on this medication regimen.  She is running a couple of mornings a week. No chest pain or SOB w/ this.  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  She does not wake with lower extremity edema.  She denies orthopnea or PND.  She is not having any palpitations.  No presyncope or syncope.  She knows her cholesterol is too high, but has been reluctant to take medication for it.  She has tried dietary changes.  She is also taking red yeast rice and cholesterol off.  She has not had labs done since she started both of these, but admits it is likely they are not that effective.  2 patients and sick staff members have tested positive for COVID, but they did not have anything to do with each other.  It is felt that the cases were separately acquired. The patient does not have symptoms concerning for COVID-19 infection (fever, chills, cough, or new shortness of breath).    Prior CV studies:   The following studies  were reviewed today:  14-day monitor: 12/2016 Sinus rhythm, no arrhythmias seen  Past Medical History:  Diagnosis Date  . Arthritis    hip right   . CIN I (cervical intraepithelial neoplasia I)   . Complication of anesthesia   . Essential hypertension   . Humerus distal fracture    left-08-16-16 surgery to repair  . Ovarian cyst   . PONV (postoperative nausea and vomiting)   . Pre-syncope    a. ? orthostasis/dehydration 03/2017.  Marland Kitchen. Vertigo    "Positional vertigo"- head elevation best   Past Surgical History:  Procedure Laterality Date  . ABDOMINAL  HYSTERECTOMY    . ABDOMINAL SURGERY     Ovarian cyst  . COLPOSCOPY    . GYNECOLOGIC CRYOSURGERY    . OOPHORECTOMY     BSO  . ORIF HUMERUS FRACTURE Left 08/16/2016   Procedure: OPEN TREATMENT OF LEFT DISTAL HUMERUS FRACTURE;  Surgeon: Mack Hookavid Thompson, MD;  Location: [facility];  Service: Orthopedics;  Laterality: Left;  GENERAL ANESTHESIA WITH PRE-OP BLOCK  . TOTAL HIP ARTHROPLASTY Right 10/18/2016   Procedure: RIGHT TOTAL HIP ARTHROPLASTY ANTERIOR APPROACH;  Surgeon: Durene RomansMatthew Olin, MD;  Location: [facility];  Service: Orthopedics;  Laterality: Right;  . TUBAL LIGATION       Current Meds  Medication Sig  . AMBULATORY NON FORMULARY MEDICATION Baclofen 2% Cream Apply 2g topically two times daily. This is to be compounded with Diclofenac 5%, Gabapentin 6%, Ketamine 10%, Tetracaine 3%.  . b complex vitamins tablet Take 1 tablet by mouth daily.  . Calcium-Magnesium 500-250 MG TABS Take 1 tablet by mouth 2 (two) times daily.  . cetirizine (ZYRTEC) 10 MG tablet Take 1 tablet (10 mg total) by mouth daily.  . chlorthalidone (HYGROTON) 25 MG tablet Take 0.5 tablets (12.5 mg total) by mouth daily. NEED OV.  Marland Kitchen. Cholecalciferol (VITAMIN D) 2000 units CAPS Take 2,000 Units by mouth daily.  . fluticasone (FLONASE) 50 MCG/ACT nasal spray Place 2 sprays into both nostrils daily.  Marland Kitchen. Ketamine HCl POWD APPLY 2 GRAMS TWICE A DAY.  Marland Kitchen. losartan (COZAAR) 25 MG tablet Take 1 tablet (25 mg total) by mouth 3 (three) times daily.  . Multiple Vitamin (MULTIVITAMIN) tablet Take 1 tablet by mouth daily. Reported on 06/14/2016  . Plant Sterols and Stanols (CHOLESTOFF PO) Take by mouth daily.  . [DISCONTINUED] chlorthalidone (HYGROTON) 25 MG tablet Take 0.5 tablets (12.5 mg total) by mouth daily. NEED OV.  . [DISCONTINUED] losartan (COZAAR) 25 MG tablet Take 25 mg by mouth 3 (three) times daily.     Allergies:   Codeine and Latex   Social History   Tobacco Use  . Smoking status: Former Smoker    Quit date:  10/10/1990    Years since quitting: 28.9  . Smokeless tobacco: Never Used  Substance Use Topics  . Alcohol use: Yes    Alcohol/week: 9.0 standard drinks    Types: 5 Glasses of wine, 4 Standard drinks or equivalent per week    Comment: social  . Drug use: No     Family Hx: The patient's family history includes Bradycardia in her mother; Dementia in her mother; Diabetes in her maternal grandmother and sister; Hyperlipidemia in her father; Hypertension in her father and mother; Stroke in her maternal grandfather and maternal grandmother.  ROS:   Please see the history of present illness.    All other systems reviewed and are negative.   Labs/Other Tests and Data Reviewed:    Recent Labs: No  results found for requested labs within last 8760 hours.   CBC    Component Value Date/Time   WBC 6.0 04/17/2017 1130   RBC 4.20 04/17/2017 1130   HGB 13.6 04/17/2017 1130   HCT 39.0 04/17/2017 1130   PLT 236 04/17/2017 1130   MCV 92.9 04/17/2017 1130   MCV 92.8 10/29/2016 1531   MCH 32.4 04/17/2017 1130   MCHC 34.9 04/17/2017 1130   RDW 12.8 04/17/2017 1130   LYMPHSABS 1.4 11/21/2016 1756   MONOABS 0.3 11/21/2016 1756   EOSABS 0.1 11/21/2016 1756   BASOSABS 0.0 11/21/2016 1756    CMP Latest Ref Rng & Units 11/16/2017 06/29/2017 04/17/2017  Glucose 65 - 99 mg/dL 85 88 98  BUN 8 - 27 mg/dL 8 13 12   Creatinine 0.57 - 1.00 mg/dL 0.61 0.54(L) 0.58  Sodium 134 - 144 mmol/L 137 135 131(L)  Potassium 3.5 - 5.2 mmol/L 4.5 4.6 3.5  Chloride 96 - 106 mmol/L 95(L) 93(L) 95(L)  CO2 20 - 29 mmol/L 28 27 29   Calcium 8.7 - 10.3 mg/dL 9.5 9.3 8.9  Total Protein 6.0 - 8.5 g/dL 7.2 - -  Total Bilirubin 0.0 - 1.2 mg/dL 0.6 - -  Alkaline Phos 39 - 117 IU/L 49 - -  AST 0 - 40 IU/L 27 - -  ALT 0 - 32 IU/L 23 - -     Recent Lipid Panel Lab Results  Component Value Date/Time   CHOL 255 (H) 11/16/2017 04:08 PM   TRIG 70 11/16/2017 04:08 PM   HDL 90 11/16/2017 04:08 PM   CHOLHDL 2.8 11/16/2017  04:08 PM   CHOLHDL 2.5 06/14/2016 04:51 PM   LDLCALC 151 (H) 11/16/2017 04:08 PM    Wt Readings from Last 3 Encounters:  09/09/19 130 lb (59 kg)  04/01/19 128 lb (58.1 kg)  02/11/19 130 lb (59 kg)     Objective:    Vital Signs:  BP 127/72   Pulse 81   Ht 5\' 7"  (1.702 m)   Wt 130 lb (59 kg)   BMI 20.36 kg/m    63 y.o. female in no acute distress.   ASSESSMENT & PLAN:    1.  Hypertension: - Continue current regimen without changes. -Spreading the medications out like she is keeps her blood pressure controlled and stable, but without the dips or spikes she was having before. -No med changes  2.  Hyperlipidemia: -She tries to eat a healthy diet, but admits she is not compliant 100% of the time. - She is taking Colestoff and red yeast rice, but admits the data supporting them are not robust - Her PCP changed practices and she has not yet gotten another one.  She thinks that she will go ahead and stay with Ms. Gorden Harms in her new practice. -Until then, we can follow her cholesterol.  It has been over a year since it was checked, she is encouraged to come in this week or next week and get it done. - She has been reluctant to take statins, but has realized that it might be necessary to get her cholesterol down.  3.  Palpitations - They have resolved, TSH has been normal in the past.  COVID-19 Education: The signs and symptoms of COVID-19 were discussed with the patient and how to seek care for testing (follow up with PCP or arrange E-visit).  The importance of social distancing was discussed today.  Patient Risk:   After full review of this patient's clinical status, I feel that  they are at least moderate risk at this time.  Time:   Today, I have spent 20 minutes with the patient with telehealth technology discussing cardiac and other health issues.     Medication Adjustments/Labs and Tests Ordered: Current medicines are reviewed at length with the patient today.  Concerns  regarding medicines are outlined above.  Tests Ordered: Orders Placed This Encounter  Procedures  . Comprehensive metabolic panel  . Lipid panel   Medication Changes: Meds ordered this encounter  Medications  . chlorthalidone (HYGROTON) 25 MG tablet    Sig: Take 0.5 tablets (12.5 mg total) by mouth daily. NEED OV.    Dispense:  15 tablet    Refill:  2  . losartan (COZAAR) 25 MG tablet    Sig: Take 1 tablet (25 mg total) by mouth 3 (three) times daily.    Dispense:  270 tablet    Refill:  3    Disposition:  Follow up with Peter Swaziland, MD   Signed, Theodore Demark, PA-C  09/09/2019 3:58 PM    [facility]

## 2019-09-13 LAB — LIPID PANEL
Chol/HDL Ratio: 2.6 ratio (ref 0.0–4.4)
Cholesterol, Total: 250 mg/dL — ABNORMAL HIGH (ref 100–199)
HDL: 95 mg/dL (ref >39)
LDL Chol Calc (NIH): 135 mg/dL — ABNORMAL HIGH (ref 0–99)
Triglycerides: 119 mg/dL (ref 0–149)
VLDL Cholesterol Cal: 20 mg/dL (ref 5–40)

## 2019-09-13 LAB — COMPREHENSIVE METABOLIC PANEL
ALT: 19 IU/L (ref 0–32)
AST: 24 IU/L (ref 0–40)
Albumin/Globulin Ratio: 2 (ref 1.2–2.2)
Albumin: 4.7 g/dL (ref 3.8–4.8)
Alkaline Phosphatase: 55 IU/L (ref 39–117)
BUN/Creatinine Ratio: 15 (ref 12–28)
BUN: 8 mg/dL (ref 8–27)
Bilirubin Total: 0.7 mg/dL (ref 0.0–1.2)
CO2: 27 mmol/L (ref 20–29)
Calcium: 9.6 mg/dL (ref 8.7–10.3)
Chloride: 94 mmol/L — ABNORMAL LOW (ref 96–106)
Creatinine, Ser: 0.54 mg/dL — ABNORMAL LOW (ref 0.57–1.00)
GFR calc Af Amer: 115 mL/min/{1.73_m2} (ref >59)
GFR calc non Af Amer: 99 mL/min/{1.73_m2} (ref >59)
Globulin, Total: 2.4 g/dL (ref 1.5–4.5)
Glucose: 84 mg/dL (ref 65–99)
Potassium: 4.3 mmol/L (ref 3.5–5.2)
Sodium: 134 mmol/L (ref 134–144)
Total Protein: 7.1 g/dL (ref 6.0–8.5)

## 2019-09-18 ENCOUNTER — Telehealth: Payer: Self-pay | Admitting: Physician Assistant

## 2019-09-18 NOTE — Telephone Encounter (Signed)
Patient states she got a call from our office yesterday in regards to an offer to try some cholesterol medication. She was given some options , and was told that someone from the office would call her yesterday. She has not received a call , and wanted to know when the office would call back.  Please advise

## 2019-09-18 NOTE — Telephone Encounter (Signed)
Spoke with patient and advised deferred to Palmer B PA for cholesterol treatment. Per patient she is leaning towards Zocor at lowest dose (prefers 5 mg) every other day Will forward to [place] for review

## 2019-09-23 ENCOUNTER — Other Ambulatory Visit: Payer: Self-pay | Admitting: Physician Assistant

## 2019-09-23 DIAGNOSIS — E7800 Pure hypercholesterolemia, unspecified: Secondary | ICD-10-CM

## 2019-09-23 MED ORDER — SIMVASTATIN 20 MG PO TABS: ORAL_TABLET | ORAL | 3 refills | Status: DC

## 2019-10-07 MED ORDER — ROSUVASTATIN CALCIUM 10 MG PO TABS: ORAL_TABLET | ORAL | 5 refills | Status: DC

## 2019-10-18 ENCOUNTER — Other Ambulatory Visit: Payer: Self-pay

## 2019-10-18 MED ORDER — CHLORTHALIDONE 25 MG PO TABS: 12.5000 mg | ORAL_TABLET | Freq: Every day | ORAL | 2 refills | Status: DC

## 2019-12-28 ENCOUNTER — Other Ambulatory Visit: Payer: Self-pay | Admitting: Sports Medicine

## 2020-01-16 ENCOUNTER — Ambulatory Visit: Payer: BC Managed Care – PPO | Admitting: Family Medicine

## 2020-01-16 VITALS — BP 138/84 | HR 71

## 2020-01-16 DIAGNOSIS — Z789 Other specified health status: Secondary | ICD-10-CM

## 2020-01-16 NOTE — Progress Notes (Signed)
Subjective:     Patient ID: Rita Berry, female   DOB: 12-26-1955, 64 y.o.   MRN: [id number]  HPI   Rita Berry presents [facility] the [facility] health clinic today for her required wellness visit for her insurance. She has a PCP, Rita Oar, PA, who she saw a couple months ago for her physical. Her PMH and health maintenance items were reviewed. She has had a hysterectomy. She is UTD on her colonoscopy, last done in 2019, she states she was told to repeat in 5 years. She has had the two shingrix doses, as well as PNA and Tdap vaccines. Her mammogram is due next month.   Past Medical History:  Diagnosis Date  . Arthritis    hip right   . CIN I (cervical intraepithelial neoplasia I)   . Complication of anesthesia   . Essential hypertension   . Humerus distal fracture    left-08-16-16 surgery to repair  . Ovarian cyst   . PONV (postoperative nausea and vomiting)   . Pre-syncope    a. ? orthostasis/dehydration 03/2017.  Marland Kitchen Vertigo    "Positional vertigo"- head elevation best   Allergies  Allergen Reactions  . Codeine Nausea And Vomiting  . Latex Itching    Current Outpatient Medications:  .  b complex vitamins tablet, Take 1 tablet by mouth daily., Disp: , Rfl:  .  Calcium-Magnesium 500-250 MG TABS, Take 1 tablet by mouth 2 (two) times daily., Disp: , Rfl:  .  chlorthalidone (HYGROTON) 25 MG tablet, Take 0.5 tablets (12.5 mg total) by mouth daily. NEED OV., Disp: 15 tablet, Rfl: 2 .  Cholecalciferol (VITAMIN D) 2000 units CAPS, Take 2,000 Units by mouth daily., Disp: , Rfl:  .  losartan (COZAAR) 25 MG tablet, Take 1 tablet (25 mg total) by mouth 3 (three) times daily., Disp: 270 tablet, Rfl: 3 .  Multiple Vitamin (MULTIVITAMIN) tablet, Take 1 tablet by mouth daily. Reported on 06/14/2016, Disp: , Rfl:  .  rosuvastatin (CRESTOR) 10 MG tablet, Take 1 tablet by mouth on Monday & Friday only., Disp: 10 tablet, Rfl: 5 .  AMBULATORY NON FORMULARY MEDICATION, Baclofen 2% Cream Apply 2g topically two  times daily. This is to be compounded with Diclofenac 5%, Gabapentin 6%, Ketamine 10%, Tetracaine 3%., Disp: 120 g, Rfl: 1 .  cetirizine (ZYRTEC) 10 MG tablet, Take 1 tablet (10 mg total) by mouth daily., Disp: 90 tablet, Rfl: 0 .  fluticasone (FLONASE) 50 MCG/ACT nasal spray, Place 2 sprays into both nostrils daily., Disp: 16 g, Rfl: 0 .  Ketamine HCl POWD, APPLY 2 GRAMS TWICE A DAY., Disp: 120 g, Rfl: 3 .  Plant Sterols and Stanols (CHOLESTOFF PO), Take by mouth daily., Disp: , Rfl:    Review of Systems  Constitutional: Negative for chills, fatigue, fever and unexpected weight change.  HENT: Negative for congestion, ear pain, sinus pressure, sinus pain and sore throat.   Eyes: Negative for discharge and visual disturbance.  Respiratory: Negative for cough, shortness of breath and wheezing.   Cardiovascular: Negative for chest pain and leg swelling.  Gastrointestinal: Negative for abdominal pain, blood in stool, constipation, diarrhea, nausea and vomiting.  Genitourinary: Negative for difficulty urinating and hematuria.  Skin: Negative for color change.  Neurological: Negative for dizziness, weakness, light-headedness and headaches.  Hematological: Negative for adenopathy.  All other systems reviewed and are negative.      Objective:   Physical Exam Vitals and nursing note reviewed.  Constitutional:      General: She is  not in acute distress.    Appearance: She is well-developed.  HENT:     Head: Normocephalic and atraumatic.  Cardiovascular:     Rate and Rhythm: Normal rate and regular rhythm.     Heart sounds: Normal heart sounds. No murmur.  Pulmonary:     Effort: Pulmonary effort is normal. No respiratory distress.     Breath sounds: Normal breath sounds.  Musculoskeletal:     Cervical back: Neck supple.  Skin:    General: Skin is warm and dry.  Neurological:     Mental Status: She is alert and oriented to person, place, and time.  Psychiatric:        Mood and Affect:  Mood normal.        Behavior: Behavior normal.        Assessment:     Participant in health and wellness plan      Plan:     1. Pt scheduled for onsite mobile mammogram unit on 3/25. Keep all f/u appt with PCP. Encouraged healthy diet and exercise. F/u here prn.

## 2020-02-18 ENCOUNTER — Ambulatory Visit (INDEPENDENT_AMBULATORY_CARE_PROVIDER_SITE_OTHER): Payer: BC Managed Care – PPO | Admitting: Sports Medicine

## 2020-02-18 ENCOUNTER — Other Ambulatory Visit: Payer: Self-pay

## 2020-02-18 ENCOUNTER — Encounter: Payer: Self-pay | Admitting: Sports Medicine

## 2020-02-18 VITALS — BP 120/72 | Ht 67.0 in | Wt 135.0 lb

## 2020-02-18 DIAGNOSIS — G5701 Lesion of sciatic nerve, right lower limb: Secondary | ICD-10-CM

## 2020-02-18 DIAGNOSIS — M533 Sacrococcygeal disorders, not elsewhere classified: Secondary | ICD-10-CM | POA: Diagnosis not present

## 2020-02-18 NOTE — Patient Instructions (Addendum)
It was great to see you! 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  Our plans for today:  - We are referring you to physical therapy for rehab and muscle strengthening. - Do the home exercises as prescribed. - Continue to use tylenol and compound cream as needed.  Take care and seek immediate care sooner if you develop any concerns.   Dr. Linwood Dibbles

## 2020-02-18 NOTE — Progress Notes (Signed)
    SUBJECTIVE:   CHIEF COMPLAINT: R buttock pain  HPI:   Previously seen for right hip pain secondary to greater trochanteric pain syndrome. Today, endorsing R buttock pain for the past 4-6 months that is worse with the first step of the morning or after sitting for prolonged periods. 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 Denies known inciting injury. Denies skin changes, swelling. Denies fevers, recent infections, steroid injections recently. Denies difficulties with urinating, stooling. She is postmenopausal. She has taken tylenol, excedrin, and a compound pain cream with relief. Has been doing some glut and hamstring stretching at home.   PERTINENT  PMH / PSH: HTN, diverticulosis, ovarian cyst, hyperparathyroidism, osteopenia, IT band syndrome, s/p THR 2017  OBJECTIVE:   BP 120/72   Ht 5\' 7"  (1.702 m)   Wt 135 lb (61.2 kg)   BMI 21.14 kg/m   Gen: well appearing, in NAD MSK:  R hip/buttock: Inspection: no asymmetry in bulk or tone.  Palpation: TTP over R SI joint and ILA. TTP over R piriformis. ROM: full ROM in flexion, extension, abduction Strength: 4/5 in abduction bilaterally. 5/5 flexion, extension.  Special Tests: - positive R sided Trendelenburg. Neurovascular: intact.  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 Will provide home strengthening exercises as well as referral to formal physical therapy. Continue to use prn OTC pain relief. Follow up prn.  , DO PGY-3, [facility] 02/18/2020 3:51 PM   Patient seen and evaluated with the resident. I agree with the above plan of care. Given the patient's positive Trendelenburg, we will start her on a strengthening program. Also discussed the possibility of referral to formal physical therapy. Patient would like to pursue  that as well. She may graduate to a home exercise program per the discretion of the physical therapist and will follow up with me as needed.

## 2020-03-09 ENCOUNTER — Encounter: Payer: Self-pay | Admitting: Physical Therapy

## 2020-03-09 ENCOUNTER — Ambulatory Visit: Payer: PRIVATE HEALTH INSURANCE | Attending: Sports Medicine | Admitting: Physical Therapy

## 2020-03-09 ENCOUNTER — Other Ambulatory Visit: Payer: Self-pay

## 2020-03-09 DIAGNOSIS — M6281 Muscle weakness (generalized): Secondary | ICD-10-CM | POA: Diagnosis present

## 2020-03-09 DIAGNOSIS — M25551 Pain in right hip: Secondary | ICD-10-CM | POA: Insufficient documentation

## 2020-03-09 NOTE — Therapy (Addendum)
[place], [place], [address] Phone: [phone]   Fax:  [phone]  Physical Therapy Evaluation / Discharge  Patient Details  Name: Rita Berry MRN: [id number] Date of Birth: Oct 19, 1956 Referring Provider (PT): Thurman Coyer, DO    Encounter Date: 03/09/2020  PT End of Session - 03/09/20 1326    Visit Number  1    Number of Visits  13    Date for PT Re-Evaluation  04/06/20    PT Start Time  1330    PT Stop Time  1414    PT Time Calculation (min)  44 min    Activity Tolerance  Patient tolerated treatment well    Behavior During Therapy  [facility] for tasks assessed/performed       Past Medical History:  Diagnosis Date  . Arthritis    hip right   . CIN I (cervical intraepithelial neoplasia I)   . Complication of anesthesia   . Essential hypertension   . Humerus distal fracture    left-08-16-16 surgery to repair  . Ovarian cyst   . PONV (postoperative nausea and vomiting)   . Pre-syncope    a. ? orthostasis/dehydration 03/2017.  Marland Kitchen Vertigo    "Positional vertigo"- head elevation best    Past Surgical History:  Procedure Laterality Date  . ABDOMINAL HYSTERECTOMY    . ABDOMINAL SURGERY     Ovarian cyst  . COLPOSCOPY    . GYNECOLOGIC CRYOSURGERY    . OOPHORECTOMY     BSO  . ORIF HUMERUS FRACTURE Left 08/16/2016   Procedure: OPEN TREATMENT OF LEFT DISTAL HUMERUS FRACTURE;  Surgeon: Milly Jakob, MD;  Location: [facility];  Service: Orthopedics;  Laterality: Left;  GENERAL ANESTHESIA WITH PRE-OP BLOCK  . TOTAL HIP ARTHROPLASTY Right 10/18/2016   Procedure: RIGHT TOTAL HIP ARTHROPLASTY ANTERIOR APPROACH;  Surgeon: Paralee Cancel, MD;  Location: [facility];  Service: Orthopedics;  Laterality: Right;  . TUBAL LIGATION      There were no vitals filed for this visit.   Subjective Assessment - 03/09/20 1335    Subjective  pt is a 64 y.o F with CC R posterior hip pain that started over  the winter which she attributes to wearing tight leggins with increased tension in the wasit band and it rubs right over the R SIJ, with no other specific MOI. She has been consistent with her HEP that was given by Dr Prentice Docker at his office and reports she is doing much better    How long can you sit comfortably?  unlimited    How long can you stand comfortably?  unlimited    How long can you walk comfortably?  unlimited    Patient Stated Goals  to dercrease pain    Currently in Pain?  Yes    Pain Score  --   less than a one   Pain Location  Hip    Pain Orientation  Right;Posterior    Pain Onset  More than a month ago    Pain Frequency  Occasional    Aggravating Factors   going up the hill and stairs    Pain Relieving Factors  exercise         [facility] PT Assessment - 03/09/20 1328      Assessment   Medical Diagnosis  SI (sacroiliac) joint dysfunction M53.3, Piriformis syndrome of right side G57.01    Referring Provider (PT)  Thurman Coyer, DO     Onset  Date/Surgical Date  --   a few months ago     Precautions   Precautions  None      Restrictions   Weight Bearing Restrictions  No      ROM / Strength   AROM / PROM / Strength  AROM;Strength      AROM   Overall AROM   Within functional limits for tasks performed      Strength   Strength Assessment Site  Hip    Right/Left Hip  Right;Left    Right Hip Flexion  4-/5    Right Hip ABduction  4/5    Left Hip Flexion  4/5    Left Hip ABduction  4/5      Palpation   SI assessment   TTP along the R SIJ and special testing is consistent for anterior rotation deficit.     Palpation comment  TTP along the R SIJ      Special Tests    Special Tests  Sacrolliac Tests    Sacroiliac Tests   Sacral Thrust   forward flexoin test (+) on R      Sacral thrust    Findings  Positive    Side  Right      Gaenslen's test   Findings  Positive    Side   Right                Objective measurements completed on examination:  See above findings.      [facility] Treatment/Exercise - 03/09/20 0001      Exercises   Exercises  Knee/Hip      Knee/Hip Exercises: Stretches   Active Hamstring Stretch  3 reps;Right   PNF contract/ relax     Knee/Hip Exercises: Supine   Straight Leg Raises  Strengthening;Both;1 set;10 reps   with 10 oscillations at mid range each rep   Straight Leg Raises Limitations  increased weakness R compared to L             PT Education - 03/09/20 1731    Education Details  evaluation findings, POC, goals, HEP with proper form / rationale    Person(s) Educated  Patient    Methods  Explanation;Verbal cues;Handout    Comprehension  Verbalized understanding;Verbal cues required       PT Short Term Goals - 03/09/20 1736      PT SHORT TERM GOAL #1   Title  pt to be I with inital HEP    Time  3    Period  Weeks    Status  New    Target Date  03/30/20        PT Long Term Goals - 03/09/20 1737      PT LONG TERM GOAL #1   Title  increase R hip strength to >/= 4+/5 to promote SIJ stability      PT LONG TERM GOAL #2   Title  pt to reprot no pain/ limitations with ADLS for improvement of condition and QOL    Time  4    Period  Weeks    Status  New    Target Date  04/06/20      PT LONG TERM GOAL #3   Title  increase FOTO score to </= 30% limited to demo improvement  in function    Time  4    Period  Weeks    Status  New    Target Date  04/06/20      PT  LONG TERM GOAL #4   Title  pt to be I with all HEP given as of last visit to maintain and progress current level of function independently    Time  4    Period  Weeks    Status  New    Target Date  04/06/20      PT LONG TERM GOAL #5   Title  -    Baseline  -             Plan - 03/09/20 1415    Clinical Impression Statement  pt presents to OPPT with CC of R posterior hip/ SIJ pain start a couple of months ago with no specific MOI. she has functional hip ROM and gross weakness in the R hp compared bil.  TTP along the R SIJ and special testing is consistent for anterior rotation deficit. She responded well to hamstring stretching and hip flexor activation, and reported no pain and improvement in mobility following session. She would benefit from 1-2 visits to helped finalize HEP, promote hip knee strength and SIJ stability, and return pt back to PLOF by addressing the deficits listed.    Stability/Clinical Decision Making  Stable/Uncomplicated    Clinical Decision Making  Low    Rehab Potential  Good    PT Frequency  1x / week    PT Duration  4 weeks    PT Treatment/Interventions  ADLs/Self Care Home Management;Cryotherapy;Electrical Stimulation;Iontophoresis 36m/ml Dexamethasone;Moist Heat;Ultrasound;Gait training;Stair training;Therapeutic activities;Therapeutic exercise;Balance training;Neuromuscular re-education;Manual techniques;Dry needling;Passive range of motion;Taping    PT Next Visit Plan  review HEP update PRN, anterior rotation deficit. hip ROM, lifting mechaincs, If doing well D/C    PT Home Exercise Plan  [id number] - hamstring stretch (supine/ seated), SLR with oscillations, standing 4-way hip    Consulted and Agree with Plan of Care  Patient       Patient will benefit from skilled therapeutic intervention in order to improve the following deficits and impairments:  Pain, Improper body mechanics, Postural dysfunction, Decreased strength  Visit Diagnosis: Pain in right hip  Muscle weakness (generalized)     Problem List Patient Active Problem List   Diagnosis Date Noted  . SI (sacroiliac) joint dysfunction 02/18/2020  . Piriformis syndrome of right side 02/18/2020  . Greater trochanteric pain syndrome of left lower extremity 02/11/2019  . 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 Osteoarthritis of right hip 06/14/2016  . Essential hypertension 06/12/2015  . Diverticulosis 06/12/2015  . Pain in joint, ankle and foot 09/16/2014  . Bilateral bunions 09/16/2014  . Iliotibial band  syndrome of left side 10/30/2013  . Greater trochanteric bursitis of both hips 10/30/2013  . Abnormality of gait 06/18/2013  . Eucalcemic Hyperparathyroidism 04/19/2013  . Osteopenia 04/19/2013  . CIN I (cervical intraepithelial neoplasia I)   . Ovarian cyst    KStarr LakePT, DPT, LAT, ATC  03/09/20  5:41 PM      [facility]GB[address] [address]Phone: [phone]  Fax:  [phone] Name: TTIANNA BAUSMRN: [id number] of Birth: 7Aug 17, 1957    PHYSICAL THERAPY DISCHARGE SUMMARY  Visits from Start of Care: 1  Current functional level related to goals / functional outcomes: See goals   Remaining deficits: Pt called cancelling her follow-up appointments because she was doing better.    Education / Equipment: HEP.   Plan: Patient agrees to discharge.  Patient goals were not met. Patient is being discharged  due to being pleased with the current functional level.  ?????         Kimora Stankovic PT, DPT, LAT, ATC  04/28/20  1:35 PM

## 2020-03-30 ENCOUNTER — Encounter: Payer: BC Managed Care – PPO | Admitting: Physical Therapy

## 2020-04-01 ENCOUNTER — Other Ambulatory Visit: Payer: Self-pay | Admitting: Pharmacist

## 2020-04-02 NOTE — Telephone Encounter (Signed)
This is Dr. Jordan pt. °

## 2020-05-01 ENCOUNTER — Other Ambulatory Visit: Payer: Self-pay | Admitting: Physician Assistant

## 2020-07-02 ENCOUNTER — Ambulatory Visit: Payer: PRIVATE HEALTH INSURANCE | Admitting: Sports Medicine

## 2020-08-16 ENCOUNTER — Other Ambulatory Visit: Payer: Self-pay | Admitting: Physician Assistant

## 2020-08-25 ENCOUNTER — Other Ambulatory Visit: Payer: Self-pay

## 2020-09-25 ENCOUNTER — Other Ambulatory Visit: Payer: Self-pay | Admitting: Physician Assistant

## 2020-10-26 MED ORDER — LOSARTAN POTASSIUM 25 MG PO TABS: ORAL_TABLET | ORAL | 0 refills | Status: DC

## 2020-10-26 NOTE — Addendum Note (Signed)
Addended by: Alyson Ingles on: 10/26/2020 10:47 AM   Modules accepted: Orders

## 2020-10-27 ENCOUNTER — Telehealth: Payer: Self-pay

## 2020-10-27 NOTE — Telephone Encounter (Addendum)
**Note De-Identified Odilon Cass Obfuscation** I started a Losartan PA through covermymeds: Key: [id number]

## 2020-10-31 NOTE — Progress Notes (Deleted)
{Choose 1 Note Type (Telehealth Visit or Telephone Visit):[phone]} Virtual platform was offered given ongoing worsening Covid-19 pandemic.  Date:  10/31/2020   ID:  Rita Berry, DOB Jun 20, 1956, MRN [id number]  Patient Location: Home Provider Location: [facility]  PCP:  Porfirio Oar, PA  Cardiologist:  Peter Swaziland, MD  Electrophysiologist:  None   Evaluation Performed:  Follow-Up Visit  Chief Complaint: follow-up of hypertension  History of Present Illness:    Rita Berry is a 64 y.o. female with a history of palpitations but no arrhythmias noted on monitor in 2018, hypertension, hyperlipidemia who presents today for routine follow-up.  Patient was initially seen in 11/2016 for further evaluation of dizziness in the setting of low BP. Patient had tolerated chlorthalidone for hypertension for years but developed side effects and low BP when Nitro patch was added to increase perfusion prior to hip surgery. Therefore, Chlorthalidone was discontinued and she was started on Clonidine as needed for BP spikes. Patient was no longer on Nitro patch at time of this visit so decision made to restart Chlorthalidone. BP medications have been continued to be adjusted over the years. She now takes Chlorthalidone 12.5mg  at 5am and then take Losartan 25mg  three times daily at 10am, 2pm, and 6pm. Patient was last seen for a virtual visit in 08/2019 at which time she stated that her BP was well controlled on this regimen and steady taking small dose of medicine several hours of apart. This has helped her BP spikes in the late afternoon. She also has hyperlipidemia but had not wanted to start a statin in the past. However, she was agreeable to starting Crestor 10mg  daily last year when cholesterol remained elevated.  Patient presents today for follow-up. ***  Hypertension - - Continue current medications: Chlorthalidone 12.5mg  daily and Losartan 25mg  three times daily at 10am, 2pm, and 6pm.  -  Will check CMET.  Hyperlipidemia - Lipid panel in 08/2019: Total Cholesterol 250, Triglycerides 119, HDL 95, LDL 135. - Continue Crestor 10mg  daily.  - Will have patient come to back office for fasting lipid panel and CMET.  Palpitations - History of palpitations. Monitor in 2018 showed no arrhythmias.  - No recurrence. ***    The patient {does/does not:200015} have symptoms concerning for COVID-19 infection (fever, chills, cough, or new shortness of breath).    Past Medical History:  Diagnosis Date  . Arthritis    hip right   . CIN I (cervical intraepithelial neoplasia I)   . Complication of anesthesia   . Essential hypertension   . Humerus distal fracture    left-08-16-16 surgery to repair  . Ovarian cyst   . PONV (postoperative nausea and vomiting)   . Pre-syncope    a. ? orthostasis/dehydration 03/2017.  Vertigo    "Positional vertigo"- head elevation best   Past Surgical History:  Procedure Laterality Date  . ABDOMINAL HYSTERECTOMY    . ABDOMINAL SURGERY     Ovarian cyst  . COLPOSCOPY    . GYNECOLOGIC CRYOSURGERY    . OOPHORECTOMY     BSO  . ORIF HUMERUS FRACTURE Left 08/16/2016   Procedure: OPEN TREATMENT OF LEFT DISTAL HUMERUS FRACTURE;  Surgeon: 05-07-1986, MD;  Location: [facility];  Service: Orthopedics;  Laterality: Left;  GENERAL ANESTHESIA WITH PRE-OP BLOCK  . TOTAL HIP ARTHROPLASTY Right 10/18/2016   Procedure: RIGHT TOTAL HIP ARTHROPLASTY ANTERIOR APPROACH;  Surgeon: Marland Kitchen, MD;  Location: [facility];  Service: Orthopedics;  Laterality: Right;  .  TUBAL LIGATION       No outpatient medications have been marked as taking for the 11/04/20 encounter (Appointment) with Corrin Parker, PA-C.     Allergies:   Codeine and Latex   Social History   Tobacco Use  . Smoking status: Former Smoker    Quit date: 10/10/1990    Years since quitting: 30.0  . Smokeless tobacco: Never Used  Vaping Use  . Vaping Use: Former  Substance Use  Topics  . Alcohol use: Yes    Alcohol/week: 9.0 standard drinks    Types: 5 Glasses of wine, 4 Standard drinks or equivalent per week    Comment: social  . Drug use: No     Family Hx: The patient's family history includes Bradycardia in her mother; Dementia in her mother; Diabetes in her maternal grandmother and sister; Hyperlipidemia in her father; Hypertension in her father and mother; Stroke in her maternal grandfather and maternal grandmother.  ROS:   Please see the history of present illness.    All other systems reviewed and are negative.   Prior CV studies:    The following studies were reviewed today:   Event Monitor 12/28/2016 to 01/10/2017: Normal sinus rhythm No arrhythmia seen   Labs/Other Tests and Data Reviewed:    EKG: Last EKG from 11/16/2017 personally reviewed and demonstrates: Normal sinus rhythm, rate 73 bpm, with no acute ST/T changes. Normal axis. Normal PR and QRS interval. QTc 480 ms.  Recent Labs: No results found for requested labs within last 8760 hours.   Recent Lipid Panel Lab Results  Component Value Date/Time   CHOL 250 (H) 09/12/2019 03:37 PM   TRIG 119 09/12/2019 03:37 PM   HDL 95 09/12/2019 03:37 PM   CHOLHDL 2.6 09/12/2019 03:37 PM   CHOLHDL 2.5 06/14/2016 04:51 PM   LDLCALC 135 (H) 09/12/2019 03:37 PM    Wt Readings from Last 3 Encounters:  02/18/20 135 lb (61.2 kg)  09/09/19 130 lb (59 kg)  04/01/19 128 lb (58.1 kg)     Objective:    Vital Signs:  There were no vitals taken for this visit.   VS Reviewed. General: No acute distress. Pulm: No labored breathing. No coughing during visit. No audible wheezing. Speaking in full sentences. Neuro: Alert and oriented. No slurred speech. Answers questions appropriately. Psych: Pleasant affect.  ASSESSMENT & PLAN:    1. ***  COVID-19 Education: The signs and symptoms of COVID-19 were discussed with the patient and how to seek care for testing (follow up with PCP or arrange  E-visit).  The importance of social distancing was discussed today.  Time:   Today, I have spent *** minutes with the patient with telehealth technology discussing the above problems.     Medication Adjustments/Labs and Tests Ordered: Current medicines are reviewed at length with the patient today.  Concerns regarding medicines are outlined above.   Follow Up:  {F/U Format:[phone]} {follow up:15908}  Signed, Corrin Parker, PA-C  10/31/2020 12:11 PM    [facility]

## 2020-11-02 ENCOUNTER — Ambulatory Visit: Payer: PRIVATE HEALTH INSURANCE | Admitting: Student

## 2020-11-02 ENCOUNTER — Other Ambulatory Visit: Payer: Self-pay

## 2020-11-02 ENCOUNTER — Encounter: Payer: Self-pay | Admitting: Student

## 2020-11-02 VITALS — BP 153/87 | HR 76 | Ht 67.0 in | Wt 133.0 lb

## 2020-11-02 DIAGNOSIS — R011 Cardiac murmur, unspecified: Secondary | ICD-10-CM | POA: Diagnosis not present

## 2020-11-02 DIAGNOSIS — E785 Hyperlipidemia, unspecified: Secondary | ICD-10-CM | POA: Diagnosis not present

## 2020-11-02 DIAGNOSIS — I10 Essential (primary) hypertension: Secondary | ICD-10-CM

## 2020-11-02 DIAGNOSIS — Z87898 Personal history of other specified conditions: Secondary | ICD-10-CM | POA: Diagnosis not present

## 2020-11-02 NOTE — Progress Notes (Signed)
Cardiology Office Note:    Date:  11/02/2020   ID:  Rita Berry, DOB [birth year], MRN [id number]  PCP:  Porfirio Oar, PA  Cardiologist:  Peter Swaziland, MD  Electrophysiologist:  None   Referring MD: Porfirio Oar, PA   Chief Complaint: follow-up of hypertension  History of Present Illness:    Rita Berry is a 64 y.o. female with a history of palpitations but no arrhythmias noted on monitor in 2018, hypertension, hyperlipidemia who presents today for routine follow-up.  Patient was initially seen in 11/2016 for further evaluation of dizziness in the setting of low BP. Patient had tolerated chlorthalidone for hypertension for years but developed side effects and low BP when Nitro patch was added to increase perfusion prior to hip surgery. Therefore, Chlorthalidone was discontinued and she was started on Clonidine as needed for BP spikes. Patient was no longer on Nitro patch at time of this visit so decision made to restart Chlorthalidone. BP medications have been continued to be adjusted over the years. She now takes Chlorthalidone 12.5mg  at 5am and then take Losartan 25mg  three times daily at 10am, 2pm, and 6-7pm. Patient was last seen for a virtual visit in 08/2019 at which time she stated that her BP was well controlled on this regimen and steady taking small dose of medicine several hours of apart. This has helped her BP spikes in the late afternoon. She also has hyperlipidemia but had not wanted to start a statin in the past. However, she was agreeable to starting Crestor 10mg  daily last year when cholesterol remained elevated.  Patient presents today for follow-up. BP mildly elevated in the office but patient states this is unusual for her. She monitors BP closely at home.Sysotlic BP usually in the 110's to max 138. Diastolic BP in the 60's to 80's. She is doing well from a cardiac standpoint. She stays active. No chest pain or shortness of breath. No recurrent palpitations. No  lightheadedness, dizziness, orthopnea, PND, or lower extremity edema.    Past Medical History:  Diagnosis Date  . Arthritis    hip right   . CIN I (cervical intraepithelial neoplasia I)   . Complication of anesthesia   . Essential hypertension   . Humerus distal fracture    left-08-16-16 surgery to repair  . Ovarian cyst   . PONV (postoperative nausea and vomiting)   . Pre-syncope    a. ? orthostasis/dehydration 03/2017.  05-07-1986 Vertigo    "Positional vertigo"- head elevation best    Past Surgical History:  Procedure Laterality Date  . ABDOMINAL HYSTERECTOMY    . ABDOMINAL SURGERY     Ovarian cyst  . COLPOSCOPY    . GYNECOLOGIC CRYOSURGERY    . OOPHORECTOMY     BSO  . ORIF HUMERUS FRACTURE Left 08/16/2016   Procedure: OPEN TREATMENT OF LEFT DISTAL HUMERUS FRACTURE;  Surgeon: Marland Kitchen, MD;  Location: [facility];  Service: Orthopedics;  Laterality: Left;  GENERAL ANESTHESIA WITH PRE-OP BLOCK  . TOTAL HIP ARTHROPLASTY Right 10/18/2016   Procedure: RIGHT TOTAL HIP ARTHROPLASTY ANTERIOR APPROACH;  Surgeon: Mack Hook, MD;  Location: [facility];  Service: Orthopedics;  Laterality: Right;  . TUBAL LIGATION      Current Medications: Current Meds  Medication Sig  . AMBULATORY NON FORMULARY MEDICATION Baclofen 2% Cream Apply 2g topically two times daily. This is to be compounded with Diclofenac 5%, Gabapentin 6%, Ketamine 10%, Tetracaine 3%.  . b complex vitamins tablet Take 1 tablet by mouth daily.  10/20/2016  Calcium-Magnesium 500-250 MG TABS Take 1 tablet by mouth 2 (two) times daily.  . chlorthalidone (HYGROTON) 25 MG tablet TAKE 1/2 TABLET(12.5 MG) BY MOUTH DAILY  . Cholecalciferol (VITAMIN D) 2000 units CAPS Take 2,000 Units by mouth daily.  . fluticasone (FLONASE) 50 MCG/ACT nasal spray Place 2 sprays into both nostrils daily.  Marland Kitchen Ketamine HCl POWD APPLY 2 GRAMS TWICE A DAY.  Marland Kitchen losartan (COZAAR) 25 MG tablet TAKE 1 TABLET(25 MG) BY MOUTH THREE TIMES DAILY  . Multiple  Vitamin (MULTIVITAMIN) tablet Take 1 tablet by mouth daily. Reported on 06/14/2016  . rosuvastatin (CRESTOR) 10 MG tablet TAKE 1 TABLET BY MOUTH ON MONDAY AND FRIDAY ONLY  . [DISCONTINUED] cetirizine (ZYRTEC) 10 MG tablet Take 1 tablet (10 mg total) by mouth daily.  . [DISCONTINUED] Plant Sterols and Stanols (CHOLESTOFF PO) Take by mouth daily.     Allergies:   Codeine and Latex   Social History   Socioeconomic History  . Marital status: Married    Spouse name: Maricela Curet  . Number of children: 0  . Years of education: EdD  . Highest education level: Not on file  Occupational History  . Occupation: Therapist, art    Comment: IT sales professional at [employer]  . Smoking status: Former Smoker    Quit date: 10/10/1990    Years since quitting: 30.0  . Smokeless tobacco: Never Used  Vaping Use  . Vaping Use: Former  Substance and Sexual Activity  . Alcohol use: Yes    Alcohol/week: 9.0 standard drinks    Types: 5 Glasses of wine, 4 Standard drinks or equivalent per week    Comment: social  . Drug use: No  . Sexual activity: Not Currently    Partners: Male  Other Topics Concern  . Not on file  Social History Narrative   Completed Doctor of Education in Kinesiology from [employer] 04/2013.   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   Lives with her husband.   Social Determinants of Health   Financial Resource Strain:   . Difficulty of Paying Living Expenses: Not on file  Food Insecurity:   . Worried About Programme researcher, broadcasting/film/video in the Last Year: Not on file  . Ran Out of Food in the Last Year: Not on file  Transportation Needs:   . Lack of Transportation (Medical): Not on file  . Lack of Transportation (Non-Medical): Not on file  Physical Activity:   . Days of Exercise per Week: Not on file  . Minutes of Exercise per Session: Not on file  Stress:   .  Feeling of Stress : Not on file  Social Connections:   . Frequency of Communication with Friends and Family: Not on file  . Frequency of Social Gatherings with Friends and Family: Not on file  . Attends Religious Services: Not on file  . Active Member of Clubs or Organizations: Not on file  . Attends Banker Meetings: Not on file  . Marital Status: Not on file     Family History: The patient's family history includes Bradycardia in her mother; Dementia in her mother; Diabetes in her maternal grandmother and sister; Hyperlipidemia in her father; Hypertension in her father and mother; Stroke in her maternal grandfather and maternal grandmother.  ROS:   Please see the history of present illness.     EKGs/Labs/Other Studies Reviewed:  The following studies were reviewed today:  Event Monitor 12/28/2016 to 01/10/2017: Normal sinus rhythm No arrhythmia seen  EKG:  EKG ordered today. EKG personally reviewed and demonstrates normal sinus rhythm, rate 76 bpm, with some artifact in inferior leads but Q waves and T wave inversion noted in lead III as well as mild T wave abnormality (biphasic) in lead V3. T wave changes new compared to prior tracings in 2018. QTc 456 ms.  Recent Labs: No results found for requested labs within last 8760 hours.  Recent Lipid Panel    Component Value Date/Time   CHOL 250 (H) 09/12/2019 1537   TRIG 119 09/12/2019 1537   HDL 95 09/12/2019 1537   CHOLHDL 2.6 09/12/2019 1537   CHOLHDL 2.5 06/14/2016 1651   VLDL 17 06/14/2016 1651   LDLCALC 135 (H) 09/12/2019 1537    Physical Exam:    Vital Signs: BP (!) 153/87   Pulse 76   Ht 5\' 7"  (1.702 m)   Wt 133 lb (60.3 kg)   SpO2 100%   BMI 20.83 kg/m     Wt Readings from Last 3 Encounters:  11/02/20 133 lb (60.3 kg)  02/18/20 135 lb (61.2 kg)  09/09/19 130 lb (59 kg)     General: 64 y.o. female in no acute distress. HEENT: Normocephalic and atraumatic. Sclera clear.  Neck: Supple. No  carotid bruits. No JVD. Heart: RRR. Distinct S1 and S2. Soft II/VI systolic  Murmur. No gallops or rubs. Radial and posterior tibial pulses 2+ and equal bilaterally. Lungs: No increased work of breathing. Clear to ausculation bilaterally. No wheezes, rhonchi, or rales.  Abdomen: Soft, non-distended, and non-tender to palpation.  MSK: Normal strength and tone for age.  Extremities: No lower extremity edema.    Skin: Warm and dry. Neuro: Alert and oriented x3. No focal deficits. Psych: Normal affect. Responds appropriately.  Assessment:    1. Primary hypertension   2. Hyperlipidemia, unspecified hyperlipidemia type   3. Murmur   4. History of palpitations     Plan:    Hypertension - BP mildly elevated in the office today at 153/87. I checked BP manually and confirmed this. - Continue current medications: Chlorthalidone 12.5mg  daily and Losartan 25mg  three times daily at 10am, 2pm, and 6-7pm. This regimen has worked well and helped with BP spikes in the afternoon. - Will have patient come back for CMET at time of lipid panel. - Patient states insurance is now refusing to cover the Losartan. Will reach out to Pharmacy for assistance.  Hyperlipidemia - Lipid panel in 08/2019: Total Cholesterol 250, Triglycerides 119, HDL 95, LDL 135. - Currently on Crestor 10mg  on Fridays. She is able to tolerate this dose OK but has had trouble with more frequent dosing. Although she states, she tolerates this better than the Simvastatin.  - Will have patient come to back office for fasting lipid panel and CMET.  Murmur - Patient has a soft systolic murmur on exam. - Patient has never had a Echo so will order one today.  History of Palpitations - Monitor in 2018 showed no arrhythmias.  - No recurrence.   Of note, EKG today showed isolated Q waves and T wave inversion in lead III as well as mild T wave abnormality in V3. New compared to prior tracings in 2018. Given no ischemic symptoms, do not think  there is need for any additional testing at this time.  Disposition: Follow up in 1 year.   Medication Adjustments/Labs and Tests Ordered: Current  medicines are reviewed at length with the patient today.  Concerns regarding medicines are outlined above.  Orders Placed This Encounter  Procedures  . Comprehensive Metabolic Panel (CMET)  . Lipid panel  . EKG 12-Lead  . ECHOCARDIOGRAM COMPLETE   No orders of the defined types were placed in this encounter.   Patient Instructions  Medication Instructions:  Continue current medications  *If you need a refill on your cardiac medications before your next appointment, please call your pharmacy*   Lab Work: Fasting Lipid and CMP in 2 weeks  If you have labs (blood work) drawn today and your tests are completely normal, you will receive your results only by: Marland Kitchen MyChart Message (if you have MyChart) OR . A paper copy in the mail If you have any lab test that is abnormal or we need to change your treatment, we will call you to review the results.   Testing/Procedures: Your physician has requested that you have an echocardiogram. Echocardiography is a painless test that uses sound waves to create images of your heart. It provides your doctor with information about the size and shape of your heart and how well your heart's chambers and valves are working. This procedure takes approximately one hour. There are no restrictions for this procedure.  Follow-Up: At [facility], you and your health needs are our priority.  As part of our continuing mission to provide you with exceptional heart care, we have created designated Provider Care Teams.  These Care Teams include your primary Cardiologist (physician) and Advanced Practice Providers (APPs -  Physician Assistants and Nurse Practitioners) who all work together to provide you with the care you need, when you need it.  We recommend signing up for the patient portal called "MyChart".  Sign up  information is provided on this After Visit Summary.  MyChart is used to connect with patients for Virtual Visits (Telemedicine).  Patients are able to view lab/test results, encounter notes, upcoming appointments, etc.  Non-urgent messages can be sent to your provider as well.   To learn more about what you can do with MyChart, go to [email or web address].    Your next appointment:   1 year(s)  The format for your next appointment:   In Person  Provider:   You will see one of the following Advanced Practice Providers on your designated Care Team:    Theodore Demark          Signed, Corrin Parker, PA-C  11/02/2020 4:01 PM    [facility]

## 2020-11-02 NOTE — Patient Instructions (Signed)
Medication Instructions:  Continue current medications  *If you need a refill on your cardiac medications before your next appointment, please call your pharmacy*   Lab Work: Fasting Lipid and CMP in 2 weeks  If you have labs (blood work) drawn today and your tests are completely normal, you will receive your results only by: Marland Kitchen MyChart Message (if you have MyChart) OR . A paper copy in the mail If you have any lab test that is abnormal or we need to change your treatment, we will call you to review the results.   Testing/Procedures: Your physician has requested that you have an echocardiogram. Echocardiography is a painless test that uses sound waves to create images of your heart. It provides your doctor with information about the size and shape of your heart and how well your heart's chambers and valves are working. This procedure takes approximately one hour. There are no restrictions for this procedure.  Follow-Up: At [facility], you and your health needs are our priority.  As part of our continuing mission to provide you with exceptional heart care, we have created designated Provider Care Teams.  These Care Teams include your primary Cardiologist (physician) and Advanced Practice Providers (APPs -  Physician Assistants and Nurse Practitioners) who all work together to provide you with the care you need, when you need it.  We recommend signing up for the patient portal called "MyChart".  Sign up information is provided on this After Visit Summary.  MyChart is used to connect with patients for Virtual Visits (Telemedicine).  Patients are able to view lab/test results, encounter notes, upcoming appointments, etc.  Non-urgent messages can be sent to your provider as well.   To learn more about what you can do with MyChart, go to [email or web address].    Your next appointment:   1 year(s)  The format for your next appointment:   In Person  Provider:   You will see one of the  following Advanced Practice Providers on your designated Care Team:    Theodore Demark

## 2020-11-04 ENCOUNTER — Telehealth: Payer: BC Managed Care – PPO | Admitting: Student

## 2020-11-05 ENCOUNTER — Telehealth: Payer: Self-pay | Admitting: Student

## 2020-11-05 DIAGNOSIS — I10 Essential (primary) hypertension: Secondary | ICD-10-CM

## 2020-11-05 MED ORDER — VALSARTAN 160 MG PO TABS: 160.0000 mg | ORAL_TABLET | Freq: Every day | ORAL | 2 refills | Status: DC

## 2020-11-05 NOTE — Telephone Encounter (Signed)
   I saw patient in clinic on Monday 11/02/2020 at which time patient mentioned that insurance was not longer going to cover her Losartan. I spoke with Phillips Hay, PharmD, who stated this is because she has been taking it 3 times per day to help with BP spikes later in the day. One option Kristin recommended was stopping Losartan and switching to Olmesartan 20mg  or Valsartan 160mg  once daily given these are stronger ARB that last longer in the blood stream. I called and discussed this with patient and she is agreeable to trying this. Will try Valsartan 160mg  daily. Patient will continue to monitor BP closely at home and let know how she does with this. She will come by in 1-2 weeks for a BMET.  , PA-C 11/05/2020 9:36 AM

## 2020-11-10 ENCOUNTER — Telehealth: Payer: Self-pay | Admitting: Student

## 2020-11-10 NOTE — Telephone Encounter (Signed)
Completely agree Dorathy Daft. Thanks so much!

## 2020-11-10 NOTE — Telephone Encounter (Signed)
Patient returning call.

## 2020-11-10 NOTE — Telephone Encounter (Signed)
Pt c/o medication issue:  1. Name of Medication: valsartan (DIOVAN) 160 MG tablet  2. How are you currently taking this medication (dosage and times per day)? 1 tablet daily  3. Are you having a reaction (difficulty breathing--STAT)? no  4. What is your medication issue? Patient states her BP medications were changed and she was switched. She states she started taking the valsartan 160 mg on Saturaday. She states she would like another 40 mg tablet, because her BP is still high at 148/92.

## 2020-11-10 NOTE — Telephone Encounter (Signed)
Left voicemail for patient to call back. 

## 2020-11-10 NOTE — Telephone Encounter (Signed)
Spoke with patient. Patient reports her blood pressure is still trending higher since starting the Valsartan.   She takes Chlorthalidone in the AM, and Valsartan later in the day. She'd like to add a 40mg  tablet to take early morning if possible so she can start taking the 160mg  of Valsartan mid morning or later.   Her blood pressure yesterday was 148/92 1 hour after taking her Valsartan. At 6p her blood pressure was 118/68.   Patient started the new medication regimen on Saturday. Instructed patient to monitor blood pressure for at least 2 weeks since starting Valsartan and to send in logs for review and medication titration. Patient instructed to call if she is hypertensive with any symptoms. Will route to [place], Monday and pharm D for review.

## 2020-11-11 NOTE — Telephone Encounter (Signed)
Rita Berry, can we update her med list with these changes. We are going to stop her Valsartan and restart her Losartan (25mg  three times daily). I think she needs a refill of her Losartan.  Thank you so much! Korri Ask

## 2020-11-17 ENCOUNTER — Telehealth: Payer: Self-pay

## 2020-11-17 MED ORDER — LOSARTAN POTASSIUM 25 MG PO TABS: 25.0000 mg | ORAL_TABLET | Freq: Three times a day (TID) | ORAL | 3 refills | Status: DC

## 2020-11-17 NOTE — Telephone Encounter (Signed)
Rita Berry, can we update her med list with these changes. We are going to stop her Valsartan and restart her Losartan (25mg  three times daily). I think she needs a refill of her Losartan.  Thank you so much! Callie  Medication changed and refilled

## 2020-11-20 NOTE — Telephone Encounter (Addendum)
**Note De-Identified [place] Fil[place] Obfuscation** Romaine Shankland Key: [id number] - PA Case ID: [id number] Outcome: Approved on November 15 Your request has been approved Drug: Losartan Potassium 25MG  tablets Form: Rx (MRx) [employer] Prior Authorization Form 2017  I have notified [facility] pharmacy of this approval.

## 2020-11-25 ENCOUNTER — Other Ambulatory Visit (HOSPITAL_COMMUNITY): Payer: BC Managed Care – PPO

## 2020-12-15 ENCOUNTER — Ambulatory Visit (HOSPITAL_COMMUNITY): Payer: PRIVATE HEALTH INSURANCE | Attending: Cardiology

## 2020-12-15 ENCOUNTER — Other Ambulatory Visit: Payer: Self-pay

## 2020-12-15 DIAGNOSIS — R011 Cardiac murmur, unspecified: Secondary | ICD-10-CM | POA: Insufficient documentation

## 2020-12-15 LAB — ECHOCARDIOGRAM COMPLETE
Area-P 1/2: 3.5 cm2
S' Lateral: 2 cm

## 2020-12-30 ENCOUNTER — Other Ambulatory Visit: Payer: Self-pay | Admitting: Physician Assistant

## 2021-01-13 ENCOUNTER — Other Ambulatory Visit: Payer: Self-pay | Admitting: Physician Assistant

## 2021-03-22 MED ORDER — CHLORTHALIDONE 25 MG PO TABS: ORAL_TABLET | ORAL | 12 refills | Status: DC

## 2021-03-26 MED ORDER — LOSARTAN POTASSIUM 25 MG PO TABS: 25.0000 mg | ORAL_TABLET | Freq: Three times a day (TID) | ORAL | 3 refills | Status: DC

## 2021-03-26 MED ORDER — CHLORTHALIDONE 25 MG PO TABS: ORAL_TABLET | ORAL | 3 refills | Status: DC

## 2021-03-26 NOTE — Addendum Note (Signed)
Addended by: Alyson Ingles on: 03/26/2021 11:58 AM   Modules accepted: Orders

## 2021-07-05 ENCOUNTER — Other Ambulatory Visit: Payer: Self-pay

## 2021-07-05 MED ORDER — LOSARTAN POTASSIUM 25 MG PO TABS
25.0000 mg | ORAL_TABLET | Freq: Three times a day (TID) | ORAL | 3 refills | Status: DC
Start: 2021-07-05 — End: 2021-07-06

## 2021-07-06 MED ORDER — LOSARTAN POTASSIUM 25 MG PO TABS: 25.0000 mg | ORAL_TABLET | Freq: Three times a day (TID) | ORAL | 1 refills | Status: DC

## 2021-11-15 NOTE — Progress Notes (Deleted)
Cardiology Office Note:    Date:  11/15/2021   ID:  [name], DOB 07-13-1956, MRN [id number]  PCP:  Porfirio Oar, PA  Cardiologist:  Peter Swaziland, MD  Electrophysiologist:  None   Referring MD: Porfirio Oar, PA   Chief Complaint: follow-up of hypertension  History of Present Illness:    Rita Berry is a 65 y.o. female with a history of palpitations but no arrhythmias noted on monitor in 2018, hypertension, hyperlipidemia who presents today for routine follow-up.  Patient was initially seen in 11/2016 for further evaluation of dizziness in the setting of low BP. Patient had tolerated Chlorthalidone for hypertension for years but developed side effects and low BP when Nitro patch was added to increase perfusion prior to hip surgery. Therefore, Chlorthalidone was discontinued and she was started on Clonidine as needed for BP spikes. Patient was no longer on Nitro patch at time of this visit so decision made to restart Chlorthalidone. BP medications have been continued to be adjusted over the years. She now takes Chlorthalidone 12.5mg  at 5am and then take Losartan 25mg  three times daily at 10am, 2pm, and 6-7pm. This regimen has worked well and helped with BP spikes in the afternoon.   I last saw the patient in 10/2020 for routine follow-up at which time she was doing well from cardiac standpoint with no complaints. An Echo was ordered for further evaluation of a murmur heard on exam and showed LVEF of 65-70% with normal wall motion and grade 1 diastolic dysfunction. No significant valvular disease was noted but accelerated doppler signal through the outflow tract was seen which was felt to likely be the source of the murmur.   Patient presents today for follow-up. ***  Hypertension BP *** - Continue current medications: Chlorthalidone 12.5mg  daily and Losartan 25mg  three times daily at 10am, 2pm, and 6-7pm. This regimen has worked well and helped with BP spikes in the afternoon. -  Will repeat CMET today.   Hyperlipidemia Last lipid panel in 08/2019: Total Cholesterol 250, Triglycerides 119, HDL 95, LDL 135. - Currently on Crestor 10mg  on Fridays. She is able to tolerate this dose OK but has had trouble with more frequent dosing. Although she states, she tolerates this better than the Simvastatin.  - Will check fasting lipid panel and CMET today. Will likely add Zetia. ***   History of Palpitations History of palpitations. Monitor in 2018 showed no arrhythmias.  - No recurrence. ***  Past Medical History:  Diagnosis Date   Arthritis    hip right    CIN I (cervical intraepithelial neoplasia I)    Complication of anesthesia    Essential hypertension    Humerus distal fracture    left-08-16-16 surgery to repair   Ovarian cyst    PONV (postoperative nausea and vomiting)    Pre-syncope    a. ? orthostasis/dehydration 03/2017.   Vertigo    "Positional vertigo"- head elevation best    Past Surgical History:  Procedure Laterality Date   ABDOMINAL HYSTERECTOMY     ABDOMINAL SURGERY     Ovarian cyst   COLPOSCOPY     GYNECOLOGIC CRYOSURGERY     OOPHORECTOMY     BSO   ORIF HUMERUS FRACTURE Left 08/16/2016   Procedure: OPEN TREATMENT OF LEFT DISTAL HUMERUS FRACTURE;  Surgeon: 05-07-1986, MD;  Location: [facility];  Service: Orthopedics;  Laterality: Left;  GENERAL ANESTHESIA WITH PRE-OP BLOCK   TOTAL HIP ARTHROPLASTY Right 10/18/2016   Procedure: RIGHT TOTAL  HIP ARTHROPLASTY ANTERIOR APPROACH;  Surgeon: Durene Romans, MD;  Location: [facility];  Service: Orthopedics;  Laterality: Right;   TUBAL LIGATION      Current Medications: No outpatient medications have been marked as taking for the 11/25/21 encounter (Appointment) with Corrin Parker, PA-C.     Allergies:   Codeine and Latex   Social History   Socioeconomic History   Marital status: Married    Spouse name: Maricela Curet   Number of children: 0   Years of education: EdD   Highest  education level: Not on file  Occupational History   Occupation: Therapist, art    Comment: Residents and Haematologist at [employer]  Tobacco Use   Smoking status: Former    Types: Cigarettes    Quit date: 10/10/1990    Years since quitting: 31.1   Smokeless tobacco: Never  Vaping Use   Vaping Use: Former  Substance and Sexual Activity   Alcohol use: Yes    Alcohol/week: 9.0 standard drinks    Types: 5 Glasses of wine, 4 Standard drinks or equivalent per week    Comment: social   Drug use: No   Sexual activity: Not Currently    Partners: Male  Other Topics Concern   Not on file  Social History Narrative   Completed Doctor of Education in Hotel manager from [employer] 04/2013.   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   Lives with her husband.   Social Determinants of Health   Financial Resource Strain: Not on file  Food Insecurity: Not on file  Transportation Needs: Not on file  Physical Activity: Not on file  Stress: Not on file  Social Connections: Not on file     Family History: The patient's ***family history includes Bradycardia in her mother; Dementia in her mother; Diabetes in her maternal grandmother and sister; Hyperlipidemia in her father; Hypertension in her father and mother; Stroke in her maternal grandfather and maternal grandmother.  ROS:   Please see the history of present illness.    *** All other systems reviewed and are negative.  EKGs/Labs/Other Studies Reviewed:    The following studies were reviewed today:  Event Monitor 12/28/2016 to 01/10/2017: Normal sinus rhythm No arrhythmia seen _______________  Echocardiogram 12/15/2020: Impressions:  1. Accelerated Doppler signal through outflow tract (likely source of  murmur). No evidence of outflow tract obstruction. Left ventricular  ejection fraction, by estimation, is 65 to 70%. The left  ventricle has  normal function. The left ventricle has no  regional wall motion abnormalities. Left ventricular diastolic parameters  are consistent with Grade I diastolic dysfunction (impaired relaxation).   2. Right ventricular systolic function is normal. The right ventricular  size is normal.   3. The mitral valve is normal in structure. No evidence of mitral valve  regurgitation. No evidence of mitral stenosis.   4. The aortic valve is normal in structure. Aortic valve regurgitation is  not visualized. No aortic stenosis is present.   5. The inferior vena cava is normal in size with greater than 50%  respiratory variability, suggesting right atrial pressure of 3 mmHg.  EKG:  EKG ordered today. EKG personally reviewed and demonstrates ***.  Recent Labs: No results found for requested labs within last 8760 hours.  Recent Lipid Panel    Component Value Date/Time   CHOL 250 (H) 09/12/2019 1537   TRIG 119 09/12/2019 1537  HDL 95 09/12/2019 1537   CHOLHDL 2.6 09/12/2019 1537   CHOLHDL 2.5 06/14/2016 1651   VLDL 17 06/14/2016 1651   LDLCALC 135 (H) 09/12/2019 1537    Physical Exam:    Vital Signs: There were no vitals taken for this visit.    Wt Readings from Last 3 Encounters:  11/02/20 133 lb (60.3 kg)  02/18/20 135 lb (61.2 kg)  09/09/19 130 lb (59 kg)     General: 65 y.o. female in no acute distress. HEENT: Normocephalic and atraumatic. Sclera clear. EOMs intact. Neck: Supple. No carotid bruits. No JVD. Heart: *** RRR. Distinct S1 and S2. No murmurs, gallops, or rubs. Radial and distal pedal pulses 2+ and equal bilaterally. Lungs: No increased work of breathing. Clear to ausculation bilaterally. No wheezes, rhonchi, or rales.  Abdomen: Soft, non-distended, and non-tender to palpation. Bowel sounds present in all 4 quadrants.  MSK: Normal strength and tone for age. *** Extremities: No lower extremity edema.    Skin: Warm and dry. Neuro: Alert and oriented x3. No  focal deficits. Psych: Normal affect. Responds appropriately.   Assessment:    No diagnosis found.  Plan:     Disposition: Follow up in ***   Medication Adjustments/Labs and Tests Ordered: Current medicines are reviewed at length with the patient today.  Concerns regarding medicines are outlined above.  No orders of the defined types were placed in this encounter.  No orders of the defined types were placed in this encounter.   There are no Patient Instructions on file for this visit.   Signed, Corrin Parker, PA-C  11/15/2021 12:48 PM    [facility]

## 2021-11-25 ENCOUNTER — Ambulatory Visit: Payer: BC Managed Care – PPO | Admitting: Student

## 2021-11-25 NOTE — Progress Notes (Signed)
Cardiology Office Note:    Date:  11/30/2021   ID:  Rita Berry, DOB 03-31-1956, MRN [id number]  PCP:  Porfirio Oar, PA  Cardiologist:  Peter Swazil[place], MD  Electrophysiologist:  None   Referring MD: Porfirio Oar, PA   Chief Complaint: follow-up of hypertension  History of Present Illness:    Rita Berry is a 65 y.o. female with a history of palpitations but no arrhythmias noted on monitor in 2018, hypertension, hyperlipidemia who presents today for routine follow-up.  Patient was initially seen in 11/2016 for further evaluation of dizziness in the setting of low BP. Patient had tolerated Chlorthalidone for hypertension for years but developed side effects and low BP when Nitro patch was added to increase perfusion prior to hip surgery. Therefore, Chlorthalidone was discontinued and she was started on Clonidine as needed for BP spikes. Patient was no longer on Nitro patch at time of this visit so decision made to restart Chlorthalidone. BP medications have been continued to be adjusted over the years. She now takes Chlorthalidone 12.5mg  at 5am and then take Losartan 25mg  three times daily at 10am, 2pm, and 6-7pm. This regimen has worked well and helped with BP spikes in the afternoon.   I last saw the patient in 10/2020 for routine follow-up at which time she was doing well from cardiac standpoint with no complaints. An Echo was ordered for further evaluation of a murmur heard on exam and showed LVEF of 65-70% with normal wall motion and grade 1 diastolic dysfunction. No significant valvular disease was noted but accelerated doppler signal through the outflow tract was seen which was felt to likely be the source of the murmur.   Patient presents today for follow-up. Here alone. Patient has done well since last visit. She states her BP has been doing well at home and is almost always at goal of <130/80. She readjusted how she is taking her medications and states she has been taking her  Chlorthalidone and the first dose of the Losartan at the same time around 7:30am. Since doing this, she has not noticed as significant spikes in her BP in the afternoon and states she often dose not have to take the 3rd dose of Losartan (and sometimes does not even need to take the 2nd dose). She has had some change in her job and is not as stressed as she was before which she thinks as helped. She denies any chest pain, shortness of breath, orthopnea, PND, lower extremity. She notes some palpitations if she drinks more caffeine (especially espresso) but no significant palpitations outside these times. No lightheadedness, dizziness, or syncope.   Past Medical History:  Diagnosis Date   Arthritis    hip right    CIN I (cervical intraepithelial neoplasia I)    Complication of anesthesia    Essential hypertension    Humerus distal fracture    left-08-16-16 surgery to repair   Hyperlipidemia    Ovarian cyst    Palpitations    no arrhythmias noted on monitor 2018   PONV (postoperative nausea and vomiting)    Pre-syncope    a. ? orthostasis/dehydration 03/2017.   Vertigo    "Positional vertigo"- head elevation best    Past Surgical History:  Procedure Laterality Date   ABDOMINAL HYSTERECTOMY     ABDOMINAL SURGERY     Ovarian cyst   COLPOSCOPY     GYNECOLOGIC CRYOSURGERY     OOPHORECTOMY     BSO   ORIF HUMERUS FRACTURE Left  08/16/2016   Procedure: OPEN TREATMENT OF LEFT DISTAL HUMERUS FRACTURE;  Surgeon: Mack Hook, MD;  Location: [facility];  Service: Orthopedics;  Laterality: Left;  GENERAL ANESTHESIA WITH PRE-OP BLOCK   TOTAL HIP ARTHROPLASTY Right 10/18/2016   Procedure: RIGHT TOTAL HIP ARTHROPLASTY ANTERIOR APPROACH;  Surgeon: Durene Romans, MD;  Location: [facility];  Service: Orthopedics;  Laterality: Right;   TUBAL LIGATION      Current Medications: Current Meds  Medication Sig   AMBULATORY NON FORMULARY MEDICATION Baclofen 2% Cream Apply 2g topically two times  daily. This is to be compounded with Diclofenac 5%, Gabapentin 6%, Ketamine 10%, Tetracaine 3%.   b complex vitamins tablet Take 1 tablet by mouth daily.   Calcium-Magnesium 500-250 MG TABS Take 1 tablet by mouth 2 (two) times daily.   chlorthalidone (HYGROTON) 25 MG tablet TAKE 1/2 TABLET(12.5 MG) BY MOUTH DAILY   Cholecalciferol (VITAMIN D) 2000 units CAPS Take 2,000 Units by mouth daily.   fluticasone (FLONASE) 50 MCG/ACT nasal spray Place 2 sprays into both nostrils daily.   Ketamine HCl POWD APPLY 2 GRAMS TWICE A DAY.   losartan (COZAAR) 25 MG tablet Take 1 tablet (25 mg total) by mouth 3 (three) times daily.   Multiple Vitamin (MULTIVITAMIN) tablet Take 1 tablet by mouth daily. Reported on 06/14/2016   rosuvastatin (CRESTOR) 10 MG tablet TAKE 1 TABLET BY MOUTH MONDAY AND FRIDAY ONLY     Allergies:   Codeine and Latex   Social History   Socioeconomic History   Marital status: Married    Spouse name: Rita Berry   Number of children: 0   Years of education: EdD   Highest education level: Not on file  Occupational History   Occupation: Therapist, art    Comment: Residents and Haematologist at [employer]  Tobacco Use   Smoking status: Former    Types: Cigarettes    Quit date: 10/10/1990    Years since quitting: 31.1   Smokeless tobacco: Never  Vaping Use   Vaping Use: Former  Substance and Sexual Activity   Alcohol use: Yes    Alcohol/week: 9.0 standard drinks    Types: 5 Glasses of wine, 4 Standard drinks or equivalent per week    Comment: social   Drug use: No   Sexual activity: Not Currently    Partners: Male  Other Topics Concern   Not on file  Social History Narrative   Completed Doctor of Education in Hotel manager from [employer] 04/2013.   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   Lives with her husband.   Social Determinants of Health   Financial Resource  Strain: Not on file  Food Insecurity: Not on file  Transportation Needs: Not on file  Physical Activity: Not on file  Stress: Not on file  Social Connections: Not on file     Family History: The patient's family history includes Bradycardia in her mother; Dementia in her mother; Diabetes in her maternal grandmother and sister; Hyperlipidemia in her father; Hypertension in her father and mother; Stroke in her maternal grandfather and maternal grandmother.  ROS:   Please see the history of present illness.     EKGs/Labs/Other Studies Reviewed:    The following studies were reviewed today:  Event Monitor 12/28/2016 to 01/10/2017: Normal sinus rhythm No arrhythmia seen _______________  Echocardiogram 12/15/2020: Impressions:  1. Accelerated Doppler signal through outflow tract (likely source  of  murmur). No evidence of outflow tract obstruction. Left ventricular  ejection fraction, by estimation, is 65 to 70%. The left ventricle has  normal function. The left ventricle has no  regional wall motion abnormalities. Left ventricular diastolic parameters  are consistent with Grade I diastolic dysfunction (impaired relaxation).   2. Right ventricular systolic function is normal. The right ventricular  size is normal.   3. The mitral valve is normal in structure. No evidence of mitral valve  regurgitation. No evidence of mitral stenosis.   4. The aortic valve is normal in structure. Aortic valve regurgitation is  not visualized. No aortic stenosis is present.   5. The inferior vena cava is normal in size with greater than 50%  respiratory variability, suggesting right atrial pressure of 3 mmHg.  EKG:  EKG ordered today. EKG personally reviewed and demonstrates normal sinus rhythm, rate 87 bpm, with low voltage QRS, Q waves in leads III and aVF, and non-specific ST/T changes. Left axis deviation. Normal PR and QRS intervals. QTc 430 ms. No significant changes compared to prior  tracing.  Recent Labs: No results found for requested labs within last 8760 hours.  Recent Lipid Panel    Component Value Date/Time   CHOL 250 (H) 09/12/2019 1537   TRIG 119 09/12/2019 1537   HDL 95 09/12/2019 1537   CHOLHDL 2.6 09/12/2019 1537   CHOLHDL 2.5 06/14/2016 1651   VLDL 17 06/14/2016 1651   LDLCALC 135 (H) 09/12/2019 1537    Physical Exam:    Vital Signs: BP 138/80   Pulse 87   Ht 5' 7.5" (1.715 m)   Wt 135 lb 9.6 oz (61.5 kg)   SpO2 98%   BMI 20.92 kg/m     Wt Readings from Last 3 Encounters:  11/30/21 135 lb 9.6 oz (61.5 kg)  11/02/20 133 lb (60.3 kg)  02/18/20 135 lb (61.2 kg)     General: 65 y.o. Caucasian female in no acute distress. HEENT: Normocephalic and atraumatic. Sclera clear.  Neck: Supple. No carotid bruits. No JVD. Heart: RRR. Distinct S1 and S2. No significant murmur, gallops, or rubs. Radial pulses 2+ and equal bilaterally. Lungs: No increased work of breathing. Clear to ausculation bilaterally. No wheezes, rhonchi, or rales.  Abdomen: Soft, non-distended, and non-tender to palpation.  MSK: Normal strength and tone for age.  Extremities: No lower extremity edema.    Skin: Warm and dry. Neuro: Alert and oriented x3. No focal deficits. Psych: Normal affect. Responds appropriately.  Assessment:    1. Primary hypertension   2. Hyperlipidemia, unspecified hyperlipidemia type   3. History of palpitations     Plan:    Hypertension BP well borderline in the office today at 138/80. However, she states BP well controlled at home and almost always at goal of <130/80.  - Current medications: Chlorthalidone 12.5mg  daily and Losartan 25mg  three times daily at 10am, 2pm, and 6-7pm. This regimen previously has worked very well and helped with BP spikes in the afternoon. Since last visit, she has readjusted how she is taking her medications and states she has been taking her Chlorthalidone and the first dose of the Losartan at the same time around  7:30am. Since doing this, she has not noticed as significant spikes in her BP in the afternoon and often dose not have to take the 3rd dose of Losartan (and sometimes does not even need to take the 2nd dose). OK to continue current regimen since this is working well.  - Will  repeat CMET when she comes back in for fasting labs.   Hyperlipidemia Last lipid panel in 08/2019: Total Cholesterol 250, Triglycerides 119, HDL 95, LDL 135. - Currently on Crestor 10mg  on Mondays and Fridays. She is able to tolerate this dose OK but has had trouble with more frequent dosing. Although she tolerates this better than the Simvastatin. Continue current dose of Crestor. - Patient is not fasting today so she will come back for lipid panel and CMET within the next month. May need to add Zetia depending on the results.    History of Palpitations History of palpitations. Monitor in 2018 showed no arrhythmias.  - She notes some palpitations with increased caffeine intake but otherwise no issues. Discussed limited caffeine if this is super bothersome to her. - No additional work-up necessary at this time.  Disposition: Follow up in 1 year.   Medication Adjustments/Labs and Tests Ordered: Current medicines are reviewed at length with the patient today.  Concerns regarding medicines are outlined above.  Orders Placed This Encounter  Procedures   Comprehensive metabolic panel   Lipid panel   EKG 12-Lead    No orders of the defined types were placed in this encounter.   Patient Instructions  Medication Instructions:  No Changes *If you need a refill on your cardiac medications before your next appointment, please call your pharmacy*   Lab Work: CMET, Lipid : 1 Month If you have labs (blood work) drawn today and your tests are completely normal, you will receive your results only by: MyChart Message (if you have MyChart) OR A paper copy in the mail If you have any lab test that is abnormal or we need to  change your treatment, we will call you to review the results.   Testing/Procedures: No Testing    Follow-Up: At [facility], you and your health needs are our priority.  As part of our continuing mission to provide you with exceptional heart care, we have created designated Provider Care Teams.  These Care Teams include your primary Cardiologist (physician) and Advanced Practice Providers (APPs -  Physician Assistants and Nurse Practitioners) who all work together to provide you with the care you need, when you need it.  We recommend signing up for the patient portal called "MyChart".  Sign up information is provided on this After Visit Summary.  MyChart is used to connect with patients for Virtual Visits (Telemedicine).  Patients are able to view lab/test results, encounter notes, upcoming appointments, etc.  Non-urgent messages can be sent to your provider as well.   To learn more about what you can do with MyChart, go to [name].    Your next appointment:   1 year(s)  The format for your next appointment:   In Person  Provider:   Peter [email or web address], MD         Signed, [place], PA-C  11/30/2021 4:49 PM    [facility]

## 2021-11-27 ENCOUNTER — Encounter: Payer: Self-pay | Admitting: Student

## 2021-11-27 DIAGNOSIS — E785 Hyperlipidemia, unspecified: Secondary | ICD-10-CM | POA: Insufficient documentation

## 2021-11-30 ENCOUNTER — Other Ambulatory Visit: Payer: Self-pay

## 2021-11-30 ENCOUNTER — Ambulatory Visit (INDEPENDENT_AMBULATORY_CARE_PROVIDER_SITE_OTHER): Payer: PRIVATE HEALTH INSURANCE | Admitting: Student

## 2021-11-30 ENCOUNTER — Encounter: Payer: Self-pay | Admitting: Student

## 2021-11-30 VITALS — BP 138/80 | HR 87 | Ht 67.5 in | Wt 135.6 lb

## 2021-11-30 DIAGNOSIS — E785 Hyperlipidemia, unspecified: Secondary | ICD-10-CM | POA: Diagnosis not present

## 2021-11-30 DIAGNOSIS — Z87898 Personal history of other specified conditions: Secondary | ICD-10-CM

## 2021-11-30 DIAGNOSIS — I10 Essential (primary) hypertension: Secondary | ICD-10-CM

## 2021-11-30 NOTE — Patient Instructions (Signed)
Medication Instructions:  No Changes *If you need a refill on your cardiac medications before your next appointment, please call your pharmacy*   Lab Work: CMET, Lipid : 1 Month If you have labs (blood work) drawn today and your tests are completely normal, you will receive your results only by: MyChart Message (if you have MyChart) OR A paper copy in the mail If you have any lab test that is abnormal or we need to change your treatment, we will call you to review the results.   Testing/Procedures: No Testing    Follow-Up: At [facility], you and your health needs are our priority.  As part of our continuing mission to provide you with exceptional heart care, we have created designated Provider Care Teams.  These Care Teams include your primary Cardiologist (physician) and Advanced Practice Providers (APPs -  Physician Assistants and Nurse Practitioners) who all work together to provide you with the care you need, when you need it.  We recommend signing up for the patient portal called "MyChart".  Sign up information is provided on this After Visit Summary.  MyChart is used to connect with patients for Virtual Visits (Telemedicine).  Patients are able to view lab/test results, encounter notes, upcoming appointments, etc.  Non-urgent messages can be sent to your provider as well.   To learn more about what you can do with MyChart, go to [email or web address].    Your next appointment:   1 year(s)  The format for your next appointment:   In Person  Provider:   Peter [place], MD

## 2021-12-21 ENCOUNTER — Other Ambulatory Visit: Payer: Self-pay

## 2021-12-21 MED ORDER — LOSARTAN POTASSIUM 25 MG PO TABS: 25.0000 mg | ORAL_TABLET | Freq: Three times a day (TID) | ORAL | 1 refills | Status: DC

## 2021-12-21 MED ORDER — CHLORTHALIDONE 25 MG PO TABS: ORAL_TABLET | ORAL | 3 refills | Status: DC

## 2021-12-24 ENCOUNTER — Other Ambulatory Visit: Payer: Self-pay

## 2021-12-24 DIAGNOSIS — I10 Essential (primary) hypertension: Secondary | ICD-10-CM

## 2021-12-24 MED ORDER — CHLORTHALIDONE 25 MG PO TABS: ORAL_TABLET | ORAL | 3 refills | Status: DC

## 2022-01-04 ENCOUNTER — Other Ambulatory Visit: Payer: Self-pay

## 2022-01-04 ENCOUNTER — Ambulatory Visit: Payer: BC Managed Care – PPO | Admitting: Nurse Practitioner

## 2022-01-04 ENCOUNTER — Encounter: Payer: Self-pay | Admitting: Nurse Practitioner

## 2022-01-04 DIAGNOSIS — K029 Dental caries, unspecified: Secondary | ICD-10-CM

## 2022-01-04 DIAGNOSIS — K219 Gastro-esophageal reflux disease without esophagitis: Secondary | ICD-10-CM

## 2022-01-04 MED ORDER — CHLORHEXIDINE GLUCONATE 0.12 % MT SOLN: 5.0000 mL | Freq: Three times a day (TID) | OROMUCOSAL | Status: DC | PRN

## 2022-01-04 NOTE — Progress Notes (Signed)
Acute Office Visit  Subjective:    Patient ID: Rita Berry, female    DOB: 30-Jul-1956, 66 y.o.   MRN: [id number]  Chief Complaint  Patient presents with   fluttering in chest     HPI Patient is in today for fluttering feeling in chest since Jan 13/14. Patient states after walking fast she feels a flutter in her chest. It does not feel like her heart, but feels like heartburn to her. Pt. States drinking cold water helps the feeling go away.  She thought it was because of too much caffeine intake, so she started using decaf coffee along with stopping her daily vitamins to see if this will help. Patient states she likes spicy food and is known to eat a whole jalapeno in the mornings.  She denies chest pain, SOB, numbness or tingling in her extremities, headaches, chills, or fever. She has not taken any medications to try and help. Previously taking Tums as needed, but has not taken any recently.   Patient states she has a left molar tooth that is due for extraction in 3-4 weeks. She has been given amoxicillin from her dentist prior to the procedure. She has started taking the amoxicillin last night and is scared that the tooth is infected and can be causing this issue. She is going to follow up with her dentist this afternoon.   Pt. Also having issues with dry mouth. She was prescribed Chlorhexidine mouthwash for her previous tooth extraction and would like the prescription renewed to see if this will help with her dry mouth.   She has a family history of cardiac events and medical history of HTN and Hyperlipidemia. She recently saw her cardiologist on 1/6 with a complete blood panel and EKG, all unremarkable.   Past Medical History:  Diagnosis Date   Arthritis    hip right    CIN I (cervical intraepithelial neoplasia I)    Complication of anesthesia    Essential hypertension    Humerus distal fracture    left-08-16-16 surgery to repair   Hyperlipidemia    Ovarian cyst    Palpitations     no arrhythmias noted on monitor 2018   PONV (postoperative nausea and vomiting)    Pre-syncope    a. ? orthostasis/dehydration 03/2017.   Vertigo    "Positional vertigo"- head elevation best    Past Surgical History:  Procedure Laterality Date   ABDOMINAL HYSTERECTOMY     ABDOMINAL SURGERY     Ovarian cyst   COLPOSCOPY     GYNECOLOGIC CRYOSURGERY     OOPHORECTOMY     BSO   ORIF HUMERUS FRACTURE Left 08/16/2016   Procedure: OPEN TREATMENT OF LEFT DISTAL HUMERUS FRACTURE;  Surgeon: Mack Hookavid Thompson, MD;  Location:  [facility];  Service: Orthopedics;  Laterality: Left;  GENERAL ANESTHESIA WITH PRE-OP BLOCK   TOTAL HIP ARTHROPLASTY Right 10/18/2016   Procedure: RIGHT TOTAL HIP ARTHROPLASTY ANTERIOR APPROACH;  Surgeon: Durene RomansMatthew Olin, MD;  Location: [facility];  Service: Orthopedics;  Laterality: Right;   TUBAL LIGATION      Family History  Problem Relation Age of Onset   Hypertension Mother    Dementia Mother    Bradycardia Mother    Hypertension Father    Hyperlipidemia Father    Diabetes Sister        gestational   Diabetes Maternal Grandmother    Stroke Maternal Grandmother    Stroke Maternal Grandfather     Social History   Socioeconomic  History   Marital status: Married    Spouse name: [name]   Number of children: 0   Years of education: EdD   Highest education level: Not on file  Occupational History   Occupation: Therapist, artWellness Director    Comment: Residents and Haematologisttaff at [employer]  Tobacco Use   Smoking status: Former    Types: Cigarettes    Quit date: 10/10/1990    Years since quitting: 31.2   Smokeless tobacco: Never  Vaping Use   Vaping Use: Former  Substance and Sexual Activity   Alcohol use: Yes    Alcohol/week: 9.0 standard drinks    Types: 5 Glasses of wine, 4 Standard drinks or equivalent per week    Comment: social   Drug use: No   Sexual activity: Not Currently    Partners: Male  Other Topics Concern   Not on file  Social History  Narrative   Completed Doctor of Education in Hotel managerKinesiology from [employer] 04/2013.   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   Lives with her husband.   Social Determinants of Health   Financial Resource Strain: Not on file  Food Insecurity: Not on file  Transportation Needs: Not on file  Physical Activity: Not on file  Stress: Not on file  Social Connections: Not on file  Intimate Partner Violence: Not on file    Outpatient Medications Prior to Visit  Medication Sig Dispense Refill   AMBULATORY NON FORMULARY MEDICATION Baclofen 2% Cream Apply 2g topically two times daily. This is to be compounded with Diclofenac 5%, Gabapentin 6%, Ketamine 10%, Tetracaine 3%. 120 g 1   b complex vitamins tablet Take 1 tablet by mouth daily.     Calcium-Magnesium 500-250 MG TABS Take 1 tablet by mouth 2 (two) times daily.     chlorthalidone (HYGROTON) 25 MG tablet TAKE 1/2 TABLET(12.5 MG) BY MOUTH DAILY 15 tablet 3   Cholecalciferol (VITAMIN D) 2000 units CAPS Take 2,000 Units by mouth daily.     fluticasone (FLONASE) 50 MCG/ACT nasal spray Place 2 sprays into both nostrils daily. 16 g 0   Ketamine HCl POWD APPLY 2 GRAMS TWICE A DAY. 120 g 3   losartan (COZAAR) 25 MG tablet Take 1 tablet (25 mg total) by mouth 3 (three) times daily. 270 tablet 1   Multiple Vitamin (MULTIVITAMIN) tablet Take 1 tablet by mouth daily. Reported on 06/14/2016     rosuvastatin (CRESTOR) 10 MG tablet TAKE 1 TABLET BY MOUTH MONDAY AND FRIDAY ONLY 25 tablet 3   No facility-administered medications prior to visit.    Allergies  Allergen Reactions   Codeine Nausea And Vomiting   Latex Itching    Review of Systems  Constitutional:  Negative for appetite change, chills, diaphoresis, fatigue and fever.  HENT:  Positive for dental problem. Negative for ear pain, facial swelling and mouth sores.   Respiratory:  Negative  for chest tightness and shortness of breath.   Cardiovascular:  Negative for chest pain and leg swelling.  Gastrointestinal:  Negative for nausea and vomiting.  Musculoskeletal:  Negative for arthralgias.  Skin:  Negative for color change.  Neurological:  Negative for dizziness, facial asymmetry, speech difficulty, light-headedness, numbness and headaches.      Objective:    Physical Exam Constitutional:      General: She is not in acute distress.    Appearance: Normal appearance. She is normal  weight. She is not ill-appearing, toxic-appearing or diaphoretic.  HENT:     Head: Normocephalic and atraumatic.     Mouth/Throat:     Lips: Pink.     Mouth: Mucous membranes are dry.     Dentition: Abnormal dentition. Dental caries present. No gingival swelling, dental abscesses or gum lesions.     Pharynx: Oropharynx is clear. Uvula midline. No pharyngeal swelling, oropharyngeal exudate or posterior oropharyngeal erythema.     Tonsils: No tonsillar exudate.      Comments: Left molar + for caries. No erythema or exudate in gingival area around tooth.  Cardiovascular:     Rate and Rhythm: Normal rate and regular rhythm.     Pulses: Normal pulses.     Heart sounds: Normal heart sounds.  Pulmonary:     Effort: Pulmonary effort is normal.     Breath sounds: Normal breath sounds.  Musculoskeletal:        General: Normal range of motion.  Skin:    General: Skin is warm and dry.  Neurological:     Mental Status: She is alert and oriented to person, place, and time. Mental status is at baseline.  Psychiatric:        Mood and Affect: Mood normal.        Behavior: Behavior normal.        Thought Content: Thought content normal.        Judgment: Judgment normal.    There were no vitals taken for this visit. Wt Readings from Last 3 Encounters:  11/30/21 135 lb 9.6 oz (61.5 kg)  11/02/20 133 lb (60.3 kg)  02/18/20 135 lb (61.2 kg)    Health Maintenance Due  Topic Date Due   COLONOSCOPY  (Pts 45-50yrs Insurance coverage will need to be confirmed)  06/18/2016   Pneumonia Vaccine 76+ Years old (1 - PCV) Never done   MAMMOGRAM  12/16/2019   COVID-19 Vaccine (4 - Booster for Moderna series) 12/12/2020   INFLUENZA VACCINE  07/19/2021    There are no preventive care reminders to display for this patient.   Lab Results  Component Value Date   TSH 3.060 12/09/2016   Lab Results  Component Value Date   WBC 6.0 04/17/2017   HGB 13.6 04/17/2017   HCT 39.0 04/17/2017   MCV 92.9 04/17/2017   PLT 236 04/17/2017   Lab Results  Component Value Date   NA 134 09/12/2019   K 4.3 09/12/2019   CO2 27 09/12/2019   GLUCOSE 84 09/12/2019   BUN 8 09/12/2019   CREATININE 0.54 (L) 09/12/2019   BILITOT 0.7 09/12/2019   ALKPHOS 55 09/12/2019   AST 24 09/12/2019   ALT 19 09/12/2019   PROT 7.1 09/12/2019   ALBUMIN 4.7 09/12/2019   CALCIUM 9.6 09/12/2019   ANIONGAP 7 04/17/2017   Lab Results  Component Value Date   CHOL 250 (H) 09/12/2019   Lab Results  Component Value Date   HDL 95 09/12/2019   Lab Results  Component Value Date   LDLCALC 135 (H) 09/12/2019   Lab Results  Component Value Date   TRIG 119 09/12/2019   Lab Results  Component Value Date   CHOLHDL 2.6 09/12/2019   No results found for: HGBA1C     Assessment & Plan:   Problem List Items Addressed This Visit   None Visit Diagnoses     Gastroesophageal reflux disease without esophagitis    -  Primary  New Onset/ stable.  More likely acid reflux  vs. Cardiac event.  Discussed education on avoiding spicy foods, decreasing caffeine, and sitting up at least 30 minutes after eating.  Advised to restart Tums as needed for relief.    2. Dental caries      Ongoing.  Pt. Contacting Dentist today to follow up with tooth extraction plan. Prescribed Chlorhexidine mouthwash for irritation and dry mouth.    RTC 1/24 for follow up.    No orders of the defined types were placed in this  encounter.    Terie Purser, NP

## 2022-01-05 ENCOUNTER — Other Ambulatory Visit: Payer: Self-pay

## 2022-01-05 MED ORDER — LOSARTAN POTASSIUM 25 MG PO TABS: 25.0000 mg | ORAL_TABLET | Freq: Two times a day (BID) | ORAL | 1 refills | Status: DC

## 2022-01-05 NOTE — Telephone Encounter (Signed)
Yes, it is fine to change her Losartan to twice daily. Thank you!

## 2022-01-06 ENCOUNTER — Other Ambulatory Visit: Payer: Self-pay | Admitting: Nurse Practitioner

## 2022-01-06 DIAGNOSIS — K029 Dental caries, unspecified: Secondary | ICD-10-CM

## 2022-01-06 MED ORDER — CHLORHEXIDINE GLUCONATE 0.12 % MT SOLN: 5.0000 mL | Freq: Four times a day (QID) | OROMUCOSAL | Status: DC

## 2022-01-06 MED ORDER — HIBICLENS 4 % EX LIQD: Freq: Three times a day (TID) | CUTANEOUS | 1 refills | Status: AC | PRN

## 2022-01-06 NOTE — Addendum Note (Signed)
Addended by: Terie Purser on: 01/06/2022 04:48 PM   Modules accepted: Orders

## 2022-01-11 ENCOUNTER — Other Ambulatory Visit: Payer: Self-pay

## 2022-01-11 ENCOUNTER — Ambulatory Visit: Payer: Self-pay | Admitting: Nurse Practitioner

## 2022-01-11 VITALS — BP 124/82 | HR 82 | Resp 14

## 2022-01-11 DIAGNOSIS — Z1211 Encounter for screening for malignant neoplasm of colon: Secondary | ICD-10-CM | POA: Insufficient documentation

## 2022-01-11 DIAGNOSIS — K029 Dental caries, unspecified: Secondary | ICD-10-CM

## 2022-01-11 DIAGNOSIS — K219 Gastro-esophageal reflux disease without esophagitis: Secondary | ICD-10-CM

## 2022-01-11 NOTE — Progress Notes (Signed)
Acute Office Visit  Subjective:    Patient ID: Rita Berry, female    DOB: 04/03/1956, 66 y.o.   MRN: [id number]  Chief Complaint  Patient presents with   Gastroesophageal Reflux    HPI Patient is in today for follow up for acid reflux and dental caries. Drinks coffee first thing in the morning with no food in stomach, has done it for years and thinks it is starting to affect her throat and is causing the acid reflux. Started using apple cider vinegar gummies and kefir last week prior to drinking coffee and has noticed a difference. She has not restarted her tums, but will be picking up some today.  Denies chest pain or pressure.  She was also scheduled to see her dentist last week regarding her dental caries but cancelled since her symptoms started to decrease. She has been taking her amoxicillin prescribed prior to scheduled tooth extraction in 3 weeks. She denies increased tenderness, pain, or redness in area. She was unable to used prescribed mouthwash from last week d/t backorder issues. She is picking it up today from the pharmacy.   Past Medical History:  Diagnosis Date   Arthritis    hip right    CIN I (cervical intraepithelial neoplasia I)    Complication of anesthesia    Essential hypertension    Humerus distal fracture    left-08-16-16 surgery to repair   Hyperlipidemia    Ovarian cyst    Palpitations    no arrhythmias noted on monitor 2018   PONV (postoperative nausea and vomiting)    Pre-syncope    a. ? orthostasis/dehydration 03/2017.   Vertigo    "Positional vertigo"- head elevation best    Past Surgical History:  Procedure Laterality Date   ABDOMINAL HYSTERECTOMY     ABDOMINAL SURGERY     Ovarian cyst   COLPOSCOPY     GYNECOLOGIC CRYOSURGERY     OOPHORECTOMY     BSO   ORIF HUMERUS FRACTURE Left 08/16/2016   Procedure: OPEN TREATMENT OF LEFT DISTAL HUMERUS FRACTURE;  Surgeon: Mack Hook, MD;  Location: [facility];  Service: Orthopedics;   Laterality: Left;  GENERAL ANESTHESIA WITH PRE-OP BLOCK   TOTAL HIP ARTHROPLASTY Right 10/18/2016   Procedure: RIGHT TOTAL HIP ARTHROPLASTY ANTERIOR APPROACH;  Surgeon: Durene Romans, MD;  Location: [facility];  Service: Orthopedics;  Laterality: Right;   TUBAL LIGATION      Family History  Problem Relation Age of Onset   Hypertension Mother    Dementia Mother    Bradycardia Mother    Hypertension Father    Hyperlipidemia Father    Diabetes Sister        gestational   Diabetes Maternal Grandmother    Stroke Maternal Grandmother    Stroke Maternal Grandfather     Social History   Socioeconomic History   Marital status: Married    Spouse name: Maricela Curet   Number of children: 0   Years of education: EdD   Highest education level: Not on file  Occupational History   Occupation: Therapist, art    Comment: Residents and Haematologist at [employer]  Tobacco Use   Smoking status: Former    Types: Cigarettes    Quit date: 10/10/1990    Years since quitting: 31.2   Smokeless tobacco: Never  Vaping Use   Vaping Use: Former  Substance and Sexual Activity   Alcohol use: Yes    Alcohol/week: 9.0 standard drinks    Types:  5 Glasses of wine, 4 Standard drinks or equivalent per week    Comment: social   Drug use: No   Sexual activity: Not Currently    Partners: Male  Other Topics Concern   Not on file  Social History Narrative   Completed Doctor of Education in Kinesiology from [employer] 04/2013.   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   Lives with her husband.   Social Determinants of Health   Financial Resource Strain: Not on file  Food Insecurity: Not on file  Transportation Needs: Not on file  Physical Activity: Not on file  Stress: Not on file  Social Connections: Not on file  Intimate Partner Violence: Not on file    Outpatient Medications Prior to Visit   Medication Sig Dispense Refill   AMBULATORY NON FORMULARY MEDICATION Baclofen 2% Cream Apply 2g topically two times daily. This is to be compounded with Diclofenac 5%, Gabapentin 6%, Ketamine 10%, Tetracaine 3%. 120 g 1   b complex vitamins tablet Take 1 tablet by mouth daily.     Calcium-Magnesium 500-250 MG TABS Take 1 tablet by mouth 2 (two) times daily.     chlorhexidine (HIBICLENS) 4 % external liquid Apply topically 3 (three) times daily as needed for up to 14 days (dry mouth). 120 mL 1   chlorthalidone (HYGROTON) 25 MG tablet TAKE 1/2 TABLET(12.5 MG) BY MOUTH DAILY 15 tablet 3   Cholecalciferol (VITAMIN D) 2000 units CAPS Take 2,000 Units by mouth daily.     fluticasone (FLONASE) 50 MCG/ACT nasal spray Place 2 sprays into both nostrils daily. 16 g 0   Ketamine HCl POWD APPLY 2 GRAMS TWICE A DAY. 120 g 3   losartan (COZAAR) 25 MG tablet Take 1 tablet (25 mg total) by mouth 2 (two) times daily. 180 tablet 1   Multiple Vitamin (MULTIVITAMIN) tablet Take 1 tablet by mouth daily. Reported on 06/14/2016     rosuvastatin (CRESTOR) 10 MG tablet TAKE 1 TABLET BY MOUTH MONDAY AND FRIDAY ONLY 25 tablet 3   No facility-administered medications prior to visit.    Allergies  Allergen Reactions   Codeine Nausea And Vomiting   Latex Itching    Review of Systems  Constitutional:  Negative for appetite change, chills and fever.  HENT:  Positive for dental problem. Negative for facial swelling.   Respiratory:  Negative for chest tightness and shortness of breath.   Cardiovascular:  Negative for chest pain.  Gastrointestinal:  Negative for nausea.  Skin:  Negative for color change.  Neurological:  Negative for facial asymmetry and numbness.  Hematological:  Negative for adenopathy.      Objective:    Physical Exam Constitutional:      Appearance: Normal appearance. She is normal weight.  HENT:     Head: Normocephalic and atraumatic.     Mouth/Throat:     Mouth: Mucous membranes are  moist.     Dentition: Dental caries present. No gingival swelling or dental abscesses.     Pharynx: Oropharynx is clear. Uvula midline. No posterior oropharyngeal erythema.     Tonsils: No tonsillar exudate.  Cardiovascular:     Rate and Rhythm: Normal rate and regular rhythm.     Pulses: Normal pulses.     Heart sounds: Normal heart sounds.  Pulmonary:     Effort: Pulmonary effort is normal.     Breath sounds: Normal breath sounds.  Abdominal:     General: Abdomen is flat.     Palpations: Abdomen is soft.  Musculoskeletal:     Cervical back: Normal range of motion and neck supple.  Skin:    General: Skin is warm and dry.     Findings: No erythema.  Neurological:     Mental Status: She is alert.    BP 124/82 (BP Location: Left Arm, Patient Position: Sitting, Cuff Size: Normal)    Pulse 82    Resp 14    SpO2 99%  Wt Readings from Last 3 Encounters:  11/30/21 135 lb 9.6 oz (61.5 kg)  11/02/20 133 lb (60.3 kg)  02/18/20 135 lb (61.2 kg)    Health Maintenance Due  Topic Date Due   COLONOSCOPY (Pts 45-25yrs Insurance coverage will need to be confirmed)  06/18/2016   Pneumonia Vaccine 59+ Years old (1 - PCV) Never done   MAMMOGRAM  12/16/2019   COVID-19 Vaccine (4 - Booster for Moderna series) 12/12/2020   INFLUENZA VACCINE  07/19/2021    There are no preventive care reminders to display for this patient.   Lab Results  Component Value Date   TSH 3.060 12/09/2016   Lab Results  Component Value Date   WBC 6.0 04/17/2017   HGB 13.6 04/17/2017   HCT 39.0 04/17/2017   MCV 92.9 04/17/2017   PLT 236 04/17/2017   Lab Results  Component Value Date   NA 134 09/12/2019   K 4.3 09/12/2019   CO2 27 09/12/2019   GLUCOSE 84 09/12/2019   BUN 8 09/12/2019   CREATININE 0.54 (L) 09/12/2019   BILITOT 0.7 09/12/2019   ALKPHOS 55 09/12/2019   AST 24 09/12/2019   ALT 19 09/12/2019   PROT 7.1 09/12/2019   ALBUMIN 4.7 09/12/2019   CALCIUM 9.6 09/12/2019   ANIONGAP 7 04/17/2017    Lab Results  Component Value Date   CHOL 250 (H) 09/12/2019   Lab Results  Component Value Date   HDL 95 09/12/2019   Lab Results  Component Value Date   LDLCALC 135 (H) 09/12/2019   Lab Results  Component Value Date   TRIG 119 09/12/2019   Lab Results  Component Value Date   CHOLHDL 2.6 09/12/2019   No results found for: HGBA1C     Assessment & Plan:   Problem List Items Addressed This Visit   None Visit Diagnoses     Gastroesophageal reflux disease without esophagitis    -  Primary  Ongoing Discussed benefits of eating and taking her apple cider vinegar gummies at least 30 mins prior to drinking coffee in the morning.  Restart Tums today.     2. Dental caries      Ongoing. Tooth extraction of L 2nd molar in 3 weeks.  Continue amoxicillin regimen as prescribed by dentist. Start hibiclens oral mouthwash today.    RTC if symptoms worsen/PRN.    No orders of the defined types were placed in this encounter.    Terie Purser, NP

## 2022-01-21 NOTE — Telephone Encounter (Signed)
LAB ORDERS ENTERED

## 2022-01-28 ENCOUNTER — Other Ambulatory Visit: Payer: Self-pay | Admitting: Student

## 2022-01-28 DIAGNOSIS — I10 Essential (primary) hypertension: Secondary | ICD-10-CM

## 2022-02-07 MED ORDER — LOSARTAN POTASSIUM 25 MG PO TABS: 25.0000 mg | ORAL_TABLET | Freq: Two times a day (BID) | ORAL | 1 refills | Status: DC

## 2022-02-07 NOTE — Addendum Note (Signed)
Addended by: Freddi Starr on: 02/07/2022 08:04 AM   Modules accepted: Orders

## 2022-08-04 ENCOUNTER — Other Ambulatory Visit: Payer: Self-pay | Admitting: Cardiology

## 2022-10-05 ENCOUNTER — Other Ambulatory Visit: Payer: Self-pay | Admitting: Cardiology

## 2022-10-05 DIAGNOSIS — I10 Essential (primary) hypertension: Secondary | ICD-10-CM

## 2023-03-01 ENCOUNTER — Other Ambulatory Visit: Payer: Self-pay | Admitting: Cardiology

## 2023-03-01 DIAGNOSIS — I10 Essential (primary) hypertension: Secondary | ICD-10-CM

## 2023-03-11 ENCOUNTER — Other Ambulatory Visit: Payer: Self-pay | Admitting: Cardiology

## 2023-03-23 ENCOUNTER — Other Ambulatory Visit: Payer: Self-pay | Admitting: Cardiology

## 2023-03-23 DIAGNOSIS — I10 Essential (primary) hypertension: Secondary | ICD-10-CM

## 2023-04-15 ENCOUNTER — Other Ambulatory Visit: Payer: Self-pay | Admitting: Cardiology

## 2023-04-15 DIAGNOSIS — I10 Essential (primary) hypertension: Secondary | ICD-10-CM

## 2023-05-04 ENCOUNTER — Other Ambulatory Visit: Payer: Self-pay | Admitting: Cardiology

## 2023-05-08 ENCOUNTER — Other Ambulatory Visit: Payer: Self-pay | Admitting: Cardiology

## 2023-05-08 DIAGNOSIS — I10 Essential (primary) hypertension: Secondary | ICD-10-CM

## 2023-06-29 ENCOUNTER — Ambulatory Visit: Payer: Medicare PPO | Attending: Student | Admitting: Physician Assistant

## 2023-06-29 ENCOUNTER — Encounter: Payer: Self-pay | Admitting: Physician Assistant

## 2023-06-29 ENCOUNTER — Ambulatory Visit (INDEPENDENT_AMBULATORY_CARE_PROVIDER_SITE_OTHER): Payer: Medicare PPO

## 2023-06-29 ENCOUNTER — Other Ambulatory Visit: Payer: Self-pay | Admitting: Cardiology

## 2023-06-29 VITALS — BP 124/76 | HR 71 | Ht 68.0 in | Wt 145.6 lb

## 2023-06-29 DIAGNOSIS — R002 Palpitations: Secondary | ICD-10-CM

## 2023-06-29 DIAGNOSIS — I10 Essential (primary) hypertension: Secondary | ICD-10-CM

## 2023-06-29 MED ORDER — CHLORTHALIDONE 25 MG PO TABS
ORAL_TABLET | ORAL | 3 refills | Status: DC
Start: 2023-06-29 — End: 2024-04-18

## 2023-06-29 MED ORDER — ROSUVASTATIN CALCIUM 10 MG PO TABS: ORAL_TABLET | ORAL | 3 refills | Status: DC

## 2023-06-29 NOTE — Progress Notes (Unsigned)
Enrolled patient for a 14 day Zio XT monitor to be mailed to patients home  Rita Berry to read 

## 2023-06-29 NOTE — Progress Notes (Signed)
Cardiology Office Note:  .   Date:  06/29/2023  ID:  NHUNG DANKO, DOB [birth year], MRN [id number] PCP: Porfirio Oar, PA  [facility] Providers Cardiologist:  Peter Swaziland, MD     History of Present Illness: .   ORION VANDERVORT is a 67 y.o. female with past medical history of palpitation, hypertension, and hyperlipidemia.  Previous heart monitor in 2018 showed no significant arrhythmia.  Patient was initially seen in December 2017 for evaluation of dizziness in the setting of low blood pressure.  She had tolerated chlorthalidone for hypertension for years however developed side effects and the low blood pressure when Nitropatch was added to increase perfusion prior to hip surgery.  Chlorthalidone was discontinued and she was started on clonidine as needed for blood pressure spikes.  She later came off focal mitral patch and was restarted on chlorthalidone.  Losartan was later added for blood pressure control.  Echocardiogram obtained for heart murmur on 12/07/2020 showed EF 65 to 70%, grade 1 DD, extenuated Doppler signal through the outflow tract which likely is the source of his heart murmur.  Patient was last seen by Marjie Skiff, PA-C on 11/30/2021.  Blood pressure was well-controlled at the time.    Patient presents today for follow-up along with her husband.  For the past 6 months, she has been having what she describes as a hiccup in her right chest that occurs every other day.  Sometimes it is associated with dizziness/presyncope, but most of the time it will self resolve.  She denies any chest discomfort.  She has great exercise ability for her stated age and is able to exercise for 8 miles on a daily basis without any issue.  She does not have any exertional symptoms.  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  I recommended a 2-week heart monitor.  We can follow-up with her in 6 to 8 weeks for the monitor result.  If everything is reassuring, she can resume yearly follow-up after  that.  ROS:   She denies chest pain, palpitations, dyspnea, pnd, orthopnea, n, v, dizziness, syncope, edema, weight gain, or early satiety. All other systems reviewed and are otherwise negative except as noted above.    Studies Reviewed: .         Cardiac Studies & Procedures       ECHOCARDIOGRAM  ECHOCARDIOGRAM COMPLETE 12/15/2020  Narrative ECHOCARDIOGRAM REPORT    Patient Name:   TANISH SINKLER Corradi  Date of Exam: 12/15/2020 Medical Rec #:  [id number]     Height:       67.0 in Accession #:    [id number]    Weight:       133.0 lb Date of Birth:  April 12, 1956     BSA:          1.700 m Patient Age:    64 years      BP:           153/87 mmHg Patient Gender: F             HR:           81 bpm. Exam Location:  [address]  Procedure: 2D Echo, Cardiac Doppler and Color Doppler  Indications:    R01.1 Murmur  History:        Patient has no prior history of Echocardiogram examinations. Signs/Symptoms:Murmur; Risk Factors:Hypertension, Former Smoker and Dyslipidemia.  Sonographer:    Farrel Conners RDCS Referring Phys: [id number] CALLIE E GOODRICH  IMPRESSIONS   1.  Accelerated Doppler signal through outflow tract (likely source of murmur). No evidence of outflow tract obstruction. Left ventricular ejection fraction, by estimation, is 65 to 70%. The left ventricle has normal function. The left ventricle has no regional wall motion abnormalities. Left ventricular diastolic parameters are consistent with Grade I diastolic dysfunction (impaired relaxation). 2. Right ventricular systolic function is normal. The right ventricular size is normal. 3. The mitral valve is normal in structure. No evidence of mitral valve regurgitation. No evidence of mitral stenosis. 4. The aortic valve is normal in structure. Aortic valve regurgitation is not visualized. No aortic stenosis is present. 5. The inferior vena cava is normal in size with greater than 50% respiratory variability, suggesting right  atrial pressure of 3 mmHg.  FINDINGS Left Ventricle: Accelerated Doppler signal through outflow tract (likely source of murmur). No evidence of outflow tract obstruction. Left ventricular ejection fraction, by estimation, is 65 to 70%. The left ventricle has normal function. The left ventricle has no regional wall motion abnormalities. The left ventricular internal cavity size was normal in size. There is no left ventricular hypertrophy. Left ventricular diastolic parameters are consistent with Grade I diastolic dysfunction (impaired relaxation).  Right Ventricle: The right ventricular size is normal. No increase in right ventricular wall thickness. Right ventricular systolic function is normal.  Left Atrium: Left atrial size was normal in size.  Right Atrium: Right atrial size was normal in size.  Pericardium: There is no evidence of pericardial effusion.  Mitral Valve: The mitral valve is normal in structure. No evidence of mitral valve regurgitation. No evidence of mitral valve stenosis.  Tricuspid Valve: The tricuspid valve is normal in structure. Tricuspid valve regurgitation is not demonstrated. No evidence of tricuspid stenosis.  Aortic Valve: The aortic valve is normal in structure. Aortic valve regurgitation is not visualized. No aortic stenosis is present.  Pulmonic Valve: The pulmonic valve was normal in structure. Pulmonic valve regurgitation is not visualized. No evidence of pulmonic stenosis.  Aorta: The aortic root is normal in size and structure.  Venous: The inferior vena cava is normal in size with greater than 50% respiratory variability, suggesting right atrial pressure of 3 mmHg.  IAS/Shunts: No atrial level shunt detected by color flow Doppler.   LEFT VENTRICLE PLAX 2D LVIDd:         3.70 cm  Diastology LVIDs:         2.00 cm  LV e' medial:    7.51 cm/s LV PW:         0.80 cm  LV E/e' medial:  10.3 LV IVS:        0.70 cm  LV e' lateral:   8.39 cm/s LVOT diam:      2.10 cm  LV E/e' lateral: 9.2 LV SV:         100 LV SV Index:   59 LVOT Area:     3.46 cm   RIGHT VENTRICLE RV S prime:     12.90 cm/s TAPSE (M-mode): 2.3 cm  LEFT ATRIUM             Index       RIGHT ATRIUM           Index LA diam:        3.00 cm 1.76 cm/m  RA Area:     17.10 cm LA Vol (A2C):   41.0 ml 24.11 ml/m RA Volume:   41.40 ml  24.35 ml/m LA Vol (A4C):   29.8 ml 17.53 ml/m LA  Biplane Vol: 37.3 ml 21.94 ml/m AORTIC VALVE LVOT Vmax:   135.00 cm/s LVOT Vmean:  87.700 cm/s LVOT VTI:    0.289 m  AORTA Ao Root diam: 3.50 cm Ao Asc diam:  3.50 cm  MITRAL VALVE MV Area (PHT): cm         SHUNTS MV Decel Time: 217 msec    Systemic VTI:  0.29 m MV E velocity: 77.10 cm/s  Systemic Diam: 2.10 cm MV A velocity: 87.40 cm/s MV E/A ratio:  0.88  Donato Schultz MD Electronically signed by Donato Schultz MD Signature Date/Time: 12/15/2020/2:49:22 PM    Final    MONITORS  CARDIAC EVENT MONITOR 12/28/2016  Narrative  Normal sinus rhythm  No arrhythmia seen            Risk Assessment/Calculations:             Physical Exam:   VS:  BP 124/76   Pulse 71   Ht 5\' 8"  (1.727 m)   Wt 145 lb 9.6 oz (66 kg)   SpO2 99%   BMI 22.14 kg/m    Wt Readings from Last 3 Encounters:  06/29/23 145 lb 9.6 oz (66 kg)  11/30/21 135 lb 9.6 oz (61.5 kg)  11/02/20 133 lb (60.3 kg)    GEN: Well nourished, well developed in no acute distress NECK: No JVD; No carotid bruits CARDIAC: RRR, no murmurs, rubs, gallops RESPIRATORY:  Clear to auscultation without rales, wheezing or rhonchi  ABDOMEN: Soft, non-tender, non-distended EXTREMITIES:  No edema; No deformity   ASSESSMENT AND PLAN: .    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  Will order a 2-week heart monitor to assess PVC burden.  Hypertension: Blood pressure stable       Dispo: Follow-up in 1 year if heart monitor is reassuring  Signed, Azalee Course, PA

## 2023-06-29 NOTE — Patient Instructions (Addendum)
Medication Instructions:  Your physician recommends that you continue on your current medications as directed. Please refer to the Current Medication list given to you today.  *If you need a refill on your cardiac medications before your next appointment, please call your pharmacy*   Lab Work: None ordered  If you have labs (blood work) drawn today and your tests are completely normal, you will receive your results only by: MyChart Message (if you have MyChart) OR A paper copy in the mail If you have any lab test that is abnormal or we need to change your treatment, we will call you to review the results.   Testing/Procedures: Rita Berry- Long Term Monitor Instructions  Your physician has requested you wear a ZIO patch monitor for 14 days.  This is a single patch monitor. [employer] supplies one patch monitor per enrollment. Additional stickers are not available. Please do not apply patch if you will be having a Nuclear Stress Test,  Echocardiogram, Cardiac CT, MRI, or Chest Xray during the period you would be wearing the  monitor. The patch cannot be worn during these tests. You cannot remove and re-apply the  ZIO XT patch monitor.  Your ZIO patch monitor will be mailed 3 day USPS to your address on file. It may take 3-5 days  to receive your monitor after you have been enrolled.  Once you have received your monitor, please review the enclosed instructions. Your monitor  has already been registered assigning a specific monitor serial # to you.  Billing and Patient Assistance Program Information  We have supplied [employer] with any of your insurance information on file for billing purposes. [employer] offers a sliding scale Patient Assistance Program for patients that do not have  insurance, or whose insurance does not completely cover the cost of the ZIO monitor.  You must apply for the Patient Assistance Program to qualify for this discounted rate.  To apply, please call [employer] at  [phone], select option 4, select option 2, ask to apply for  Patient Assistance Program. Meredeth Ide will ask your household income, and how many people  are in your household. They will quote your out-of-pocket cost based on that information.  [employer] will also be able to set up a 36-month, interest-free payment plan if needed.  Applying the monitor   Shave hair from upper left chest.  Hold abrader disc by orange tab. Rub abrader in 40 strokes over the upper left chest as  indicated in your monitor instructions.  Clean area with 4 enclosed alcohol pads. Let dry.  Apply patch as indicated in monitor instructions. Patch will be placed under collarbone on left  side of chest with arrow pointing upward.  Rub patch adhesive wings for 2 minutes. Remove white label marked "1". Remove the white  label marked "2". Rub patch adhesive wings for 2 additional minutes.  While looking in a mirror, press and release button in center of patch. A small green light will  flash 3-4 times. This will be your only indicator that the monitor has been turned on.  Do not shower for the first 24 hours. You may shower after the first 24 hours.  Press the button if you feel a symptom. You will hear a small click. Record Date, Time and  Symptom in the Patient Logbook.  When you are ready to remove the patch, follow instructions on the last 2 pages of Patient  Logbook. Stick patch monitor onto the last page of Patient Logbook.  Place Patient  Logbook in the blue and white box. Use locking tab on box and tape box closed  securely. The blue and white box has prepaid postage on it. Please place it in the mailbox as  soon as possible. Your physician should have your test results approximately 7 days after the  monitor has been mailed back to [facility].  Call [facility] Customer Care at [phone] if you have questions regarding  your ZIO XT patch monitor. Call them immediately if you see an orange light  blinking on your  monitor.  If your monitor falls off in less than 4 days, contact our Monitor department at [phone].  If your monitor becomes loose or falls off after 4 days call [employer] at [phone] for  suggestions on securing your monitor    Follow-Up: At [facility], you and your health needs are our priority.  As part of our continuing mission to provide you with exceptional heart care, we have created designated Provider Care Teams.  These Care Teams include your primary Cardiologist (physician) and Advanced Practice Providers (APPs -  Physician Assistants and Nurse Practitioners) who all work together to provide you with the care you need, when you need it.  We recommend signing up for the patient portal called "MyChart".  Sign up information is provided on this After Visit Summary.  MyChart is used to connect with patients for Virtual Visits (Telemedicine).  Patients are able to view lab/test results, encounter notes, upcoming appointments, etc.  Non-urgent messages can be sent to your provider as well.   To learn more about what you can do with MyChart, go to [email or web address].    Your next appointment:   6-8  WEEKS  Provider:   Azalee Course, PA-C        Other Instructions

## 2023-07-01 DIAGNOSIS — I10 Essential (primary) hypertension: Secondary | ICD-10-CM

## 2023-07-01 DIAGNOSIS — R002 Palpitations: Secondary | ICD-10-CM | POA: Diagnosis not present

## 2023-07-24 ENCOUNTER — Ambulatory Visit: Payer: PRIVATE HEALTH INSURANCE | Admitting: Student

## 2023-07-27 ENCOUNTER — Other Ambulatory Visit: Payer: Self-pay | Admitting: Cardiology

## 2023-08-02 ENCOUNTER — Ambulatory Visit: Payer: PRIVATE HEALTH INSURANCE | Admitting: Physician Assistant

## 2023-08-02 ENCOUNTER — Ambulatory Visit: Payer: Medicare PPO | Admitting: Physician Assistant

## 2023-08-02 ENCOUNTER — Encounter: Payer: Self-pay | Admitting: Physician Assistant

## 2023-08-02 NOTE — Progress Notes (Signed)
This encounter was created in error - please disregard.

## 2023-08-10 ENCOUNTER — Ambulatory Visit: Payer: PRIVATE HEALTH INSURANCE | Admitting: Physician Assistant

## 2023-09-27 DIAGNOSIS — I10 Essential (primary) hypertension: Secondary | ICD-10-CM

## 2023-09-27 DIAGNOSIS — E785 Hyperlipidemia, unspecified: Secondary | ICD-10-CM

## 2023-09-27 DIAGNOSIS — Z136 Encounter for screening for cardiovascular disorders: Secondary | ICD-10-CM

## 2023-10-04 ENCOUNTER — Ambulatory Visit (HOSPITAL_COMMUNITY)
Admission: RE | Admit: 2023-10-04 | Discharge: 2023-10-04 | Disposition: A | Discharge status: Home / Self Care | Payer: Medicare PPO | Source: Ambulatory Visit | Attending: Cardiology | Admitting: Cardiology

## 2023-10-04 DIAGNOSIS — I10 Essential (primary) hypertension: Secondary | ICD-10-CM | POA: Insufficient documentation

## 2023-10-04 DIAGNOSIS — E785 Hyperlipidemia, unspecified: Secondary | ICD-10-CM | POA: Insufficient documentation

## 2023-10-04 DIAGNOSIS — Z136 Encounter for screening for cardiovascular disorders: Secondary | ICD-10-CM | POA: Insufficient documentation

## 2023-10-12 ENCOUNTER — Other Ambulatory Visit: Payer: Self-pay | Admitting: *Deleted

## 2023-10-12 ENCOUNTER — Other Ambulatory Visit: Payer: Self-pay

## 2023-10-12 DIAGNOSIS — E785 Hyperlipidemia, unspecified: Secondary | ICD-10-CM

## 2023-10-12 DIAGNOSIS — I10 Essential (primary) hypertension: Secondary | ICD-10-CM

## 2023-10-12 MED ORDER — ROSUVASTATIN CALCIUM 10 MG PO TABS
10.0000 mg | ORAL_TABLET | Freq: Every day | ORAL | 3 refills | Status: DC
Start: 2023-10-12 — End: 2024-10-08

## 2024-04-17 ENCOUNTER — Other Ambulatory Visit: Payer: Self-pay | Admitting: Student

## 2024-04-17 DIAGNOSIS — I10 Essential (primary) hypertension: Secondary | ICD-10-CM

## 2024-05-15 ENCOUNTER — Other Ambulatory Visit: Payer: Self-pay | Admitting: Physician Assistant

## 2024-07-01 ENCOUNTER — Other Ambulatory Visit: Payer: Self-pay | Admitting: Cardiology

## 2024-07-01 DIAGNOSIS — I10 Essential (primary) hypertension: Secondary | ICD-10-CM

## 2024-07-27 ENCOUNTER — Other Ambulatory Visit: Payer: Self-pay | Admitting: Cardiology

## 2024-10-03 ENCOUNTER — Other Ambulatory Visit: Payer: Self-pay | Admitting: Cardiology

## 2024-10-03 ENCOUNTER — Other Ambulatory Visit: Payer: Self-pay | Admitting: Physician Assistant

## 2024-10-03 DIAGNOSIS — I10 Essential (primary) hypertension: Secondary | ICD-10-CM

## 2024-10-05 ENCOUNTER — Other Ambulatory Visit: Payer: Self-pay | Admitting: Cardiology

## 2024-10-07 MED ORDER — CHLORTHALIDONE 25 MG PO TABS: 12.5000 mg | ORAL_TABLET | Freq: Every day | ORAL | 0 refills | Status: AC

## 2024-10-30 ENCOUNTER — Other Ambulatory Visit: Payer: Self-pay | Admitting: Cardiology
